# Patient Record
Sex: Male | Born: 1975 | Race: Black or African American | Hispanic: No | Marital: Single | Smoking: Never smoker
Health system: Southern US, Community
[De-identification: ages and names within clinical notes are randomized; demographics above are authoritative.]

## PROBLEM LIST (undated history)

## (undated) DIAGNOSIS — L309 Dermatitis, unspecified: Secondary | ICD-10-CM

## (undated) DIAGNOSIS — S92902A Unspecified fracture of left foot, initial encounter for closed fracture: Secondary | ICD-10-CM

## (undated) DIAGNOSIS — G43909 Migraine, unspecified, not intractable, without status migrainosus: Secondary | ICD-10-CM

## (undated) DIAGNOSIS — Z91018 Allergy to other foods: Secondary | ICD-10-CM

## (undated) DIAGNOSIS — Z9109 Other allergy status, other than to drugs and biological substances: Secondary | ICD-10-CM

## (undated) DIAGNOSIS — J45909 Unspecified asthma, uncomplicated: Secondary | ICD-10-CM

## (undated) DIAGNOSIS — R011 Cardiac murmur, unspecified: Secondary | ICD-10-CM

## (undated) HISTORY — DX: Migraine, unspecified, not intractable, without status migrainosus: G43.909

## (undated) HISTORY — DX: Dermatitis, unspecified: L30.9

## (undated) HISTORY — DX: Unspecified fracture of left foot, initial encounter for closed fracture: S92.902A

---
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
## 1999-10-31 ENCOUNTER — Emergency Department (HOSPITAL_COMMUNITY): Admission: EM | Admit: 1999-10-31 | Discharge: 1999-10-31 | Payer: Self-pay | Admitting: Emergency Medicine

## 1999-11-01 ENCOUNTER — Encounter: Payer: Self-pay | Admitting: Emergency Medicine

## 1999-11-07 ENCOUNTER — Encounter: Payer: Self-pay | Admitting: Emergency Medicine

## 1999-11-07 ENCOUNTER — Emergency Department (HOSPITAL_COMMUNITY): Admission: EM | Admit: 1999-11-07 | Discharge: 1999-11-07 | Payer: Self-pay | Admitting: Emergency Medicine

## 2000-09-26 ENCOUNTER — Inpatient Hospital Stay (HOSPITAL_COMMUNITY): Admission: EM | Admit: 2000-09-26 | Discharge: 2000-09-28 | Payer: Self-pay

## 2000-10-07 ENCOUNTER — Encounter: Admission: RE | Admit: 2000-10-07 | Discharge: 2000-10-07 | Payer: Self-pay

## 2001-05-28 ENCOUNTER — Emergency Department (HOSPITAL_COMMUNITY): Admission: EM | Admit: 2001-05-28 | Discharge: 2001-05-28 | Payer: Self-pay | Admitting: Emergency Medicine

## 2001-05-28 ENCOUNTER — Encounter: Payer: Self-pay | Admitting: Emergency Medicine

## 2003-02-19 ENCOUNTER — Emergency Department (HOSPITAL_COMMUNITY): Admission: EM | Admit: 2003-02-19 | Discharge: 2003-02-20 | Payer: Self-pay | Admitting: Emergency Medicine

## 2003-02-20 ENCOUNTER — Encounter: Payer: Self-pay | Admitting: Emergency Medicine

## 2004-04-10 ENCOUNTER — Emergency Department (HOSPITAL_COMMUNITY): Admission: EM | Admit: 2004-04-10 | Discharge: 2004-04-10 | Payer: Self-pay | Admitting: Emergency Medicine

## 2004-08-09 ENCOUNTER — Emergency Department (HOSPITAL_COMMUNITY): Admission: EM | Admit: 2004-08-09 | Discharge: 2004-08-09 | Payer: Self-pay | Admitting: *Deleted

## 2007-03-31 ENCOUNTER — Emergency Department (HOSPITAL_COMMUNITY): Admission: EM | Admit: 2007-03-31 | Discharge: 2007-03-31 | Payer: Self-pay | Admitting: Family Medicine

## 2007-11-05 ENCOUNTER — Emergency Department (HOSPITAL_COMMUNITY): Admission: EM | Admit: 2007-11-05 | Discharge: 2007-11-06 | Payer: Self-pay | Admitting: Orthopedic Surgery

## 2007-12-20 ENCOUNTER — Emergency Department (HOSPITAL_COMMUNITY): Admission: EM | Admit: 2007-12-20 | Discharge: 2007-12-20 | Payer: Self-pay | Admitting: Emergency Medicine

## 2010-11-12 ENCOUNTER — Inpatient Hospital Stay (INDEPENDENT_AMBULATORY_CARE_PROVIDER_SITE_OTHER)
Admission: RE | Admit: 2010-11-12 | Discharge: 2010-11-12 | Disposition: A | Discharge status: Home / Self Care | Payer: Self-pay | Source: Ambulatory Visit | Attending: Family Medicine | Admitting: Family Medicine

## 2010-11-12 ENCOUNTER — Ambulatory Visit (INDEPENDENT_AMBULATORY_CARE_PROVIDER_SITE_OTHER): Payer: Self-pay

## 2010-11-12 DIAGNOSIS — S92309A Fracture of unspecified metatarsal bone(s), unspecified foot, initial encounter for closed fracture: Secondary | ICD-10-CM

## 2010-12-29 ENCOUNTER — Inpatient Hospital Stay (INDEPENDENT_AMBULATORY_CARE_PROVIDER_SITE_OTHER)
Admission: RE | Admit: 2010-12-29 | Discharge: 2010-12-29 | Disposition: A | Discharge status: Home / Self Care | Payer: Self-pay | Source: Ambulatory Visit | Attending: Emergency Medicine | Admitting: Emergency Medicine

## 2010-12-29 DIAGNOSIS — L259 Unspecified contact dermatitis, unspecified cause: Secondary | ICD-10-CM

## 2010-12-29 DIAGNOSIS — J45909 Unspecified asthma, uncomplicated: Secondary | ICD-10-CM

## 2011-03-26 LAB — POCT I-STAT, CHEM 8
HCT: 45
Hemoglobin: 15.3
Potassium: 3.3 — ABNORMAL LOW
Sodium: 142
TCO2: 27

## 2011-03-26 LAB — CBC
Hemoglobin: 13.6
RBC: 5.22
WBC: 12.4 — ABNORMAL HIGH

## 2011-03-26 LAB — DIFFERENTIAL
Basophils Relative: 1
Lymphocytes Relative: 43
Monocytes Absolute: 1
Monocytes Relative: 8
Neutro Abs: 5.5
Neutrophils Relative %: 44

## 2011-03-26 LAB — PROTIME-INR: INR: 0.9

## 2011-03-26 LAB — SAMPLE TO BLOOD BANK

## 2011-03-26 LAB — APTT: aPTT: 23 — ABNORMAL LOW

## 2011-12-05 ENCOUNTER — Encounter (HOSPITAL_COMMUNITY): Payer: Self-pay | Admitting: *Deleted

## 2011-12-05 ENCOUNTER — Emergency Department (INDEPENDENT_AMBULATORY_CARE_PROVIDER_SITE_OTHER)
Admission: EM | Admit: 2011-12-05 | Discharge: 2011-12-05 | Disposition: A | Discharge status: Home / Self Care | Payer: Self-pay | Source: Home / Self Care | Attending: Emergency Medicine | Admitting: Emergency Medicine

## 2011-12-05 DIAGNOSIS — Z760 Encounter for issue of repeat prescription: Secondary | ICD-10-CM

## 2011-12-05 DIAGNOSIS — L301 Dyshidrosis [pompholyx]: Secondary | ICD-10-CM

## 2011-12-05 HISTORY — DX: Unspecified asthma, uncomplicated: J45.909

## 2011-12-05 MED ORDER — TRIAMCINOLONE ACETONIDE 0.5 % EX OINT: TOPICAL_OINTMENT | Freq: Two times a day (BID) | CUTANEOUS | Status: DC

## 2011-12-05 MED ORDER — ALBUTEROL SULFATE HFA 108 (90 BASE) MCG/ACT IN AERS: 2.0000 | INHALATION_SPRAY | Freq: Four times a day (QID) | RESPIRATORY_TRACT | Status: DC | PRN

## 2011-12-05 MED ORDER — CEPHALEXIN 500 MG PO CAPS: 500.0000 mg | ORAL_CAPSULE | Freq: Four times a day (QID) | ORAL | Status: AC

## 2011-12-05 MED ORDER — METHYLPREDNISOLONE 4 MG PO KIT: PACK | ORAL | Status: AC

## 2011-12-05 MED ORDER — HYDROXYZINE HCL 50 MG PO TABS: 50.0000 mg | ORAL_TABLET | Freq: Three times a day (TID) | ORAL | Status: AC | PRN

## 2011-12-05 NOTE — ED Provider Notes (Signed)
History     CSN: [id number]  Arrival date & time 12/05/11  1154   First MD Initiated Contact with Patient 12/05/11 1241      Chief Complaint  Patient presents with  . Rash    (Consider location/radiation/quality/duration/timing/severity/associated sxs/prior treatment) HPI Comments: Patient history of eczema presents with worsening of his chronic, itchy hand rash. States that initially started on his right hand, but has now spread to his left. 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 He washes dishes for living, but dates that this does not make it better or worse. States that the itching feels better when soaking his hands in very hot water. He is not tried anything else for his symptoms. No nausea, vomiting, redness, paresthesias, limitation of motion of the fingers of his hand. He has had similar, silvery plaques on his elbows since childhood. Does have a history of eczema and asthma. He's also requesting a refill of albuterol inhaler.   ROS as noted in HPI. All other ROS negative.   Patient is a 36 y.o. male presenting with rash. The history is provided by the patient. No language interpreter was used.  Rash  This is a chronic problem. The current episode started more than 1 week ago. The problem has been gradually worsening. The problem is associated with an unknown factor. There has been no fever. The rash is present on the left hand and right hand. Associated symptoms include itching. Pertinent negatives include no blisters, no pain and no weeping. He has tried steriods for the symptoms. The treatment provided moderate relief.    Past Medical History  Diagnosis Date  . Asthma     History reviewed. No pertinent past surgical history.  History reviewed. No pertinent family history.  History  Substance Use Topics  .  Smoking status: Current Some Day Smoker  . Smokeless tobacco: Not on file  . Alcohol Use: No      Review of Systems  Skin: Positive for itching and rash.    Allergies  Review of patient's allergies indicates no known allergies.  Home Medications   Current Outpatient Rx  Name Route Sig Dispense Refill  . ALBUTEROL SULFATE HFA 108 (90 BASE) MCG/ACT IN AERS Inhalation Inhale 2 puffs into the lungs every 6 (six) hours as needed. 1 Inhaler 0  . CEPHALEXIN 500 MG PO CAPS Oral Take 1 capsule (500 mg total) by mouth 4 (four) times daily. X 10 days 40 capsule 0  . HYDROXYZINE HCL 50 MG PO TABS Oral Take 1 tablet (50 mg total) by mouth 3 (three) times daily as needed for itching. 30 tablet 0  . METHYLPREDNISOLONE 4 MG PO KIT  follow package directions 21 tablet 0  . TRIAMCINOLONE ACETONIDE 0.5 % EX OINT Topical Apply topically 2 (two) times daily. X 2 weeks 30 g 0    BP 124/70  Pulse 72  Temp(Src) 98.6 F (37 C) (Oral)  Resp 16  SpO2 100%  Physical Exam  Nursing note and vitals reviewed. Constitutional: He is oriented to person, place, and time. He appears well-developed and well-nourished.  HENT:  Head: Normocephalic and atraumatic.  Eyes: Conjunctivae and EOM are normal.  Neck: Normal range of motion.  Cardiovascular: Normal rate.   Pulmonary/Chest: Effort normal. No respiratory distress.  Abdominal: He exhibits no distension.  Musculoskeletal: Normal range of motion.  Neurological:  He is alert and oriented to person, place, and time.  Skin: Skin is warm and dry. Rash noted.       Cracked, silvery, scaly skin on the ring and little finger, similar patches on left hand.No signs of infection on the hands. No redness, purulent drainage. No limitation of motion. Sensation grossly intact. Refill less than 2 seconds. Silvery patches c/w ezcema on the forearms, neck. No excoriations.   Psychiatric: He has a normal mood and affect. His behavior is normal.    ED Course  Procedures  (including critical care time)  Labs Reviewed - No data to display No results found.   1. Dyshidrotic eczema   2. Medication refill       MDM  Previous records are being. As noted in history of present illness.  Pt to use thin cotton gloves when wearing gloves. He is to stop soaking hands in hot water. He home with triamcinolone, oral steroids and antihistamines for the itching, Keflex for secondary infection. Also refilling albuterol.  Luiz Blare, MD 12/05/11 2040

## 2011-12-05 NOTE — Discharge Instructions (Signed)
We're thin cotton gloves when you're wearing rubber gloves. You must keep your hands as dry as you can stop soaking your hands in hot water as discussed. Take the medication as written. You will need to see a dermatologist for ongoing management. Return if you have a fever above 100.4, fevers, or other concerns.

## 2011-12-05 NOTE — ED Notes (Signed)
Pt  Has  Dry    Cracked  Skin     Both  Hands    r  Worse  Than  The  Left  -  Pt  Has   ecxyma     In past  Seen   Last  Month  For  Same  Given  Cream  Not  Helping  Pt  Reports  That  He  Also  Has  Asthma   And  Needs a  rx  For  Albuterol       -  He  Is  Sitting  Upright on the  Exam table      In no  Severe  Distress

## 2012-01-13 ENCOUNTER — Encounter: Payer: Self-pay | Admitting: Sports Medicine

## 2012-01-13 ENCOUNTER — Ambulatory Visit (INDEPENDENT_AMBULATORY_CARE_PROVIDER_SITE_OTHER): Payer: Self-pay | Admitting: Sports Medicine

## 2012-01-13 VITALS — BP 124/75 | HR 83 | Temp 97.8°F | Ht 71.0 in | Wt 250.1 lb

## 2012-01-13 DIAGNOSIS — J45909 Unspecified asthma, uncomplicated: Secondary | ICD-10-CM | POA: Insufficient documentation

## 2012-01-13 DIAGNOSIS — L301 Dyshidrosis [pompholyx]: Secondary | ICD-10-CM

## 2012-01-13 DIAGNOSIS — Z0000 Encounter for general adult medical examination without abnormal findings: Secondary | ICD-10-CM

## 2012-01-13 DIAGNOSIS — D179 Benign lipomatous neoplasm, unspecified: Secondary | ICD-10-CM

## 2012-01-13 MED ORDER — TRIAMCINOLONE ACETONIDE 0.5 % EX OINT: TOPICAL_OINTMENT | Freq: Two times a day (BID) | CUTANEOUS | Status: DC

## 2012-01-13 MED ORDER — FLUOCINOLONE ACETONIDE 0.01 % EX OIL: TOPICAL_OIL | Freq: Two times a day (BID) | CUTANEOUS | Status: DC

## 2012-01-13 MED ORDER — ALBUTEROL SULFATE HFA 108 (90 BASE) MCG/ACT IN AERS: 2.0000 | INHALATION_SPRAY | Freq: Four times a day (QID) | RESPIRATORY_TRACT | Status: DC | PRN

## 2012-01-13 MED ORDER — AEROCHAMBER MAX W/MASK LARGE MISC: 1.0000 | Freq: Once | Status: DC

## 2012-01-13 MED ORDER — BECLOMETHASONE DIPROPIONATE 80 MCG/ACT IN AERS: 1.0000 | INHALATION_SPRAY | RESPIRATORY_TRACT | Status: DC | PRN

## 2012-01-13 MED ORDER — FLUOCINOLONE ACETONIDE 0.01 % EX OIL: TOPICAL_OIL | Freq: Two times a day (BID) | CUTANEOUS | Status: AC

## 2012-01-13 NOTE — Patient Instructions (Addendum)
It was nice to meet you today.  The growths are called lipomas.  They are a overgrowth of fat cells that are benign (non-cancerous).  I have sent in 2 steroids for your hands.  Only use one of these but you may try both to see which you prefer.  For your asthma I have sent in a new prescription for a controller medicine as well as a refill for your albuterol.  Please use a spacer to take these medications each time you use them.  Please return in 1-2 weeks to make a lab appointment.  Please call the day before you would like to come in to schedule a lab draw.  Do not eat anything after midnight the night before; you may drink only water prior to the blood draw.  Please schedule a follow up appointment in 1-2 months to check on how your asthma is doing and to discuss the results of your lab work.

## 2012-01-22 ENCOUNTER — Telehealth: Payer: Self-pay | Admitting: Sports Medicine

## 2012-01-22 DIAGNOSIS — L301 Dyshidrosis [pompholyx]: Secondary | ICD-10-CM

## 2012-01-22 DIAGNOSIS — J45909 Unspecified asthma, uncomplicated: Secondary | ICD-10-CM

## 2012-01-22 NOTE — Telephone Encounter (Signed)
Called pt to find out which Rx's are to be sent and he is requesting that all of them are sent to [facility]. These will have to be faxed or called in. Forwarded to pcp.Loralee Pacas [place]

## 2012-01-22 NOTE — Telephone Encounter (Signed)
Pt recently got [employer], now wants all his prescriptions sent to [facility].

## 2012-01-25 ENCOUNTER — Encounter: Payer: Self-pay | Admitting: Sports Medicine

## 2012-01-25 DIAGNOSIS — D179 Benign lipomatous neoplasm, unspecified: Secondary | ICD-10-CM | POA: Insufficient documentation

## 2012-01-25 NOTE — Assessment & Plan Note (Signed)
Will represcribed steroids for hand and arm eczema.  Encourage patient to have a good moisturizing routine

## 2012-01-25 NOTE — Assessment & Plan Note (Addendum)
Moderate persistent asthma and adult.  Will prescribe controller medicine with a spacer, red flax reviewed

## 2012-01-25 NOTE — Assessment & Plan Note (Signed)
Discussed red flax.  Lesions or not changing per patient report.  Nonpainful.

## 2012-01-25 NOTE — Progress Notes (Signed)
  [facility]  Patient name: Adrian [name] MRN [id number]  Date of birth: [birth year]  CC & HPI  Adrian [name] is a 36 y.o. male presenting today to establish care as an outpatient.  History to establish with the orange card and was referred by Rudell Cobb.   He was referred for severe headaches, eczema and asthma.  Yes reports that he is then more anxious in his mood has been depressed.  Denies any homicidal or suicidal ideations and wishes to defer medications at this time.  Reports severe itching of his hands that required evaluation the urgent care.  Reports a history of asthma and has wheezing frequently, coughing frequently, tightness approximately every day.  He denies is waking her up from sleep.  Uses an albuterol inhaler daily when he has one but is currently out.  History has small bumps underneath the skin that he would like to be evaluated and reports occasional palpitations that he has been struggling with for prolonged time.  He reports these are worse with his anxiety.   ROS  Per history of present illness  Pertinent History Reviewed  Medical & Surgical Hx:  Reviewed: Significant for asthma, migraines, palpitations Medications: Reviewed & Updated - see associated section Social History: Reviewed and updated - see section  Objective Findings  Vitals:  Filed Vitals:   01/13/12 1347  BP: 124/75  Pulse: 83  Temp: 97.8 F (36.6 C)    PE: GENERAL:  Adult  male.  Examined in [facility].  In no discomfort; norespiratory distress.   PSYCH: Alert and appropriately interactive; Insight:Good   GAD 7 - Difficulty: Very difficult 1 2 3 4 5 6 7  Total:  2 3 3 3 2 3 3 19   PHQ-9 - Difficulty: Very Difficult 1 2 3 4 5 6 7 8 9  Total:  2 3 3 3 1 2 1 1 2 18       H&N: AT/[place], MMM, no scleral icterus, EOMi THORAX: HEART: RRR, S1/S2 heard, no murmur LUNGS: CTA B, no wheezes, no crackles EXTREMITIES: Moves all 4 extremities spontaneously, warm well perfused, no  edema, bilateral DP and PT pulses 2/4.   Skin: Scattered subcutaneous nodules approximately half a centimeter to 1 cm of of his forearm and upper back.  Consistent with lipomas    Assessment & Plan

## 2012-01-25 NOTE — Assessment & Plan Note (Signed)
Maintenance labs drawn

## 2012-01-26 ENCOUNTER — Telehealth: Payer: Self-pay | Admitting: *Deleted

## 2012-01-26 MED ORDER — BECLOMETHASONE DIPROPIONATE 80 MCG/ACT IN AERS: 1.0000 | INHALATION_SPRAY | RESPIRATORY_TRACT | Status: AC | PRN

## 2012-01-26 MED ORDER — ALBUTEROL SULFATE HFA 108 (90 BASE) MCG/ACT IN AERS: 2.0000 | INHALATION_SPRAY | Freq: Four times a day (QID) | RESPIRATORY_TRACT | Status: DC | PRN

## 2012-01-26 MED ORDER — AEROCHAMBER MAX W/MASK LARGE MISC: 1.0000 | Freq: Once | Status: DC

## 2012-01-26 MED ORDER — TRIAMCINOLONE ACETONIDE 0.5 % EX OINT: TOPICAL_OINTMENT | Freq: Two times a day (BID) | CUTANEOUS | Status: DC

## 2012-01-26 MED ORDER — TRIAMCINOLONE 0.1 % CREAM:EUCERIN CREAM 1:1: 1.0000 "application " | TOPICAL_CREAM | Freq: Two times a day (BID) | CUTANEOUS | Status: DC

## 2012-01-26 NOTE — Telephone Encounter (Signed)
[facility] pharmacy calling about patient's triamcinolone ointment Rx.  Patient has Rx at [facility] and is requesting to pick med up from [facility] pharmacy, but they only have 0.1% cream.  If ok to change, patient will need new Rx called or faxed to their pharmacy.  Will route note to Dr. Berline Chough and call back.  Gaylene Brooks, RN

## 2012-01-26 NOTE — Telephone Encounter (Signed)
Reprinted and sent with other rx

## 2012-01-26 NOTE — Telephone Encounter (Signed)
Reprinted.  Will have faxed

## 2012-05-19 ENCOUNTER — Ambulatory Visit (INDEPENDENT_AMBULATORY_CARE_PROVIDER_SITE_OTHER): Payer: Self-pay | Admitting: Family Medicine

## 2012-05-19 ENCOUNTER — Encounter: Payer: Self-pay | Admitting: Family Medicine

## 2012-05-19 ENCOUNTER — Ambulatory Visit: Payer: Self-pay | Admitting: Family Medicine

## 2012-05-19 VITALS — BP 127/77 | HR 56 | Temp 99.5°F | Ht 71.0 in | Wt 252.0 lb

## 2012-05-19 DIAGNOSIS — K089 Disorder of teeth and supporting structures, unspecified: Secondary | ICD-10-CM

## 2012-05-19 DIAGNOSIS — L301 Dyshidrosis [pompholyx]: Secondary | ICD-10-CM

## 2012-05-19 DIAGNOSIS — J45909 Unspecified asthma, uncomplicated: Secondary | ICD-10-CM

## 2012-05-19 DIAGNOSIS — K0889 Other specified disorders of teeth and supporting structures: Secondary | ICD-10-CM | POA: Insufficient documentation

## 2012-05-19 MED ORDER — TRIAMCINOLONE ACETONIDE 0.1 % EX OINT: TOPICAL_OINTMENT | Freq: Two times a day (BID) | CUTANEOUS | Status: DC

## 2012-05-19 MED ORDER — ALBUTEROL SULFATE HFA 108 (90 BASE) MCG/ACT IN AERS: 2.0000 | INHALATION_SPRAY | Freq: Four times a day (QID) | RESPIRATORY_TRACT | Status: DC | PRN

## 2012-05-19 NOTE — Assessment & Plan Note (Signed)
No evidence of infection.  Will place referral for chipped tooth.

## 2012-05-19 NOTE — Patient Instructions (Addendum)
Will put in a dental referral.  May take a while  Brush teeth 2-3 times daily and floss every day

## 2012-05-19 NOTE — Assessment & Plan Note (Signed)
Will refill triamcinolone 

## 2012-05-19 NOTE — Assessment & Plan Note (Signed)
Well controlled.  Will refill albuterol.

## 2012-05-19 NOTE — Progress Notes (Signed)
  Subjective:    Patient ID: Adrian [name], male    DOB: Nov 13, 1975, 36 y.o.   MRN: [id number]  HPI  Work in appt for dental pain  Dental pain:  Several days ago chipped front bottom tooth on the inside.  Has been sore and notes his tongue gets caught on sharp edge.  No fever, chills, drainage, swelling.  Eczema: needs refill of steroid cream.  Had been doing well before he ran out  Asthma:  Requests refill of albuterols.  Reports being compliant qiht qvar, needing albuterol only in limited use.  No tobacco use.  occ mj use.  I have reviewed patient's  PMH, FH, and Social history and Medications as related to this visit. Denies smoking Review of Systems See hpi    Objective:   Physical Exam GEN: Alert & Oriented, No acute distress CV:  Regular Rate & Rhythm, no murmur Respiratory:  Normal work of breathing, CTAB Abd:  + BS, soft, no tenderness to palpation Ext: no pre-tibial edema Tooth;  Small crack medial portion of lower front tooth on the back.  No gum swelling.  No evidence of pulpitis.       Assessment & Plan:

## 2013-02-15 ENCOUNTER — Emergency Department (INDEPENDENT_AMBULATORY_CARE_PROVIDER_SITE_OTHER)
Admission: EM | Admit: 2013-02-15 | Discharge: 2013-02-15 | Disposition: A | Discharge status: Home / Self Care | Payer: Self-pay | Source: Home / Self Care | Attending: Emergency Medicine | Admitting: Emergency Medicine

## 2013-02-15 ENCOUNTER — Encounter (HOSPITAL_COMMUNITY): Payer: Self-pay | Admitting: *Deleted

## 2013-02-15 DIAGNOSIS — S0550XA Penetrating wound with foreign body of unspecified eyeball, initial encounter: Secondary | ICD-10-CM

## 2013-02-15 DIAGNOSIS — T1511XA Foreign body in conjunctival sac, right eye, initial encounter: Secondary | ICD-10-CM

## 2013-02-15 MED ORDER — POLYMYXIN B-TRIMETHOPRIM 10000-0.1 UNIT/ML-% OP SOLN: 1.0000 [drp] | OPHTHALMIC | Status: DC

## 2013-02-15 NOTE — ED Notes (Signed)
Pt  Reports  He  Was   Working  With  Fiberglass    sev  Days  Ago  And  May  Have  Gotten  Something in r  Eye  -  It is  Red  painfull  And irritated  He  Reports  He  Attempted  To  Irrigate the  Eye  With  Visine  But it  Hurt too  Much

## 2013-02-15 NOTE — ED Provider Notes (Signed)
Chief Complaint:   Chief Complaint  Patient presents with  . Eye Problem    History of Present Illness:   Adrian [name] is a 37 year old male who was working with some fiberglass several days ago when he thinks he got some fiberglass in his eye. This was his right eye. He has a foreign body sensation in that eye. His vision is somewhat blurry. The eye has been red and watering it feels irritated. He flushed his eye out copiously with lukewarm water and tried some Visine drops, but neither these did much good.  Review of Systems:  Other than noted above, the patient denies any of the following symptoms: Systemic:  No fever, chills, sweats, fatigue, or weight loss. Eye:  No redness, eye pain, photophobia, discharge, blurred vision, or diplopia. ENT:  No nasal congestion, rhinorrhea, or sore throat. Lymphatic:  No adenopathy. Skin:  No rash or pruritis.  PMFSH:  Past medical history, family history, social history, meds, and allergies were reviewed.  He has asthma and eczema and uses albuterol and triamcinolone.  Physical Exam:   Vital signs:  BP 125/84  Pulse 45  Temp(Src) 97.2 F (36.2 C) (Oral)  Resp 16  SpO2 98% General:  Alert and in no distress. Eye:  Lids appear normal. Conjunctiva was not injected. Conjunctival sac was searched extensively for foreign body and none was found. The upper lid was everted and appears normal. Cornea was intact to inspection and to fluorescein staining. Anterior chamber was normal. Lobe appeared normal. PERRLA, full EOMs, fundi benign. ENT:  TMs and canals clear.  Nasal mucosa normal.  No intra-oral lesions, mucous membranes moist, pharynx clear. Neck:  No adenopathy tenderness or mass. Skin:  Clear, warm and dry.  Assessment:  The encounter diagnosis was Foreign body in conjunctival sac, right, initial encounter.  No evidence of foreign body. I think he may have gotten some fiberglass in his eye but his tears washed it out. I suggest he followup with  ophthalmology if symptoms persist.  Plan:   1.  The following meds were prescribed:   Discharge Medication List as of 02/15/2013  7:27 PM    START taking these medications   Details  trimethoprim-polymyxin b (POLYTRIM) ophthalmic solution Place 1 drop into the right eye every 4 (four) hours., Starting 02/15/2013, Until Discontinued, Normal       2.  The patient was instructed in symptomatic care and handouts were given. 3.  The patient was told to return if becoming worse in any way, if no better in 2 or 3 days, and given some red flag symptoms such as ongoing eye discomfort or changes in his vision that would indicate earlier return. 4.  Follow up with Dr. Wayna Chalet if no better in 2 or 3 days.     Reuben Likes, MD 02/15/13 [id number]

## 2013-02-15 NOTE — Discharge Instructions (Signed)
Eye Foreign Body It is common during physical activity for a foreign body to enter and stay in the eye. This may result in scratching (abrasion) of the eye, vision loss, eye pain, or the sensation of an object in the eye, causing teardrops to develop. Wearing eye protection significantly decreases the chance of a foreign body entering the eye. However, athletes often neglect to wear eye protection. SYMPTOMS   Irritation when blinking.  Decreased vision.  Eye pain.  Swelling of eye lids. CAUSES   Contact between finger and the eye.  Contact between athletic equipment and the eye.  Dirt and grass in the eye.  Windblown debris in the eye.  Insect in the eye. RISK INCREASES WITH:  Elvin So and dusty conditions.  Lack of protective eye gear.  Lack of protective headgear, including face masks. PREVENTION   Avoid use of guns or toys that release projectiles or air.  Wear protective eyewear, such as polycarbonate lenses.  Wear protective headgear. TREATMENT  Before seeking medical attention, you may flush the eyes out with lukewarm water. This may flush the foreign body out of the eye. If flushing does not work, you need to see a caregiver for a thorough eye exam. The caregiver will examine your eye for a foreign body, and for the possibility of a penetrating body within the globe of the eye. If your caregiver is not able to remove the foreign body in his or her office, surgery may be necessary. Finally, antibiotics and anti-inflammatory drops may be prescribed. Depending on the injury, a rigid shield or eye patch may be applied.  Document Released: 06/16/2005 Document Revised: 09/08/2011 Document Reviewed: 09/28/2008 [facility] Patient Information 2014 [place], [place].

## 2013-04-20 ENCOUNTER — Encounter (HOSPITAL_COMMUNITY): Payer: Self-pay | Admitting: Emergency Medicine

## 2013-04-20 ENCOUNTER — Emergency Department (INDEPENDENT_AMBULATORY_CARE_PROVIDER_SITE_OTHER)
Admission: EM | Admit: 2013-04-20 | Discharge: 2013-04-20 | Disposition: A | Discharge status: Home / Self Care | Payer: Self-pay | Source: Home / Self Care

## 2013-04-20 DIAGNOSIS — J029 Acute pharyngitis, unspecified: Secondary | ICD-10-CM

## 2013-04-20 DIAGNOSIS — J45909 Unspecified asthma, uncomplicated: Secondary | ICD-10-CM

## 2013-04-20 HISTORY — DX: Allergy to other foods: Z91.018

## 2013-04-20 HISTORY — DX: Other allergy status, other than to drugs and biological substances: Z91.09

## 2013-04-20 MED ORDER — AZITHROMYCIN 250 MG PO TABS: ORAL_TABLET | ORAL | Status: DC

## 2013-04-20 MED ORDER — POCKET SPACER DEVI: 1.0000 | Freq: Once | Status: DC

## 2013-04-20 MED ORDER — ALBUTEROL SULFATE (5 MG/ML) 0.5% IN NEBU
2.5000 mg | INHALATION_SOLUTION | Freq: Once | RESPIRATORY_TRACT | Status: AC
  Administered 2013-04-20: 2.5 mg via RESPIRATORY_TRACT

## 2013-04-20 MED ORDER — ALBUTEROL SULFATE HFA 108 (90 BASE) MCG/ACT IN AERS: 2.0000 | INHALATION_SPRAY | Freq: Four times a day (QID) | RESPIRATORY_TRACT | Status: DC | PRN

## 2013-04-20 MED ORDER — IPRATROPIUM BROMIDE 0.02 % IN SOLN
0.5000 mg | RESPIRATORY_TRACT | Status: DC
  Administered 2013-04-20: 0.5 mg via RESPIRATORY_TRACT

## 2013-04-20 MED ORDER — ALBUTEROL SULFATE (5 MG/ML) 0.5% IN NEBU
INHALATION_SOLUTION | RESPIRATORY_TRACT | Status: AC
  Filled 2013-04-20: qty 1

## 2013-04-20 MED ORDER — SODIUM CHLORIDE 0.9 % IJ SOLN
INTRAMUSCULAR | Status: AC
  Filled 2013-04-20: qty 3

## 2013-04-20 MED ORDER — IPRATROPIUM BROMIDE 0.02 % IN SOLN
RESPIRATORY_TRACT | Status: AC
  Filled 2013-04-20: qty 2.5

## 2013-04-20 NOTE — ED Notes (Signed)
Post peak flow-560,490,500- 560 best of 3.

## 2013-04-20 NOTE — ED Notes (Addendum)
C/o sore throat onset Fri., then he started coughing, runny nose, fatigue-like he's going to pass out, chills, fever, chest tight. States he has asthma and feels like an asthma attack is coming on. Has used his Albuteral inhaler.  Cough is productive of yellowish brown sputum.  Pt. audibly wheezing on expiration.

## 2013-04-20 NOTE — ED Notes (Signed)
Pre tx. 360, 345, 320- 360 best of 3.

## 2013-04-20 NOTE — ED Provider Notes (Signed)
CSN: [id number]     Arrival date & time 04/20/13  1823 History   None    Chief Complaint  Patient presents with  . Influenza   HPI 37 year male with known history of moderate asthma, ran out of inhalers, presents with five-day history since 04/16/13 of significant cough, coryza, myalgias, mild sputum. He states that he has small children at home but none of them are sick-his girlfriend coughed his face and he thinks that he may contracted this from her. She has been sick for 2 weeks and is going [facility] the [facility] to [facility] to get evaluated. He does not have your chart anymore as is unemployed and hence came here for medical care. He states that he has had no ear pain either nor any headache or chest pain. He does feel short of breath and feels occasionally like his has a because he cannot catch his breath.   Past Medical History  Diagnosis Date  . Asthma   . Eczema   . Migraine   . Foot fracture, left   . Multiple food allergies     fish, tomatoes, peanuts, eggs, chicken and others  . Environmental allergies     grass   Past Surgical History  Procedure Laterality Date  . Inguinal hernia repair  1994   Family History  Problem Relation Age of Onset  . Asthma Father   . Clotting disorder      Aunt  . Breast cancer      Grandmother  . Prostate cancer      uncle  . Diabetes Mother   . Hypertension Mother   . Hyperlipidemia      grandparent  . Hypertension      grandparent/grandparent  . Kidney disease      aunt/uncle/grandparent  . Stroke      grandparent   History  Substance Use Topics  . Smoking status: Current Some Day Smoker    Types: Cigars  . Smokeless tobacco: Not on file  . Alcohol Use: No    Review of Systems See above in addition no chest pain no nausea no vomiting Positive diarrhea couple of episodes couple of days ago.   Allergies  Review of patient's allergies indicates no known allergies.  Home Medications   Current Outpatient  Rx  Name  Route  Sig  Dispense  Refill  . albuterol (PROVENTIL HFA;VENTOLIN HFA) 108 (90 BASE) MCG/ACT inhaler   Inhalation   Inhale 2 puffs into the lungs every 6 (six) hours as needed.   1 Inhaler   2   . triamcinolone ointment (KENALOG) 0.1 %   Topical   Apply topically 2 (two) times daily. As needed   454 g   0   . albuterol (PROVENTIL HFA;VENTOLIN HFA) 108 (90 BASE) MCG/ACT inhaler   Inhalation   Inhale 2 puffs into the lungs every 6 (six) hours as needed.   1 Inhaler   1   . Spacer/Aero-Holding Chambers (AEROCHAMBER MAX [facility]) MISC   Other   1 each by Other route once.   1 each   3   . trimethoprim-polymyxin b (POLYTRIM) ophthalmic solution   Right Eye   Place 1 drop into the right eye every 4 (four) hours.   10 mL   0    There were no vitals taken for this visit. Physical Exam Alert obese pleasant African American male in mild respiratory distress. No accessory muscle use EOMI, funduscopy deferred Throat seems slightly injected Ears  are clear with TMs having no air-fluid levels No submandibular lymphadenopathy Chest has wheezes bilaterally No tactile vocal resonance/fremitus Abdomen soft nontender  ED Course  Procedures (including critical care time) Labs Review Labs Reviewed - No data to display Imaging Review No results found.  EKG Interpretation     Ventricular Rate:    PR Interval:    QRS Duration:   QT Interval:    QTC Calculation:   R Axis:     Text Interpretation:              MDM  No diagnosis found. Long detailed explanation explained to the patient that this is probably viral pharyngitis, not the flu and that he would probably not benefit from Tamiflu nor antibiotics-patient insists that he wishes antibiotics. I am happy to prescribe him a Z-Pak if he so desires and we will also refill his albuterol as he has run out. I mentioned to him that he will need to have a spacer for use of the albuterol, He also may benefit  from a peak flow meter at home which are prescribed He will benefit from [facility] followup  Rhetta Mura, MD 04/20/13 1947

## 2013-08-03 ENCOUNTER — Emergency Department (INDEPENDENT_AMBULATORY_CARE_PROVIDER_SITE_OTHER)
Admission: EM | Admit: 2013-08-03 | Discharge: 2013-08-03 | Disposition: A | Discharge status: Home / Self Care | Payer: Self-pay | Source: Home / Self Care | Attending: Emergency Medicine | Admitting: Emergency Medicine

## 2013-08-03 ENCOUNTER — Encounter (HOSPITAL_COMMUNITY): Payer: Self-pay | Admitting: Emergency Medicine

## 2013-08-03 DIAGNOSIS — J069 Acute upper respiratory infection, unspecified: Secondary | ICD-10-CM

## 2013-08-03 LAB — POCT RAPID STREP A: Streptococcus, Group A Screen (Direct): NEGATIVE

## 2013-08-03 MED ORDER — GUAIFENESIN 100 MG/5ML PO SYRP: 100.0000 mg | ORAL_SOLUTION | ORAL | Status: DC | PRN

## 2013-08-03 MED ORDER — PSEUDOEPHEDRINE HCL 60 MG PO TABS
60.0000 mg | ORAL_TABLET | Freq: Three times a day (TID) | ORAL | Status: DC | PRN
Start: 2013-08-03 — End: 2013-08-27

## 2013-08-03 NOTE — ED Notes (Signed)
Pt c/o cold sxs onset Sunday Sxs include: runny nose, ST, chills, BA, diarrhea Hx of asthma... Has been using albuterol Denies f/v/n, SOB, wheezing Alert w/no signs of acute distress.

## 2013-08-03 NOTE — ED Provider Notes (Signed)
CSN: [id number]     Arrival date & time 08/03/13  4967 History   First MD Initiated Contact with Patient 08/03/13 0932     Chief Complaint  Patient presents with  . URI   (Consider location/radiation/quality/duration/timing/severity/associated sxs/prior Treatment) Patient is a 38 y.o. male presenting with URI. The history is provided by the patient.  URI Presenting symptoms: congestion, cough, fever, rhinorrhea and sore throat   Presenting symptoms: no ear pain   Severity:  Moderate Onset quality:  Gradual Duration:  4 days Timing:  Constant Progression:  Worsening Chronicity:  New Relieved by:  None tried Worsened by:  Nothing tried Ineffective treatments:  None tried Associated symptoms: myalgias   Associated symptoms: no headaches   Risk factors comment:  Asthma  Adrian [name] is a 38 y.o. male who presents to the UC with flu like symptoms. He has not taken his temperature but felt really hot and then started sweating and having chills. He used his albuterol inhaler a couple days ago but none since then.   Past Medical History  Diagnosis Date  . Asthma   . Eczema   . Migraine   . Foot fracture, left   . Multiple food allergies     fish, tomatoes, peanuts, eggs, chicken and others  . Environmental allergies     grass   Past Surgical History  Procedure Laterality Date  . Inguinal hernia repair  1994   Family History  Problem Relation Age of Onset  . Asthma Father   . Clotting disorder      Aunt  . Breast cancer      Grandmother  . Prostate cancer      uncle  . Diabetes Mother   . Hypertension Mother   . Hyperlipidemia      grandparent  . Hypertension      grandparent/grandparent  . Kidney disease      aunt/uncle/grandparent  . Stroke      grandparent   History  Substance Use Topics  . Smoking status: Current Some Day Smoker    Types: Cigars  . Smokeless tobacco: Not on file  . Alcohol Use: No    Review of Systems  Constitutional: Positive for  fever and chills.  HENT: Positive for congestion, rhinorrhea and sore throat. Negative for ear pain.   Respiratory: Positive for cough.   Gastrointestinal: Negative for nausea, vomiting and abdominal pain.  Genitourinary: Negative for dysuria and frequency.  Musculoskeletal: Positive for myalgias.  Skin: Negative for rash.  Neurological: Negative for dizziness, syncope and headaches.  Psychiatric/Behavioral: Negative for confusion. The patient is not nervous/anxious.     Allergies  Review of patient's allergies indicates no known allergies.  Home Medications   Current Outpatient Rx  Name  Route  Sig  Dispense  Refill  . albuterol (PROVENTIL HFA;VENTOLIN HFA) 108 (90 BASE) MCG/ACT inhaler   Inhalation   Inhale 2 puffs into the lungs every 6 (six) hours as needed.   1 Inhaler   2   . albuterol (PROVENTIL HFA;VENTOLIN HFA) 108 (90 BASE) MCG/ACT inhaler   Inhalation   Inhale 2 puffs into the lungs every 6 (six) hours as needed.   1 Inhaler   1   . azithromycin (ZITHROMAX Z-PAK) 250 MG tablet      Take 2 tablets on the first day and then one tablet daily until  finished   6 each   0   . Spacer/Aero-Holding Chambers (AEROCHAMBER MAX [facility]) MISC   Other   1  each by Other route once.   1 each   3   . Spacer/Aero-Holding Chambers (POCKET SPACER) DEVI   Does not apply   1 each by Does not apply route once.   1 each   0   . triamcinolone ointment (KENALOG) 0.1 %   Topical   Apply topically 2 (two) times daily. As needed   454 g   0   . trimethoprim-polymyxin b (POLYTRIM) ophthalmic solution   Right Eye   Place 1 drop into the right eye every 4 (four) hours.   10 mL   0    BP 119/74  Pulse 61  Temp(Src) 97.8 F (36.6 C) (Oral)  Resp 18  SpO2 98% Physical Exam  Nursing note and vitals reviewed. Constitutional: He is oriented to person, place, and time. He appears well-developed and well-nourished. No distress.  HENT:  Head: Normocephalic.  Right Ear:  Tympanic membrane normal.  Left Ear: Tympanic membrane normal.  Nose: Mucosal edema and rhinorrhea present.  Mouth/Throat: Uvula is midline, oropharynx is clear and moist and mucous membranes are normal.  Eyes: EOM are normal.  Neck: Neck supple.  Cardiovascular: Normal rate.   Pulmonary/Chest: Effort normal. He has no wheezes. He has no rales.  Musculoskeletal: Normal range of motion.  Lymphadenopathy:    He has no cervical adenopathy.  Neurological: He is alert and oriented to person, place, and time. No cranial nerve deficit.  Skin: Skin is warm and dry.  Psychiatric: He has a normal mood and affect. His behavior is normal.    ED Course  Procedures Results for orders placed during the hospital encounter of 08/03/13 (from the past 24 hour(s))  POCT RAPID STREP A ([facility] ONLY)     Status: None   Collection Time    08/03/13  9:40 AM      Result Value Range   Streptococcus, Group A Screen (Direct) NEGATIVE  NEGATIVE    MDM  38 y.o. male with cough and congestion. Will treat for URI. Stable for discharge without any immediate complications. Vital signs normal and 02 SAT 98% on R/A. Discussed with patient clinical and lab findings and plan of care. Patient voices understanding. He will return as needed for worsening symptoms.     Medication List    TAKE these medications       guaifenesin 100 MG/5ML syrup  Commonly known as:  ROBITUSSIN  Take 5-10 mLs (100-200 mg total) by mouth every 4 (four) hours as needed for cough.     pseudoephedrine 60 MG tablet  Commonly known as:  SUDAFED  Take 1 tablet (60 mg total) by mouth every 8 (eight) hours as needed for congestion.      ASK your doctor about these medications       aerochamber max w/mask-large Misc  1 each by Other route once.     POCKET SPACER Devi  1 each by Does not apply route once.     albuterol 108 (90 BASE) MCG/ACT inhaler  Commonly known as:  PROVENTIL HFA;VENTOLIN HFA  Inhale 2 puffs into the lungs every  6 (six) hours as needed.     albuterol 108 (90 BASE) MCG/ACT inhaler  Commonly known as:  PROVENTIL HFA;VENTOLIN HFA  Inhale 2 puffs into the lungs every 6 (six) hours as needed.     azithromycin 250 MG tablet  Commonly known as:  ZITHROMAX Z-PAK  Take 2 tablets on the first day and then one tablet daily until  finished  triamcinolone ointment 0.1 %  Commonly known as:  KENALOG  Apply topically 2 (two) times daily. As needed     trimethoprim-polymyxin b ophthalmic solution  Commonly known as:  POLYTRIM  Place 1 drop into the right eye every 4 (four) hours.           [place], [place] 08/03/13 [phone]

## 2013-08-03 NOTE — Discharge Instructions (Signed)
Cough, Adult  A cough is a reflex. It helps you clear your throat and airways. A cough can help heal your body. A cough can last 2 or 3 weeks (acute) or may last more than 8 weeks (chronic). Some common causes of a cough can include an infection, allergy, or a cold. HOME CARE  Only take medicine as told by your doctor.  If given, take your medicines (antibiotics) as told. Finish them even if you start to feel better.  Use a cold steam vaporizer or humidier in your home. This can help loosen thick spit (secretions).  Sleep so you are almost sitting up (semi-upright). Use pillows to do this. This helps reduce coughing.  Rest as needed.  Stop smoking if you smoke. GET HELP RIGHT AWAY IF:  You have yellowish-white fluid (pus) in your thick spit.  Your cough gets worse.  Your medicine does not reduce coughing, and you are losing sleep.  You cough up blood.  You have trouble breathing.  Your pain gets worse and medicine does not help.  You have a fever. MAKE SURE YOU:   Understand these instructions.  Will watch your condition.  Will get help right away if you are not doing well or get worse. Document Released: 02/27/2011 Document Revised: 09/08/2011 Document Reviewed: 02/27/2011 [facility] Patient Information 2014 [place].  Cool Mist Vaporizers Vaporizers may help relieve the symptoms of a cough and cold. They add moisture to the air, which helps mucus to become thinner and less sticky. This makes it easier to breathe and cough up secretions. Cool mist vaporizers do not cause serious burns like hot mist vaporizers ("steamers, humidifiers"). Vaporizers have not been proved to show they help with colds. You should not use a vaporizer if you are allergic to mold.  HOME CARE INSTRUCTIONS  Follow the package instructions for the vaporizer.  Do not use anything other than distilled water in the vaporizer.  Do not run the vaporizer all of the time. This can cause mold or  bacteria to grow in the vaporizer.  Clean the vaporizer after each time it is used.  Clean and dry the vaporizer well before storing it.  Stop using the vaporizer if worsening respiratory symptoms develop. Document Released: 03/13/2004 Document Revised: 02/16/2013 Document Reviewed: 11/03/2012 [facility] [place] Patient Information 2014 [place], [place].

## 2013-08-03 NOTE — ED Provider Notes (Signed)
Medical screening examination/treatment/procedure(s) were performed by non-physician practitioner and as supervising physician I was immediately available for consultation/collaboration.  Philipp Deputy, M.D.   Harden Mo, MD 08/03/13 [phone]

## 2013-08-05 LAB — CULTURE, GROUP A STREP

## 2013-08-27 ENCOUNTER — Emergency Department (HOSPITAL_COMMUNITY)
Admission: EM | Admit: 2013-08-27 | Discharge: 2013-08-27 | Disposition: A | Discharge status: Home / Self Care | Payer: Self-pay | Attending: Emergency Medicine | Admitting: Emergency Medicine

## 2013-08-27 ENCOUNTER — Encounter (HOSPITAL_COMMUNITY): Payer: Self-pay | Admitting: Emergency Medicine

## 2013-08-27 DIAGNOSIS — J45909 Unspecified asthma, uncomplicated: Secondary | ICD-10-CM | POA: Insufficient documentation

## 2013-08-27 DIAGNOSIS — Z8679 Personal history of other diseases of the circulatory system: Secondary | ICD-10-CM | POA: Insufficient documentation

## 2013-08-27 DIAGNOSIS — R21 Rash and other nonspecific skin eruption: Secondary | ICD-10-CM

## 2013-08-27 DIAGNOSIS — Z8781 Personal history of (healed) traumatic fracture: Secondary | ICD-10-CM | POA: Insufficient documentation

## 2013-08-27 DIAGNOSIS — L2089 Other atopic dermatitis: Secondary | ICD-10-CM | POA: Insufficient documentation

## 2013-08-27 DIAGNOSIS — L209 Atopic dermatitis, unspecified: Secondary | ICD-10-CM

## 2013-08-27 DIAGNOSIS — Z79899 Other long term (current) drug therapy: Secondary | ICD-10-CM | POA: Insufficient documentation

## 2013-08-27 DIAGNOSIS — F17200 Nicotine dependence, unspecified, uncomplicated: Secondary | ICD-10-CM | POA: Insufficient documentation

## 2013-08-27 MED ORDER — FLUTICASONE PROPIONATE 0.05 % EX CREA: TOPICAL_CREAM | Freq: Two times a day (BID) | CUTANEOUS | Status: DC

## 2013-08-27 MED ORDER — CETAPHIL MOISTURIZING EX CREA: TOPICAL_CREAM | CUTANEOUS | Status: DC | PRN

## 2013-08-27 MED ORDER — ALBUTEROL SULFATE HFA 108 (90 BASE) MCG/ACT IN AERS
2.0000 | INHALATION_SPRAY | Freq: Once | RESPIRATORY_TRACT | Status: AC
  Administered 2013-08-27: 2 via RESPIRATORY_TRACT
  Filled 2013-08-27: qty 6.7

## 2013-08-27 NOTE — ED Provider Notes (Signed)
CSN: [id number]     Arrival date & time 08/27/13  0726 History  This chart was scribed for non-physician practitioner, Clayton Bibles, PA-C working with Hoy Morn, MD by Frederich Balding, ED scribe. This patient was seen in room TR11C/TR11C and the patient's care was started at 9:14 AM.   Chief Complaint  Patient presents with  . Rash   The history is provided by the patient. No language interpreter was used.   HPI Comments: Adrian [name] is a 38 y.o. male who presents to the Emergency Department complaining of an itchy rash to his bilateral hands that started several years ago. Pt states he was in prison in 2010 when the rash started and states there was a staph infection going around the prison. Pt is unsure if that is what the rash is. He was previously evaluated for it at Urgent Care, told it was eczema and given triamcinolone cream with some relief. Pt has put Vaseline on the rash with little relief. He states he is also having some joint pain in his hands from the rash. Denies fever, visual disturbance, eye pain, cough, SOB, chest pain. Pt ran out of his inhaler 2 weeks ago and is requesting a refill. He states he uses it about 2 times per week. Denies any current asthma symptoms.   Past Medical History  Diagnosis Date  . Asthma   . Eczema   . Migraine   . Foot fracture, left   . Multiple food allergies     fish, tomatoes, peanuts, eggs, chicken and others  . Environmental allergies     grass   Past Surgical History  Procedure Laterality Date  . Inguinal hernia repair  1994   Family History  Problem Relation Age of Onset  . Asthma Father   . Clotting disorder      Aunt  . Breast cancer      Grandmother  . Prostate cancer      uncle  . Diabetes Mother   . Hypertension Mother   . Hyperlipidemia      grandparent  . Hypertension      grandparent/grandparent  . Kidney disease      aunt/uncle/grandparent  . Stroke      grandparent   History  Substance Use Topics  .  Smoking status: Current Some Day Smoker    Types: Cigars  . Smokeless tobacco: Not on file  . Alcohol Use: No    Review of Systems  Constitutional: Negative for fever.  Eyes: Negative for pain and visual disturbance.  Respiratory: Negative for cough and shortness of breath.   Cardiovascular: Negative for chest pain.  Musculoskeletal: Positive for arthralgias.  Skin: Positive for rash.  All other systems reviewed and are negative.   Allergies  Peanut-containing drug products; Shellfish allergy; and Tomato  Home Medications   Current Outpatient Rx  Name  Route  Sig  Dispense  Refill  . albuterol (PROVENTIL HFA;VENTOLIN HFA) 108 (90 BASE) MCG/ACT inhaler   Inhalation   Inhale 2 puffs into the lungs every 6 (six) hours as needed for wheezing or shortness of breath.         Marland Kitchen ibuprofen (ADVIL,MOTRIN) 200 MG tablet   Oral   Take 400 mg by mouth every 6 (six) hours as needed (pain).         . Spacer/Aero-Holding Chambers (AEROCHAMBER MAX [facility]) MISC   Other   1 each by Other route once.   1 each   3   .  triamcinolone ointment (KENALOG) 0.1 %   Topical   Apply 1 application topically daily as needed (eczema).           BP 152/85  Pulse 60  Temp(Src) 97.4 F (36.3 C) (Oral)  Resp 18  SpO2 97%  Physical Exam  Nursing note and vitals reviewed. Constitutional: He appears well-developed and well-nourished. No distress.  HENT:  Head: Normocephalic and atraumatic.  Neck: Neck supple.  Pulmonary/Chest: Effort normal and breath sounds normal. No respiratory distress. He has no wheezes. He has no rhonchi. He has no rales.  Neurological: He is alert.  Skin: He is not diaphoretic.  Chronically thickened skin over the dorsal aspect of all fingers of bilateral hands with lichenification. Small splitting of the skin over the fifth PIP without erythema, edema, warmth or discharge. No tenderness. Small areas of scaling over the right palm.     ED Course  Procedures  (including critical care time)  DIAGNOSTIC STUDIES: Oxygen Saturation is 97% on RA, normal by my interpretation.    COORDINATION OF CARE: 9:18 AM-Discussed treatment plan which includes dermatology follow up with pt at bedside and pt agreed to plan.   Labs Review Labs Reviewed - No data to display Imaging Review No results found.   EKG Interpretation None      MDM   Final diagnoses:  Rash of hands  Asthma  Atopic dermatitis    Pt with chronic rash on bilateral dorsal hands that is unchanged x years.  Needs PCP,  Possibly dermatology follow up.  Pt has many environmental allergies and eczema.  This rash is only on his hands, possible chronic exposure to allergen.  He was concerned about MRSA - there is no evidence of infection.  D/C home with heavy moisturizing cream and steroid cream. Discussed  findings, treatment, and follow up  with patient.  Pt given return precautions.  Pt verbalizes understanding and agrees with plan.      I personally performed the services described in this documentation, which was scribed in my presence. The recorded information has been reviewed and is accurate.   [place], PA-C 08/27/13 [phone]

## 2013-08-27 NOTE — ED Notes (Addendum)
Reports having dry skin, cracking, itching and pain to palms of hands. Has been told in past that its exzema. Pt recently in jail and thinks its possible staph infection. No distress noted at triage.

## 2013-08-27 NOTE — Discharge Instructions (Signed)
Read the information below.  Use the prescribed medication as directed.  Please discuss all new medications with your pharmacist.  You may return to the Emergency Department at any time for worsening condition or any new symptoms that concern you.  If you develop redness, swelling, pus draining from the wound, or fevers greater than 100.4, return to the ER immediately for a recheck.     Contact Dermatitis Contact dermatitis is a rash that happens when something touches the skin. You touched something that irritates your skin, or you have allergies to something you touched. HOME CARE   Avoid the thing that caused your rash.  Keep your rash away from hot water, soap, sunlight, chemicals, and other things that might bother it.  Do not scratch your rash.  You can take cool baths to help stop itching.  Only take medicine as told by your doctor.  Keep all doctor visits as told. GET HELP RIGHT AWAY IF:   Your rash is not better after 3 days.  Your rash gets worse.  Your rash is puffy (swollen), tender, red, sore, or warm.  You have problems with your medicine. MAKE SURE YOU:   Understand these instructions.  Will watch your condition.  Will get help right away if you are not doing well or get worse. Document Released: 04/13/2009 Document Revised: 09/08/2011 Document Reviewed: 11/19/2010 [facility] Patient Information 2014 [place], [place].  Rash A rash is a change in the color or feel of your skin. There are many different types of rashes. You may have other problems along with your rash. HOME CARE  Avoid the thing that caused your rash.  Do not scratch your rash.  You may take cools baths to help stop itching.  Only take medicines as told by your doctor.  Keep all doctor visits as told. GET HELP RIGHT AWAY IF:   Your pain, puffiness (swelling), or redness gets worse.  You have a fever.  You have new or severe problems.  You have body aches, watery poop (diarrhea), or  you throw up (vomit).  Your rash is not better after 3 days. MAKE SURE YOU:   Understand these instructions.  Will watch your condition.  Will get help right away if you are not doing well or get worse. Document Released: 12/03/2007 Document Revised: 09/08/2011 Document Reviewed: 03/31/2011 [facility] Patient Information 2014 [place], [place].   Emergency Department Resource Guide 1) Find a Doctor and Pay Out of Pocket Although you won't have to find out who is covered by your insurance plan, it is a good idea to ask around and get recommendations. You will then need to call the office and see if the doctor you have chosen will accept you as a new patient and what types of options they offer for patients who are self-pay. Some doctors offer discounts or will set up payment plans for their patients who do not have insurance, but you will need to ask so you aren't surprised when you get to your appointment.  2) Contact Your Local Health Department Not all health departments have doctors that can see patients for sick visits, but many do, so it is worth a call to see if yours does. If you don't know where your local health department is, you can check in your phone book. The CDC also has a tool to help you locate your state's health department, and many state websites also have listings of all of their local health departments.  3) Find a [employer]  If your illness is not likely to be very severe or complicated, you may want to try a walk in clinic. These are popping up all over the country in pharmacies, drugstores, and shopping centers. They're usually staffed by nurse practitioners or physician assistants that have been trained to treat common illnesses and complaints. They're usually fairly quick and inexpensive. However, if you have serious medical issues or chronic medical problems, these are probably not your best option.  No Primary Care Doctor: - Call Health Connect at  [phone] -  they can help you locate a primary care doctor that  accepts your insurance, provides certain services, etc. - Physician Referral Service- [phone]  Chronic Pain Problems: Organization         Address  Phone   Notes  [facility]  [phone] Patients need to be referred by their primary care doctor.   Medication Assistance: Organization         Address  Phone   Notes  [facility] Angellee [facility] [address] [place] [place], [place] [address] [phone] --Must be a resident of [facility] -- Must have NO insurance coverage whatsoever (no Medicaid/ Medicare, etc.) -- The pt. MUST have a primary care doctor that directs their care regularly and follows them in the community   MedAssist  [phone]   [employer]  [phone]    Agencies that provide inexpensive medical care: Organization         Address  Phone   Notes  [facility]  [phone]   [facility]    [phone]   [facility] [address] [place], [place] [address] [phone]   [facility] [address] [place] [place] [phone]   Planned Parenthood    [phone]   [facility]    [phone]   Community Health and [facility]  [address], [place] Phone:  [phone], Fax:  [phone] Hours of Operation:  9 am - 6 pm, M-F.  Also accepts Medicaid/Medicare and self-pay.  [facility]  [address], [place] Phone: [phone], Fax: [phone]. Hours of Operation:  8:30 am - 5:30 pm, M-F.  Also accepts Medicaid and self-pay.  [facility] [place] [address], [place] Phone: [phone]   [facility] [address] Natasha Bence [place], [place] [phone], Ext. [phone] Mondays & Thursdays: 7-9 AM.  First 15 patients are seen on a first come, first serve basis.    Medicaid-accepting  [facility] Providers:  Organization         Address  Phone   Notes  [facility] [address], [place] [phone] Also accepts self-pay patients.  [facility] [address] Laurell Josephs [place], [place]  [phone]   [facility] [address], [place] [phone]   [facility] [address] [place] [phone]   Renaye Rakers [address], [place]   [phone] Only accepts [place] Access [place] patients after they have their name applied to their card.   Self-Pay (no insurance) in [facility]:  Organization         Address  Phone   Notes  Sickle Cell Patients, [facility] [address] [place], [place] [phone])  [facility] [place] [phone]   [facility] [place], [place] [phone]   [facility]/Dr. Osei-Bonsu  [address] [place] or [address], [place] [phone] Phone number for both Nyasiah Moffet [place] and [place] locations is the same.  Urgent Medical and [facility] [address] [place] [phone]   [facility] [address], [place] or [address] [phone]   [facility] [address] [place] [phone], phone; [phone], fax Sees patients 1st and 3rd Saturday of every month.  Must not qualify for public or private insurance (i.e. Medicaid, Medicare, [place] Health Choice, Veterans' Benefits)  Household income should be no more than 200% of the poverty level The clinic cannot treat you if you are pregnant or think you are pregnant  Sexually transmitted diseases are not treated at the clinic.    Dental Care: Organization         Address  Phone  Notes  [facility] Department of [facility] [place] [phone] Accepts children up to age 9 who are enrolled in [place] or [place]; pregnant women with a Medicaid card; and children who have applied for Medicaid or [place] [facility] [place] Health Choice, but were declined, whose parents can pay a reduced fee at time of service.  [facility] Department of [facility]  [address], [place] [phone] Accepts children up to age 65 who are enrolled in [place] or [place]; pregnant women with a Medicaid card; and children who have applied for Medicaid or [place] Health Choice, but were declined, whose parents can pay a reduced fee at time of service.  [facility] Access PRO[facility]  [place] [phone] Patients are seen by appointment only. Walk-ins are not accepted. [place] will see patients 24 years of age and older. Monday - Tuesday (8am-5pm) Most Wednesdays (8:30-5pm) $30 per visit, cash only  [facility] Adult Dental Access PROGRAM  [address], [facility] [phone] Patients are seen by appointment only. Walk-ins are not accepted. [place] will see patients 71 years of age and older. One Wednesday Evening (Monthly: Volunteer Based).  $30 per visit, cash only  [place]  [phone] for adults; Children under age 12, call Graduate Pediatric Dentistry at [phone]. Children aged 51-14, please call [phone] to request a pediatric application.  Dental services are provided in all areas of dental care including fillings, crowns and bridges, complete and partial dentures, implants, gum treatment, root canals, and extractions. Preventive care is also provided. Treatment is provided to both adults and children. Patients are selected via a lottery and there is often a waiting list.   [facility] [address], [place]  [phone] [email or web address]   [facility] [address] [place], [place] [phone], Ext. [phone] Second and Fourth Thursday of each month, opens at 6:30 AM; Clinic ends at 9 AM.  Patients are seen on a first-come first-served basis, and a limited number are seen during each clinic.   [facility]  [address], [place] [phone]   Eligibility Requirements You must have lived in [place], [place], or [place] counties for  at least the last three months.   You cannot be eligible for state or federal sponsored [employer], including [employer], [place], or [employer].   You generally cannot be eligible for healthcare insurance through your employer.    How to apply: Eligibility screenings are held every Tuesday and Wednesday afternoon from 1:00 pm until 4:00 pm. You do not need an appointment for the interview!  [facility] [address], [place], [place]   [place]  [place] Department  [place]  [phone]    Behavioral Health Resources in the Community: Intensive Outpatient Programs Organization         Address  Phone  Notes  [place]. [address] [place], [place] [phone]   [facility] Outpatient [address], [place], [place]   ADS: Alcohol & Drug [facility] [address], [place], [place]   [place] [address]  [place], [place] or [phone]   Substance Abuse Resources Organization         Address  Phone  Notes  Alcohol and Drug Services  [phone]   [place]  [phone]   [facility] [place]   Chinita Pester  [phone]   Residential & Outpatient Substance Abuse Program  [phone]   Psychological Services Organization         Address  Phone  Notes  [facility] [place]   [place]  [phone]   [place] [address], [place] or [phone]    Mobile Crisis Teams Organization         Address  Phone  Notes  [facility], Mobile Crisis Care Unit  [phone]   [facility]otherapeutic [facility]  [address] [place], [place]   [place] Levels [address], [place] [phone]    Self-Help/Support Groups Organization         Address  Phone             Notes  [place]. of [place] - variety of support groups  Magnet Call for more information  Narcotics Anonymous (NA), [facility] [address], [employer] [place]  2 meetings at this location   Special educational needs teacher         Address  Phone  Notes  [facility]idential [facility] [place],    [place]  [phone]   [facility]  [address], [place] [phone], [place], [place]   [place], [place] [phone] Admissions: 8am-3pm M-F  Incentives Substance [place] [address],    [place], [place] [phone]   [facility] [address] [place], [place], [place]   [facility] [address],  [place], [place]   [facility] Programs - Intensive Outpatient [address], [place], [place]   [facility] ([place]  [place], [place] [phone] or [phone]   [facility] (RTS) [address], [place], [place] Accepts Medicaid  [facility] [place] [address]  [place] [phone] Substance Abuse/Addiction Treatment   [facility] Organization         Address  Phone  Notes  [facility]  [phone]  Domenic Schwab, PhD [address] Arlis Porta [place], [place]   [phone] or  [phone]   [place], [place] [phone]   [address] [place] [address], [place], [place] [phone] Insurance/Medicaid/sponsorship through [facility] and Families [address]                                    [place], [place] [phone] [place] [address], [place] [phone]    Dr. Adele Schilder  [phone]   [facility] Dept. 1) [address] [place] 2) [place] 3)  [facility] [address], [place] [phone]  [phone]   [place] [phone] or [phone] (After Hours)

## 2013-08-28 NOTE — ED Provider Notes (Signed)
Medical screening examination/treatment/procedure(s) were performed by non-physician practitioner and as supervising physician I was immediately available for consultation/collaboration.   EKG Interpretation None        Hoy Morn, MD 08/28/13 [phone]

## 2013-09-13 ENCOUNTER — Emergency Department (HOSPITAL_COMMUNITY): Payer: Self-pay

## 2013-09-13 ENCOUNTER — Emergency Department (HOSPITAL_COMMUNITY)
Admission: EM | Admit: 2013-09-13 | Discharge: 2013-09-14 | Disposition: A | Discharge status: Home / Self Care | Payer: Self-pay | Attending: Emergency Medicine | Admitting: Emergency Medicine

## 2013-09-13 ENCOUNTER — Encounter (HOSPITAL_COMMUNITY): Payer: Self-pay | Admitting: Emergency Medicine

## 2013-09-13 DIAGNOSIS — J029 Acute pharyngitis, unspecified: Secondary | ICD-10-CM | POA: Insufficient documentation

## 2013-09-13 DIAGNOSIS — J111 Influenza due to unidentified influenza virus with other respiratory manifestations: Secondary | ICD-10-CM | POA: Insufficient documentation

## 2013-09-13 DIAGNOSIS — IMO0002 Reserved for concepts with insufficient information to code with codable children: Secondary | ICD-10-CM | POA: Insufficient documentation

## 2013-09-13 DIAGNOSIS — R6889 Other general symptoms and signs: Secondary | ICD-10-CM

## 2013-09-13 DIAGNOSIS — R197 Diarrhea, unspecified: Secondary | ICD-10-CM | POA: Insufficient documentation

## 2013-09-13 DIAGNOSIS — J45909 Unspecified asthma, uncomplicated: Secondary | ICD-10-CM | POA: Insufficient documentation

## 2013-09-13 DIAGNOSIS — Z8679 Personal history of other diseases of the circulatory system: Secondary | ICD-10-CM | POA: Insufficient documentation

## 2013-09-13 DIAGNOSIS — Z8781 Personal history of (healed) traumatic fracture: Secondary | ICD-10-CM | POA: Insufficient documentation

## 2013-09-13 DIAGNOSIS — Z79899 Other long term (current) drug therapy: Secondary | ICD-10-CM | POA: Insufficient documentation

## 2013-09-13 DIAGNOSIS — Z872 Personal history of diseases of the skin and subcutaneous tissue: Secondary | ICD-10-CM | POA: Insufficient documentation

## 2013-09-13 DIAGNOSIS — Z87891 Personal history of nicotine dependence: Secondary | ICD-10-CM | POA: Insufficient documentation

## 2013-09-13 DIAGNOSIS — R112 Nausea with vomiting, unspecified: Secondary | ICD-10-CM | POA: Insufficient documentation

## 2013-09-13 MED ORDER — PREDNISONE 20 MG PO TABS
60.0000 mg | ORAL_TABLET | Freq: Once | ORAL | Status: AC
  Administered 2013-09-13: 60 mg via ORAL
  Filled 2013-09-13: qty 3

## 2013-09-13 NOTE — ED Provider Notes (Signed)
CSN: [id number]     Arrival date & time 09/13/13  2236 History  This chart was scribed for non-physician practitioner, [name], PA-C working with [name], MD by Frederich Balding, ED scribe. This patient was seen in room TR09C/TR09C and the patient's care was started at 11:33 PM.   Chief Complaint  Patient presents with  . Influenza   The history is provided by the patient. No language interpreter was used.   HPI Comments: Adrian [name] is a 38 y.o. male with history of asthma who presents to the Emergency Department complaining of generalized body aches, chills, congestion, cough, nausea, emesis, diarrhea, sore throat and subjective fever that started 1-2 days ago. Pt has taken NyQuil with no relief. He has used his inhaler once with little relief. Denies chest pain, abdominal pain.   Past Medical History  Diagnosis Date  . Asthma   . Eczema   . Migraine   . Foot fracture, left   . Multiple food allergies     fish, tomatoes, peanuts, eggs, chicken and others  . Environmental allergies     grass   Past Surgical History  Procedure Laterality Date  . Inguinal hernia repair  1994   Family History  Problem Relation Age of Onset  . Asthma Father   . Clotting disorder      Aunt  . Breast cancer      Grandmother  . Prostate cancer      uncle  . Diabetes Mother   . Hypertension Mother   . Hyperlipidemia      grandparent  . Hypertension      grandparent/grandparent  . Kidney disease      aunt/uncle/grandparent  . Stroke      grandparent   History  Substance Use Topics  . Smoking status: Former Smoker    Types: Cigars    Quit date: 09/13/2012  . Smokeless tobacco: Not on file  . Alcohol Use: No    Review of Systems  Constitutional: Positive for fever and chills.  HENT: Positive for congestion and sore throat.   Respiratory: Positive for cough.   Cardiovascular: Negative for chest pain.  Gastrointestinal: Positive for nausea, vomiting and diarrhea.  Negative for abdominal pain.  Musculoskeletal: Positive for myalgias.  All other systems reviewed and are negative.   Allergies  Peanut-containing drug products; Shellfish allergy; and Tomato  Home Medications   Current Outpatient Rx  Name  Route  Sig  Dispense  Refill  . albuterol (PROVENTIL HFA;VENTOLIN HFA) 108 (90 BASE) MCG/ACT inhaler   Inhalation   Inhale 2 puffs into the lungs every 6 (six) hours as needed for wheezing or shortness of breath.         . Emollient (CETAPHIL) cream   Topical   Apply topically as needed.   90 g   0   . fluticasone (CUTIVATE) 0.05 % cream   Topical   Apply topically 2 (two) times daily.   30 g   0   . ibuprofen (ADVIL,MOTRIN) 200 MG tablet   Oral   Take 400 mg by mouth every 6 (six) hours as needed (pain).         . Spacer/Aero-Holding Chambers (AEROCHAMBER MAX [employer]) MISC   Other   1 each by Other route once.   1 each   3   . triamcinolone ointment (KENALOG) 0.1 %   Topical   Apply 1 application topically daily as needed (eczema).  BP 128/73  Pulse 64  Temp(Src) 98.2 F (36.8 C) (Oral)  Resp 20  Wt 226 lb 3 oz (102.598 kg)  SpO2 98%  Physical Exam  Nursing note and vitals reviewed. Constitutional: He is oriented to person, place, and time. He appears well-developed and well-nourished. No distress.  HENT:  Head: Normocephalic and atraumatic.  Right Ear: Tympanic membrane and ear canal normal.  Left Ear: Tympanic membrane and ear canal normal.  Mouth/Throat: Uvula is midline. Posterior oropharyngeal erythema present. No oropharyngeal exudate.  Nasal congestion present.   Eyes: EOM are normal.  Neck: Neck supple. No tracheal deviation present.  Cardiovascular: Normal rate.   Pulmonary/Chest: Effort normal. No respiratory distress. He has no wheezes. He has no rhonchi. He has no rales.  Decreased air movement bilaterally.  Abdominal: Soft. There is no tenderness.  Musculoskeletal: Normal range of  motion.  Neurological: He is alert and oriented to person, place, and time.  Skin: Skin is warm and dry.  Psychiatric: He has a normal mood and affect. His behavior is normal.    ED Course  Procedures (including critical care time)  DIAGNOSTIC STUDIES: Oxygen Saturation is 98% on RA, normal by my interpretation.    COORDINATION OF CARE: 11:36 PM-Discussed treatment plan which includes chest xray with pt at bedside and pt agreed to plan.   Labs Review Labs Reviewed - No data to display Imaging Review Dg Chest 2 View  09/14/2013   CLINICAL DATA:  Chest pain  EXAM: CHEST  2 VIEW  COMPARISON:  None available  FINDINGS: The cardiac and mediastinal silhouettes are within normal limits.  The lungs are normally inflated. No airspace consolidation, pleural effusion, or pulmonary edema is identified. There is no pneumothorax.  No acute osseous abnormality identified.  IMPRESSION: No active cardiopulmonary disease.   Electronically Signed   By: Jeannine Boga M.D.   On: 09/14/2013 00:34     EKG Interpretation None      MDM   Final diagnoses:  Flu-like symptoms    And flulike symptoms for 2 days. He reports intermittent nausea and vomiting and diarrhea. He denies any other symptoms here. He states he is able to keep fluids and solids down. He does report nasal congestion, sore throat, cough. His vital signs here are normal, his heart rate 64, temperature 98.2. Blood pressure and oxygen saturation within normal. There is no evidence of any meningismus or possibility of peritonsillar or retropharyngeal abscesses based on exam. Chest x-ray obtained to rule out pneumonia and is negative. Patient does have history of asthma, will start him on prednisone taper, inhaler. Sudafed for congestion. We'll provide with Hycodan for cough and pain. He is out of the window for Tamiflu. At this time stable for discharge home and symptomatic treatment at home. I have instructed him to return if his symptoms  are worsening.  Filed Vitals:   09/13/13 2238 09/14/13 0121  BP: 128/73 127/77  Pulse: 64 62  Temp: 98.2 F (36.8 C) 97.7 F (36.5 C)  TempSrc: Oral Oral  Resp: 20 21  Weight: 226 lb 3 oz (102.598 kg)   SpO2: 98% 100%     I personally performed the services described in this documentation, which was scribed in my presence. The recorded information has been reviewed and is accurate.   Renold Genta, PA-C 09/14/13 [phone]

## 2013-09-13 NOTE — ED Notes (Addendum)
Pt alert, NAD, calm, interactive, steady gait, wearing mask, questioning why he needs to put a gown on. VS reviewed.

## 2013-09-13 NOTE — ED Notes (Signed)
Patient arrives from home with complaint of Generalized body aches, Nausea, Vomiting, Diarrhea, Sore Throat, Subjective Fevers, and feeling fatigued.

## 2013-09-14 MED ORDER — PREDNISONE 10 MG PO TABS: ORAL_TABLET | ORAL | Status: DC

## 2013-09-14 MED ORDER — PSEUDOEPHEDRINE HCL 30 MG PO TABS: 30.0000 mg | ORAL_TABLET | ORAL | Status: DC | PRN

## 2013-09-14 MED ORDER — HYDROCODONE-HOMATROPINE 5-1.5 MG/5ML PO SYRP: 5.0000 mL | ORAL_SOLUTION | Freq: Four times a day (QID) | ORAL | Status: DC | PRN

## 2013-09-14 NOTE — Discharge Instructions (Signed)
Your chest x-ray is normal. Take your inhaler, 2 puffs every 4 hours. Prednisone as prescribed until all gone. Take Sudafed as prescribed for congestion. Take Hycodan cough syrup for cough. Make sure to rest, drink plenty of fluids, followup with primary care doctor.   Upper Respiratory Infection, Adult An upper respiratory infection (URI) is also sometimes known as the common cold. The upper respiratory tract includes the nose, sinuses, throat, trachea, and bronchi. Bronchi are the airways leading to the lungs. Most people improve within 1 week, but symptoms can last up to 2 weeks. A residual cough may last even longer.  CAUSES Many different viruses can infect the tissues lining the upper respiratory tract. The tissues become irritated and inflamed and often become very moist. Mucus production is also common. A cold is contagious. You can easily spread the virus to others by oral contact. This includes kissing, sharing a glass, coughing, or sneezing. Touching your mouth or nose and then touching a surface, which is then touched by another person, can also spread the virus. SYMPTOMS  Symptoms typically develop 1 to 3 days after you come in contact with a cold virus. Symptoms vary from person to person. They may include:  Runny nose.  Sneezing.  Nasal congestion.  Sinus irritation.  Sore throat.  Loss of voice (laryngitis).  Cough.  Fatigue.  Muscle aches.  Loss of appetite.  Headache.  Low-grade fever. DIAGNOSIS  You might diagnose your own cold based on familiar symptoms, since most people get a cold 2 to 3 times a year. Your caregiver can confirm this based on your exam. Most importantly, your caregiver can check that your symptoms are not due to another disease such as strep throat, sinusitis, pneumonia, asthma, or epiglottitis. Blood tests, throat tests, and X-rays are not necessary to diagnose a common cold, but they may sometimes be helpful in excluding other more serious  diseases. Your caregiver will decide if any further tests are required. RISKS AND COMPLICATIONS  You may be at risk for a more severe case of the common cold if you smoke cigarettes, have chronic heart disease (such as heart failure) or lung disease (such as asthma), or if you have a weakened immune system. The very young and very old are also at risk for more serious infections. Bacterial sinusitis, middle ear infections, and bacterial pneumonia can complicate the common cold. The common cold can worsen asthma and chronic obstructive pulmonary disease (COPD). Sometimes, these complications can require emergency medical care and may be life-threatening. PREVENTION  The best way to protect against getting a cold is to practice good hygiene. Avoid oral or hand contact with people with cold symptoms. Wash your hands often if contact occurs. There is no clear evidence that vitamin C, vitamin E, echinacea, or exercise reduces the chance of developing a cold. However, it is always recommended to get plenty of rest and practice good nutrition. TREATMENT  Treatment is directed at relieving symptoms. There is no cure. Antibiotics are not effective, because the infection is caused by a virus, not by bacteria. Treatment may include:  Increased fluid intake. Sports drinks offer valuable electrolytes, sugars, and fluids.  Breathing heated mist or steam (vaporizer or shower).  Eating chicken soup or other clear broths, and maintaining good nutrition.  Getting plenty of rest.  Using gargles or lozenges for comfort.  Controlling fevers with ibuprofen or acetaminophen as directed by your caregiver.  Increasing usage of your inhaler if you have asthma. Zinc gel and zinc lozenges,  taken in the first 24 hours of the common cold, can shorten the duration and lessen the severity of symptoms. Pain medicines may help with fever, muscle aches, and throat pain. A variety of non-prescription medicines are available to  treat congestion and runny nose. Your caregiver can make recommendations and may suggest nasal or lung inhalers for other symptoms.  HOME CARE INSTRUCTIONS   Only take over-the-counter or prescription medicines for pain, discomfort, or fever as directed by your caregiver.  Use a warm mist humidifier or inhale steam from a shower to increase air moisture. This may keep secretions moist and make it easier to breathe.  Drink enough water and fluids to keep your urine clear or pale yellow.  Rest as needed.  Return to work when your temperature has returned to normal or as your caregiver advises. You may need to stay home longer to avoid infecting others. You can also use a face mask and careful hand washing to prevent spread of the virus. SEEK MEDICAL CARE IF:   After the first few days, you feel you are getting worse rather than better.  You need your caregiver's advice about medicines to control symptoms.  You develop chills, worsening shortness of breath, or brown or red sputum. These may be signs of pneumonia.  You develop yellow or brown nasal discharge or pain in the face, especially when you bend forward. These may be signs of sinusitis.  You develop a fever, swollen neck glands, pain with swallowing, or white areas in the back of your throat. These may be signs of strep throat. SEEK IMMEDIATE MEDICAL CARE IF:   You have a fever.  You develop severe or persistent headache, ear pain, sinus pain, or chest pain.  You develop wheezing, a prolonged cough, cough up blood, or have a change in your usual mucus (if you have chronic lung disease).  You develop sore muscles or a stiff neck. Document Released: 12/10/2000 Document Revised: 09/08/2011 Document Reviewed: 10/18/2010 [facility] Patient Information 2014 [place], [place].

## 2013-09-15 ENCOUNTER — Encounter (HOSPITAL_COMMUNITY): Payer: Self-pay | Admitting: Emergency Medicine

## 2013-09-15 ENCOUNTER — Emergency Department (HOSPITAL_COMMUNITY)
Admission: EM | Admit: 2013-09-15 | Discharge: 2013-09-15 | Disposition: A | Discharge status: Home / Self Care | Payer: Self-pay | Attending: Emergency Medicine | Admitting: Emergency Medicine

## 2013-09-15 DIAGNOSIS — J45909 Unspecified asthma, uncomplicated: Secondary | ICD-10-CM | POA: Insufficient documentation

## 2013-09-15 DIAGNOSIS — R6889 Other general symptoms and signs: Secondary | ICD-10-CM | POA: Insufficient documentation

## 2013-09-15 DIAGNOSIS — Z9109 Other allergy status, other than to drugs and biological substances: Secondary | ICD-10-CM | POA: Insufficient documentation

## 2013-09-15 DIAGNOSIS — Z91010 Allergy to peanuts: Secondary | ICD-10-CM | POA: Insufficient documentation

## 2013-09-15 DIAGNOSIS — J069 Acute upper respiratory infection, unspecified: Secondary | ICD-10-CM

## 2013-09-15 DIAGNOSIS — Z8669 Personal history of other diseases of the nervous system and sense organs: Secondary | ICD-10-CM | POA: Insufficient documentation

## 2013-09-15 DIAGNOSIS — Z91013 Allergy to seafood: Secondary | ICD-10-CM | POA: Insufficient documentation

## 2013-09-15 DIAGNOSIS — Z87891 Personal history of nicotine dependence: Secondary | ICD-10-CM | POA: Insufficient documentation

## 2013-09-15 DIAGNOSIS — L259 Unspecified contact dermatitis, unspecified cause: Secondary | ICD-10-CM | POA: Insufficient documentation

## 2013-09-15 DIAGNOSIS — Z87311 Personal history of (healed) other pathological fracture: Secondary | ICD-10-CM | POA: Insufficient documentation

## 2013-09-15 DIAGNOSIS — Z79899 Other long term (current) drug therapy: Secondary | ICD-10-CM | POA: Insufficient documentation

## 2013-09-15 DIAGNOSIS — B9789 Other viral agents as the cause of diseases classified elsewhere: Secondary | ICD-10-CM

## 2013-09-15 DIAGNOSIS — Z91018 Allergy to other foods: Secondary | ICD-10-CM | POA: Insufficient documentation

## 2013-09-15 NOTE — Discharge Instructions (Signed)
1. Medications: usual home medications plus the medications prescribed at your last visit 2. Treatment: rest, drink plenty of fluids,  3. Follow Up: Please followup with your primary doctor for discussion of your diagnoses and further evaluation after today's visit; if you do not have a primary care doctor use the resource guide provided to find one;     Upper Respiratory Infection, Adult An upper respiratory infection (URI) is also sometimes known as the common cold. The upper respiratory tract includes the nose, sinuses, throat, trachea, and bronchi. Bronchi are the airways leading to the lungs. Most people improve within 1 week, but symptoms can last up to 2 weeks. A residual cough may last even longer.  CAUSES Many different viruses can infect the tissues lining the upper respiratory tract. The tissues become irritated and inflamed and often become very moist. Mucus production is also common. A cold is contagious. You can easily spread the virus to others by oral contact. This includes kissing, sharing a glass, coughing, or sneezing. Touching your mouth or nose and then touching a surface, which is then touched by another person, can also spread the virus. SYMPTOMS  Symptoms typically develop 1 to 3 days after you come in contact with a cold virus. Symptoms vary from person to person. They may include:  Runny nose.  Sneezing.  Nasal congestion.  Sinus irritation.  Sore throat.  Loss of voice (laryngitis).  Cough.  Fatigue.  Muscle aches.  Loss of appetite.  Headache.  Low-grade fever. DIAGNOSIS  You might diagnose your own cold based on familiar symptoms, since most people get a cold 2 to 3 times a year. Your caregiver can confirm this based on your exam. Most importantly, your caregiver can check that your symptoms are not due to another disease such as strep throat, sinusitis, pneumonia, asthma, or epiglottitis. Blood tests, throat tests, and X-rays are not necessary to  diagnose a common cold, but they may sometimes be helpful in excluding other more serious diseases. Your caregiver will decide if any further tests are required. RISKS AND COMPLICATIONS  You may be at risk for a more severe case of the common cold if you smoke cigarettes, have chronic heart disease (such as heart failure) or lung disease (such as asthma), or if you have a weakened immune system. The very young and very old are also at risk for more serious infections. Bacterial sinusitis, middle ear infections, and bacterial pneumonia can complicate the common cold. The common cold can worsen asthma and chronic obstructive pulmonary disease (COPD). Sometimes, these complications can require emergency medical care and may be life-threatening. PREVENTION  The best way to protect against getting a cold is to practice good hygiene. Avoid oral or hand contact with people with cold symptoms. Wash your hands often if contact occurs. There is no clear evidence that vitamin C, vitamin E, echinacea, or exercise reduces the chance of developing a cold. However, it is always recommended to get plenty of rest and practice good nutrition. TREATMENT  Treatment is directed at relieving symptoms. There is no cure. Antibiotics are not effective, because the infection is caused by a virus, not by bacteria. Treatment may include:  Increased fluid intake. Sports drinks offer valuable electrolytes, sugars, and fluids.  Breathing heated mist or steam (vaporizer or shower).  Eating chicken soup or other clear broths, and maintaining good nutrition.  Getting plenty of rest.  Using gargles or lozenges for comfort.  Controlling fevers with ibuprofen or acetaminophen as directed by your  caregiver.  Increasing usage of your inhaler if you have asthma. Zinc gel and zinc lozenges, taken in the first 24 hours of the common cold, can shorten the duration and lessen the severity of symptoms. Pain medicines may help with fever,  muscle aches, and throat pain. A variety of non-prescription medicines are available to treat congestion and runny nose. Your caregiver can make recommendations and may suggest nasal or lung inhalers for other symptoms.  HOME CARE INSTRUCTIONS   Only take over-the-counter or prescription medicines for pain, discomfort, or fever as directed by your caregiver.  Use a warm mist humidifier or inhale steam from a shower to increase air moisture. This may keep secretions moist and make it easier to breathe.  Drink enough water and fluids to keep your urine clear or pale yellow.  Rest as needed.  Return to work when your temperature has returned to normal or as your caregiver advises. You may need to stay home longer to avoid infecting others. You can also use a face mask and careful hand washing to prevent spread of the virus. SEEK MEDICAL CARE IF:   After the first few days, you feel you are getting worse rather than better.  You need your caregiver's advice about medicines to control symptoms.  You develop chills, worsening shortness of breath, or brown or red sputum. These may be signs of pneumonia.  You develop yellow or brown nasal discharge or pain in the face, especially when you bend forward. These may be signs of sinusitis.  You develop a fever, swollen neck glands, pain with swallowing, or white areas in the back of your throat. These may be signs of strep throat. SEEK IMMEDIATE MEDICAL CARE IF:   You have a fever.  You develop severe or persistent headache, ear pain, sinus pain, or chest pain.  You develop wheezing, a prolonged cough, cough up blood, or have a change in your usual mucus (if you have chronic lung disease).  You develop sore muscles or a stiff neck. Document Released: 12/10/2000 Document Revised: 09/08/2011 Document Reviewed: 10/18/2010 [facility] Patient Information 2014 [name], [place].

## 2013-09-15 NOTE — ED Notes (Signed)
Pt st's he was seen here on 3/17 for flu like symptoms.  St's was given Rx's but did not get them filled til today.  Pt st's not any better.  Continues to have body aches, sneezing and cough. Denies nausea vomiting and diarrhea.

## 2013-09-15 NOTE — ED Provider Notes (Signed)
CSN: [id number]     Arrival date & time 09/15/13  1802 History  This chart was scribed for non-physician practitioner Abigail Butts, PA-C working with Saddie Benders. Dorna Mai, MD by Adriana Reams, ED Scribe. This patient was seen in room TR07C/TR07C and the patient's care was started at 2200.   First MD Initiated Contact with Patient 09/15/13 2200     Chief Complaint  Patient presents with  . Generalized Body Aches    The history is provided by the patient and medical records. No language interpreter was used.   HPI Comments: Adrian [name] is a 38 y.o. male who presents to the Emergency Department complaining of a few days of gradual onset, gradually worsening generalized body aches. He was seen here on 09/13/13 for flu-like symptoms including HA, congestion, chills, rhinorrhea, and fever. He did not get his prescriptions filled until today. He is requesting an extension of his work note until Monday. He denies experiencing nausea or vomiting today.  He reports nothing makes symptoms better or worse.  He denies fever, chills.     Past Medical History  Diagnosis Date  . Asthma   . Eczema   . Migraine   . Foot fracture, left   . Multiple food allergies     fish, tomatoes, peanuts, eggs, chicken and others  . Environmental allergies     grass   Past Surgical History  Procedure Laterality Date  . Inguinal hernia repair  1994   Family History  Problem Relation Age of Onset  . Asthma Father   . Clotting disorder      Aunt  . Breast cancer      Grandmother  . Prostate cancer      uncle  . Diabetes Mother   . Hypertension Mother   . Hyperlipidemia      grandparent  . Hypertension      grandparent/grandparent  . Kidney disease      aunt/uncle/grandparent  . Stroke      grandparent   History  Substance Use Topics  . Smoking status: Former Smoker    Types: Cigars    Quit date: 09/13/2012  . Smokeless tobacco: Not on file  . Alcohol Use: No    Review of Systems   Constitutional: Positive for fatigue. Negative for fever, chills and appetite change.  HENT: Positive for congestion, postnasal drip, rhinorrhea, sinus pressure and sore throat. Negative for ear discharge, ear pain and mouth sores.   Eyes: Negative for visual disturbance.  Respiratory: Positive for cough. Negative for chest tightness, shortness of breath, wheezing and stridor.   Cardiovascular: Negative for chest pain, palpitations and leg swelling.  Gastrointestinal: Negative for nausea, vomiting, abdominal pain and diarrhea.  Genitourinary: Negative for dysuria, urgency, frequency and hematuria.  Musculoskeletal: Negative for arthralgias, back pain, myalgias and neck stiffness.  Skin: Negative for rash.  Neurological: Negative for syncope, light-headedness, numbness and headaches.  Hematological: Negative for adenopathy.  Psychiatric/Behavioral: The patient is not nervous/anxious.   All other systems reviewed and are negative.    Allergies  Peanut-containing drug products; Shellfish allergy; and Tomato  Home Medications   Current Outpatient Rx  Name  Route  Sig  Dispense  Refill  . albuterol (PROVENTIL HFA;VENTOLIN HFA) 108 (90 BASE) MCG/ACT inhaler   Inhalation   Inhale 2 puffs into the lungs every 6 (six) hours as needed for wheezing or shortness of breath.         . Emollient (CETAPHIL) cream   Topical   Apply topically  as needed.   90 g   0   . fluticasone (CUTIVATE) 0.05 % cream   Topical   Apply topically 2 (two) times daily.   30 g   0   . HYDROcodone-homatropine (HYCODAN) 5-1.5 MG/5ML syrup   Oral   Take 5 mLs by mouth every 6 (six) hours as needed for cough.   120 mL   0   . ibuprofen (ADVIL,MOTRIN) 200 MG tablet   Oral   Take 400 mg by mouth every 6 (six) hours as needed (pain).         . predniSONE (DELTASONE) 10 MG tablet      Take 5 tab day 1, take 4 tab day 2, take 3 tab day 3, take 2 tab day 4, and take 1 tab day 5   15 tablet   0   .  pseudoephedrine (SUDAFED) 30 MG tablet   Oral   Take 1 tablet (30 mg total) by mouth every 4 (four) hours as needed for congestion.   30 tablet   0   . triamcinolone ointment (KENALOG) 0.1 %   Topical   Apply 1 application topically daily as needed (eczema).          Marland Kitchen Spacer/Aero-Holding Chambers (AEROCHAMBER MAX [facility]) MISC   Other   1 each by Other route once.   1 each   3    BP 146/89  Pulse 59  Temp(Src) 98.4 F (36.9 C) (Oral)  Resp 17  Ht 5\' 11"  (1.803 m)  Wt 224 lb (101.606 kg)  BMI 31.26 kg/m2  SpO2 99%  Physical Exam  Nursing note and vitals reviewed. Constitutional: He is oriented to person, place, and time. He appears well-developed and well-nourished. No distress.  HENT:  Head: Normocephalic and atraumatic.  Right Ear: Tympanic membrane, external ear and ear canal normal.  Left Ear: Tympanic membrane, external ear and ear canal normal.  Nose: Mucosal edema and rhinorrhea present. No epistaxis. Right sinus exhibits no maxillary sinus tenderness and no frontal sinus tenderness. Left sinus exhibits no maxillary sinus tenderness and no frontal sinus tenderness.  Mouth/Throat: Uvula is midline, oropharynx is clear and moist and mucous membranes are normal. Mucous membranes are not pale and not cyanotic. No oropharyngeal exudate, posterior oropharyngeal edema, posterior oropharyngeal erythema or tonsillar abscesses.  Eyes: Conjunctivae are normal. Pupils are equal, round, and reactive to light.  Neck: Normal range of motion and full passive range of motion without pain.  Cardiovascular: Normal rate, regular rhythm, normal heart sounds and intact distal pulses.   RRR  Pulmonary/Chest: Effort normal and breath sounds normal. No accessory muscle usage or stridor. Not tachypneic. No respiratory distress. He has no decreased breath sounds. He has no wheezes. He has no rhonchi. He has no rales.  Clear and equal breath sounds  Abdominal: Soft. Bowel sounds are  normal. There is no tenderness.  Abdomen soft and nontender  Musculoskeletal: Normal range of motion.  Lymphadenopathy:    He has no cervical adenopathy.  Neurological: He is alert and oriented to person, place, and time.  Skin: Skin is warm and dry. No rash noted. He is not diaphoretic.  Psychiatric: He has a normal mood and affect.    ED Course  Procedures (including critical care time) DIAGNOSTIC STUDIES: Oxygen Saturation is 99% on RA, normal by my interpretation.    COORDINATION OF CARE: 10:13 PM Discussed treatment plan with pt at bedside and pt agreed to plan. Advised pt to take his prescriptions as  directed. Will give a note for work.    Labs Review Labs Reviewed - No data to display Imaging Review Dg Chest 2 View  09/14/2013   CLINICAL DATA:  Chest pain  EXAM: CHEST  2 VIEW  COMPARISON:  None available  FINDINGS: The cardiac and mediastinal silhouettes are within normal limits.  The lungs are normally inflated. No airspace consolidation, pleural effusion, or pulmonary edema is identified. There is no pneumothorax.  No acute osseous abnormality identified.  IMPRESSION: No active cardiopulmonary disease.   Electronically Signed   By: Jeannine Boga M.D.   On: 09/14/2013 00:34     EKG Interpretation None      MDM   Final diagnoses:  Viral URI with cough   [name] presents with history and physical consistent with viral syndrome and likely viral URI.  Patient reports persistence of symptoms but he did not fill his prescriptions until today.  Review shows the patient had a chest x-ray on 09/14/2013 which was negative for pneumonia, pneumothorax or pulmonary edema.  I personally reviewed the imaging tests through PACS system  I reviewed available ER/hospitalization records through the EMR  Patients symptoms are consistent with URI, likely viral etiology. Patient currently has symptomatic treatment for his symptoms. Will extend his work note for 2 days.   Verbalizes understanding and is agreeable with plan. Pt is hemodynamically stable & in NAD prior to dc.  It has been determined that no acute conditions requiring further emergency intervention are present at this time. The patient/guardian have been advised of the diagnosis and plan. We have discussed signs and symptoms that warrant return to the ED, such as changes or worsening in symptoms.   Vital signs are stable at discharge.   BP 146/89  Pulse 59  Temp(Src) 98.4 F (36.9 C) (Oral)  Resp 17  Ht 5\' 11"  (1.803 m)  Wt 224 lb (101.606 kg)  BMI 31.26 kg/m2  SpO2 99%  Patient/guardian has voiced understanding and agreed to follow-up with the PCP or specialist.    I personally performed the services described in this documentation, which was scribed in my presence. The recorded information has been reviewed and is accurate.      Jarrett Soho [place] Hudman, PA-C 09/15/13 2225

## 2013-09-15 NOTE — ED Notes (Signed)
Pt reports he plans to take medications that were prescribed to him on 3/17- states he is here for an extended work note due to prolonged symptoms.

## 2013-09-16 NOTE — ED Provider Notes (Signed)
Medical screening examination/treatment/procedure(s) were performed by non-physician practitioner and as supervising physician I was immediately available for consultation/collaboration.  [name], MD 09/16/13 [phone]

## 2013-09-24 NOTE — ED Provider Notes (Signed)
Medical screening examination/treatment/procedure(s) were performed by non-physician practitioner and as supervising physician I was immediately available for consultation/collaboration.   EKG Interpretation None        Saddie Benders. Mickey Hebel, MD 09/24/13 0157

## 2013-09-29 ENCOUNTER — Other Ambulatory Visit: Payer: Self-pay | Admitting: Family Medicine

## 2013-09-29 ENCOUNTER — Emergency Department (HOSPITAL_COMMUNITY): Payer: Self-pay

## 2013-09-29 ENCOUNTER — Emergency Department (HOSPITAL_COMMUNITY)
Admission: EM | Admit: 2013-09-29 | Discharge: 2013-09-29 | Disposition: A | Discharge status: Home / Self Care | Payer: Self-pay | Attending: Emergency Medicine | Admitting: Emergency Medicine

## 2013-09-29 ENCOUNTER — Encounter (HOSPITAL_COMMUNITY): Payer: Self-pay | Admitting: Emergency Medicine

## 2013-09-29 DIAGNOSIS — Z79899 Other long term (current) drug therapy: Secondary | ICD-10-CM | POA: Insufficient documentation

## 2013-09-29 DIAGNOSIS — Z872 Personal history of diseases of the skin and subcutaneous tissue: Secondary | ICD-10-CM | POA: Insufficient documentation

## 2013-09-29 DIAGNOSIS — Z8679 Personal history of other diseases of the circulatory system: Secondary | ICD-10-CM | POA: Insufficient documentation

## 2013-09-29 DIAGNOSIS — Z87828 Personal history of other (healed) physical injury and trauma: Secondary | ICD-10-CM | POA: Insufficient documentation

## 2013-09-29 DIAGNOSIS — G8929 Other chronic pain: Secondary | ICD-10-CM | POA: Insufficient documentation

## 2013-09-29 DIAGNOSIS — J45909 Unspecified asthma, uncomplicated: Secondary | ICD-10-CM | POA: Insufficient documentation

## 2013-09-29 DIAGNOSIS — Z8781 Personal history of (healed) traumatic fracture: Secondary | ICD-10-CM | POA: Insufficient documentation

## 2013-09-29 DIAGNOSIS — IMO0002 Reserved for concepts with insufficient information to code with codable children: Secondary | ICD-10-CM | POA: Insufficient documentation

## 2013-09-29 DIAGNOSIS — Z87891 Personal history of nicotine dependence: Secondary | ICD-10-CM | POA: Insufficient documentation

## 2013-09-29 DIAGNOSIS — M25551 Pain in right hip: Secondary | ICD-10-CM

## 2013-09-29 DIAGNOSIS — M25559 Pain in unspecified hip: Secondary | ICD-10-CM | POA: Insufficient documentation

## 2013-09-29 MED ORDER — MELOXICAM 7.5 MG PO TABS: 7.5000 mg | ORAL_TABLET | Freq: Every day | ORAL | Status: DC

## 2013-09-29 NOTE — Discharge Instructions (Signed)
Read the information below.  Use the prescribed medication as directed.  Please discuss all new medications with your pharmacist.  You may return to the Emergency Department at any time for worsening condition or any new symptoms that concern you.  If you develop uncontrolled pain, weakness or numbness of the extremity, severe discoloration of the skin, or you are unable to walk, return to the ER for a recheck.      Hip Pain The hips join the upper legs to the lower pelvis. The bones, cartilage, tendons, and muscles of the hip joint perform a lot of work each day holding your body weight and allowing you to move around. Hip pain is a common symptom. It can range from a minor ache to severe pain on 1 or both hips. Pain may be felt on the inside of the hip joint near the groin, or the outside near the buttocks and upper thigh. There may be swelling or stiffness as well. It occurs more often when a person walks or performs activity. There are many reasons hip pain can develop. CAUSES  It is important to work with your caregiver to identify the cause since many conditions can impact the bones, cartilage, muscles, and tendons of the hips. Causes for hip pain include:  Broken (fractured) bones.  Separation of the thighbone from the hip socket (dislocation).  Torn cartilage of the hip joint.  Swelling (inflammation) of a tendon (tendonitis), the sac within the hip joint (bursitis), or a joint.  A weakening in the abdominal wall (hernia), affecting the nerves to the hip.  Arthritis in the hip joint or lining of the hip joint.  Pinched nerves in the back, hip, or upper thigh.  A bulging disc in the spine (herniated disc).  Rarely, bone infection or cancer. DIAGNOSIS  The location of your hip pain will help your caregiver understand what may be causing the pain. A diagnosis is based on your medical history, your symptoms, results from your physical exam, and results from diagnostic tests. Diagnostic  tests may include X-ray exams, a computerized magnetic scan (magnetic resonance imaging, MRI), or bone scan. TREATMENT  Treatment will depend on the cause of your hip pain. Treatment may include:  Limiting activities and resting until symptoms improve.  Crutches or other walking supports (a cane or brace).  Ice, elevation, and compression.  Physical therapy or home exercises.  Shoe inserts or special shoes.  Losing weight.  Medications to reduce pain.  Undergoing surgery. HOME CARE INSTRUCTIONS   Only take over-the-counter or prescription medicines for pain, discomfort, or fever as directed by your caregiver.  Put ice on the injured area:  Put ice in a plastic bag.  Place a towel between your skin and the bag.  Leave the ice on for 15-20 minutes at a time, 03-04 times a day.  Keep your leg raised (elevated) when possible to lessen swelling.  Avoid activities that cause pain.  Follow specific exercises as directed by your caregiver.  Sleep with a pillow between your legs on your most comfortable side.  Record how often you have hip pain, the location of the pain, and what it feels like. This information may be helpful to you and your caregiver.  Ask your caregiver about returning to work or sports and whether you should drive.  Follow up with your caregiver for further exams, therapy, or testing as directed. SEEK MEDICAL CARE IF:   Your pain or swelling continues or worsens after 1 week.  You  are feeling unwell or have chills.  You have increasing difficulty with walking.  You have a loss of sensation or other new symptoms.  You have questions or concerns. SEEK IMMEDIATE MEDICAL CARE IF:   You cannot put weight on the affected hip.  You have fallen.  You have a sudden increase in pain and swelling in your hip.  You have a fever. MAKE SURE YOU:   Understand these instructions.  Will watch your condition.  Will get help right away if you are not  doing well or get worse. Document Released: 12/04/2009 Document Revised: 09/08/2011 Document Reviewed: 12/04/2009 [facility] Patient Information 2014 [place].    Emergency Department Resource Guide 1) Find a Doctor and Pay Out of Pocket Although you won't have to find out who is covered by your insurance plan, it is a good idea to ask around and get recommendations. You will then need to call the office and see if the doctor you have chosen will accept you as a new patient and what types of options they offer for patients who are self-pay. Some doctors offer discounts or will set up payment plans for their patients who do not have insurance, but you will need to ask so you aren't surprised when you get to your appointment.  2) Contact Your Local Health Department Not all health departments have doctors that can see patients for sick visits, but many do, so it is worth a call to see if yours does. If you don't know where your local health department is, you can check in your phone book. The CDC also has a tool to help you locate your state's health department, and many state websites also have listings of all of their local health departments.  3) Find a [facility] If your illness is not likely to be very severe or complicated, you may want to try a walk in clinic. These are popping up all over the country in pharmacies, drugstores, and shopping centers. They're usually staffed by nurse practitioners or physician assistants that have been trained to treat common illnesses and complaints. They're usually fairly quick and inexpensive. However, if you have serious medical issues or chronic medical problems, these are probably not your best option.  No Primary Care Doctor: - Call Health Connect at  [phone] - they can help you locate a primary care doctor that  accepts your insurance, provides certain services, etc. - Physician Referral Service- [phone]  Chronic Pain  Problems: Organization         Address  Phone   Notes  [facility]  [phone] Patients need to be referred by their primary care doctor.   Medication Assistance: Organization         Address  Phone   Notes  [facility] Medication [facility] [address] [place], [place] [address] [phone] --Must be a resident of [facility] -- Must have NO insurance coverage whatsoever (no Medicaid/ Medicare, etc.) -- The pt. MUST have a primary care doctor that directs their care regularly and follows them in the community   MedAssist  [phone]   [employer]  [phone]    Agencies that provide inexpensive medical care: Organization         Address  Phone   Notes  [place]  [phone]   [facility]    [phone]   [facility] [place],  [place] [address] [phone]   [place] [address], [place] [phone]   Planned Parenthood    [phone]   [facility]    [phone]   [place] and [place] [address], [place] Phone:  [phone], Fax:  [phone] Hours of Operation:  9 am - 6 pm, M-F.  Also accepts Medicaid/Medicare and self-pay.  [facility] for [place] [address], [address], [place] Phone: [phone], Fax: [phone]. Hours of Operation:  8:30 am - 5:30 pm, M-F.  Also accepts Medicaid and self-pay.  [facility] [place] [address] [place] Phone: [phone]   [place], [place], [place] [phone], Ext. [phone] Mondays & Thursdays: 7-9 AM.  First 15 patients are seen on a first come, first serve basis.    [place] Providers:  Organization         Address  Phone   Notes  [facility] [address], [place] [phone] Also  accepts self-pay patients.  [facility] [id number] [place], [place]  [phone]   [place], [address], [place] [phone]   [facility] [address], [place] [phone]   Lucianne Lei [address], [place]   [phone] Only accepts [place] Access [place] patients after they have their name applied to their card.   Self-Pay (no insurance) in [facility]:  Organization         Address  Phone   Notes  Sickle Cell Patients, [facility] [place] [phone]   [facility] [place] [phone]   [facility] [place]  [place], [place], [place] [phone]   [facility]/Dr. Osei-Bonsu  [address], [place] or [address], [place] [phone] Phone number for both [place] and [place] locations is the same.  Urgent Medical and [facility] [address] [place] [phone]   [facility] [address] [place] or [address] [phone]   [facility] [address] [place] [phone], phone; [phone], fax Sees patients 1st and 3rd Saturday of every month.  Must not qualify for public or private insurance (i.e. Medicaid, Medicare, [place] Health Choice, Veterans' Benefits)  Household income should be no more than 200% of the poverty level The clinic cannot treat you if you are pregnant or think you are pregnant  Sexually transmitted diseases are not treated at the clinic.    Dental Care: Organization         Address  Phone  Notes  [employer] Department of [facility] [place] [phone] Accepts children up to age 57 who are enrolled in [place] or [place]; pregnant  women with a Medicaid card; and children who have applied for Medicaid or [place] Health Choice, but were declined, whose parents can pay a reduced fee at time of service.  [facility] Department of [facility]  [address], [place] [phone] Accepts children up to age 29 who are enrolled in [place] or [place]; pregnant  women with a Medicaid card; and children who have applied for Medicaid or [place] Health Choice, but were declined, whose parents can pay a reduced fee at time of service.  [place] Adult Dental Access PROGRAM  [place] [phone] Patients are seen by appointment only. Walk-ins are not accepted. [place] will see patients 45 years of age and older. Monday - Tuesday (8am-5pm) Most Wednesdays (8:30-5pm) $30 per visit, cash only  [facility] Adult Dental Access PROGRAM  [address], [facility] [phone] Patients are seen by appointment only. Walk-ins are not accepted. [place] will see patients 39 years of age and older. One Wednesday Evening (Monthly: Volunteer Based).  $30 per visit, cash only  [place]  [phone] for adults; Children under age 65, call Graduate Pediatric Dentistry at [phone]. Children aged 74-14, please call [phone] to request a pediatric application.  Dental services are provided in all areas of dental care including fillings, crowns and bridges, complete and partial dentures, implants, gum treatment, root canals, and extractions. Preventive care is also provided. Treatment is provided to both adults and children. Patients are selected via a lottery and there is often a waiting list.   [facility] [address] [place]  [phone] [email or web address]   [facility] [address] [place], [place] [phone], Ext. [phone] Second and Fourth Thursday of each month, opens at 6:30 AM; Clinic ends at 9 AM.  Patients are  seen on a first-come first-served basis, and a limited number are seen during each clinic.   [facility]  [address] Hillard Danker [place], [place] [phone]   Eligibility Requirements You must have lived in [place], [place], or [place] counties for at least the last three months.   You cannot be eligible for state or federal sponsored [employer], including [employer], [place], or [employer].   You generally cannot be eligible for healthcare insurance through your employer.    How to apply: Eligibility screenings are held every Tuesday and Wednesday afternoon from 1:00 pm until 4:00 pm. You do not need an appointment for the interview!  [facility] [address] [place], [place]   [place]  [place] Department  [place]  [phone]    Behavioral Health Resources in the Community: Intensive Outpatient Programs Organization         Address  Phone  Notes  [facility] [address] [place], [place] [phone]   [facility]patient [address], [place], [place]   ADS: Alcohol & Drug S[facility]cs [address], [place], [place]   [place] [address]  [place], [place] or [phone]   Substance Abuse Resources Organization         Address  Phone  Notes  Alcohol and Drug Services  [phone]   [place]  [phone]   [facility] [place]   Chinita Pester  [phone]   Residential & Outpatient Substance Abuse Program  [phone]   Psychological Services Organization         Address  Phone  Notes  [facility] [place]  [place]  [phone]   [place] [address] [place] or [phone]    Mobile Crisis  Teams Organization         Address  Phone  Notes  [facility], Mobile Crisis Care Unit  [phone]   [facility]  [address] [place], [place]   [facility] [address] [place] [phone]    Self-Help/Support Groups Organization         Address  Phone             Notes  [place]. of [place] - variety of support groups  [name] Call for more information  Narcotics Anonymous (NA), [facility]aring [facility] [address], [employer] [place]  2 meetings at this location   Special educational needs teacher         Address  Phone  Notes  [facility]idential [facility] [place],    [place]  [phone]   [facility]  [address], [place] [address], [place], [place]   [place], [place] [phone] Admissions: 8am-3pm M-F  Incentives Substance [place] [address]    [place], [place] [phone]   [facility] [address] [place], [place], [place]   [facility] [address]  [place], [place]   [facility] - Intensive Outpatient [address], [place], [place]   [facility] ([place] [address]  [place], [place] [phone] or [phone]   [facility] Services (RTS) [address] [place], [place] Accepts Medicaid  [facility] [address],  [place] [phone] Substance Abuse/Addiction Treatment   [facility] Organization         Address  Phone  Notes  [facility]  [phone]   Domenic Schwab, PhD [address] Arlis Porta [place], [place]   [phone] or [phone]   [place], [place] [phone]   [facility] [address] [place], [place] [phone]  Insurance/Medicaid/sponsorship through [facility] and Families [address] [place]                                    [place], [place] [phone] [place] [address], [place] [phone]    Dr. Adele Schilder  [phone]   [facility] [place] Dept. 1) [address], [place] 2) [place] 3)  [address], [place] [phone]  [phone]   [place] [phone] or [phone] (After Hours)
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
## 2013-09-30 NOTE — ED Provider Notes (Signed)
Medical screening examination/treatment/procedure(s) were performed by non-physician practitioner and as supervising physician I was immediately available for consultation/collaboration.   EKG Interpretation None       Redford Behrle, MD 09/30/13 0931 

## 2013-11-10 ENCOUNTER — Emergency Department (HOSPITAL_COMMUNITY)
Admission: EM | Admit: 2013-11-10 | Discharge: 2013-11-10 | Disposition: A | Discharge status: Home / Self Care | Payer: Self-pay | Source: Home / Self Care | Attending: Family Medicine | Admitting: Family Medicine

## 2013-11-10 ENCOUNTER — Encounter (HOSPITAL_COMMUNITY): Payer: Self-pay | Admitting: Emergency Medicine

## 2013-11-10 DIAGNOSIS — J45909 Unspecified asthma, uncomplicated: Secondary | ICD-10-CM

## 2013-11-10 DIAGNOSIS — L259 Unspecified contact dermatitis, unspecified cause: Secondary | ICD-10-CM

## 2013-11-10 DIAGNOSIS — Z760 Encounter for issue of repeat prescription: Secondary | ICD-10-CM

## 2013-11-10 MED ORDER — TRIAMCINOLONE ACETONIDE 0.1 % EX CREA: 1.0000 "application " | TOPICAL_CREAM | Freq: Two times a day (BID) | CUTANEOUS | Status: DC

## 2013-11-10 MED ORDER — TRIAMCINOLONE ACETONIDE 0.1 % EX OINT: 1.0000 "application " | TOPICAL_OINTMENT | Freq: Two times a day (BID) | CUTANEOUS | Status: DC

## 2013-11-10 MED ORDER — ALBUTEROL SULFATE HFA 108 (90 BASE) MCG/ACT IN AERS: 2.0000 | INHALATION_SPRAY | Freq: Four times a day (QID) | RESPIRATORY_TRACT | Status: DC | PRN

## 2013-11-10 MED ORDER — ALBUTEROL SULFATE HFA 108 (90 BASE) MCG/ACT IN AERS
2.0000 | INHALATION_SPRAY | RESPIRATORY_TRACT | Status: DC | PRN
Start: 2013-11-10 — End: 2014-04-26

## 2013-11-10 NOTE — Discharge Instructions (Signed)
Please call the [facility] for a follow up appointment.  Medication Refill, Emergency Department We have refilled your medication today as a courtesy to you. It is best for your medical care, however, to take care of getting refills done through your primary caregiver's office. They have your records and can do a better job of follow-up than we can in the emergency department. On maintenance medications, we often only prescribe enough medications to get you by until you are able to see your regular caregiver. This is a more expensive way to refill medications. In the future, please plan for refills so that you will not have to use the emergency department for this. Thank you for your help. Your help allows [place] to better take care of the daily emergencies that enter our department. Document Released: 10/03/2003 Document Revised: 09/08/2011 Document Reviewed: 06/16/2005 [facility] Patient Information 2014 [place], [place].

## 2013-11-10 NOTE — ED Provider Notes (Signed)
CSN: [id number]     Arrival date & time 11/10/13  1100 History   None    Chief Complaint  Patient presents with  . Medication Refill   (Consider location/radiation/quality/duration/timing/severity/associated sxs/prior Treatment) The history is provided by the patient.  Adrian [name] is a 38 y.o. male who presents to the ED for medication refill. He states that he was seeing a doctor and had an orange card but his card ran out so he has not been back. He is out of his albuterol and has been using his son's when he has wheezing. He also has eczema and is out of his cream. He denies any problems at this time except the rash on his hands and wrist that is starting back since he is out of his cream.   Past Medical History  Diagnosis Date  . Asthma   . Eczema   . Migraine   . Foot fracture, left   . Multiple food allergies     fish, tomatoes, peanuts, eggs, chicken and others  . Environmental allergies     grass   Past Surgical History  Procedure Laterality Date  . Inguinal hernia repair  1994   Family History  Problem Relation Age of Onset  . Asthma Father   . Clotting disorder      Aunt  . Breast cancer      Grandmother  . Prostate cancer      uncle  . Diabetes Mother   . Hypertension Mother   . Hyperlipidemia      grandparent  . Hypertension      grandparent/grandparent  . Kidney disease      aunt/uncle/grandparent  . Stroke      grandparent   History  Substance Use Topics  . Smoking status: Former Smoker    Types: Cigars    Quit date: 09/13/2012  . Smokeless tobacco: Not on file  . Alcohol Use: No    Review of Systems Negative except as stated in HPI Allergies  Peanut-containing drug products; Shellfish allergy; and Tomato  Home Medications   Prior to Admission medications   Medication Sig Start Date End Date Taking? Authorizing Provider  albuterol (PROVENTIL HFA;VENTOLIN HFA) 108 (90 BASE) MCG/ACT inhaler Inhale 2 puffs into the lungs every 6 (six)  hours as needed for wheezing or shortness of breath.    Historical Provider, MD  Emollient (CETAPHIL) cream Apply 1 application topically as needed. 08/27/13   Clayton Bibles, PA-C  fluticasone (CUTIVATE) 0.05 % cream Apply topically 2 (two) times daily. 08/27/13   Clayton Bibles, PA-C  ibuprofen (ADVIL,MOTRIN) 200 MG tablet Take 400 mg by mouth every 6 (six) hours as needed (pain).    Historical Provider, MD  meloxicam (MOBIC) 7.5 MG tablet Take 1 tablet (7.5 mg total) by mouth daily. 09/29/13   Clayton Bibles, PA-C  Spacer/Aero-Holding Chambers (AEROCHAMBER MAX [facility]) MISC 1 each by Other route once. 01/26/12   Gerda Diss, DO  triamcinolone ointment (KENALOG) 0.1 % Apply 1 application topically daily as needed (eczema).     Historical Provider, MD   BP 148/82  Pulse 50  Temp(Src) 98.4 F (36.9 C) (Oral)  Resp 12  SpO2 99% Physical Exam  Nursing note and vitals reviewed. Constitutional: He is oriented to person, place, and time. He appears well-developed and well-nourished. No distress.  HENT:  Head: Normocephalic.  Eyes: Conjunctivae and EOM are normal.  Neck: Neck supple.  Cardiovascular: Regular rhythm.  Bradycardia present.   Pulmonary/Chest: Effort normal. He has  no wheezes. He has no rales.  Musculoskeletal: Normal range of motion.  Neurological: He is alert and oriented to person, place, and time. No cranial nerve deficit.  Skin: Skin is warm and dry.  Dry patchy areas hands and wrist  Psychiatric: He has a normal mood and affect. His behavior is normal.    ED Course  Procedures   MDM  38 y.o. male here for medication refill. Will refill today and refer to [facility]. Discussed need for PCP to refill meds and do routine care. Discussed with the patient and all questioned fully answered. He voices understanding.    Medication List    ASK your doctor about these medications       aerochamber max w/mask-large Misc  1 each by Other route once.     albuterol 108  (90 BASE) MCG/ACT inhaler  Commonly known as:  PROVENTIL HFA;VENTOLIN HFA  Inhale 2 puffs into the lungs every 4 (four) hours as needed for wheezing or shortness of breath.  Ask about: Which instructions should I use?     albuterol 108 (90 BASE) MCG/ACT inhaler  Commonly known as:  PROVENTIL HFA;VENTOLIN HFA  Inhale 2 puffs into the lungs every 6 (six) hours as needed for wheezing or shortness of breath.  Ask about: Which instructions should I use?     cetaphil cream  Apply 1 application topically as needed.     fluticasone 0.05 % cream  Commonly known as:  CUTIVATE  Apply topically 2 (two) times daily.     ibuprofen 200 MG tablet  Commonly known as:  ADVIL,MOTRIN  Take 400 mg by mouth every 6 (six) hours as needed (pain).     meloxicam 7.5 MG tablet  Commonly known as:  MOBIC  Take 1 tablet (7.5 mg total) by mouth daily.     triamcinolone cream 0.1 %  Commonly known as:  KENALOG  Apply 1 application topically 2 (two) times daily.  Ask about: Which instructions should I use?     triamcinolone ointment 0.1 %  Commonly known as:  KENALOG  Apply 1 application topically daily as needed (eczema).  Ask about: Which instructions should I use?         Ashley Murrain, [place] 11/10/13 [phone]

## 2013-11-10 NOTE — ED Provider Notes (Signed)
Medical screening examination/treatment/procedure(s) were performed by a resident physician or non-physician practitioner and as the supervising physician I was immediately available for consultation/collaboration.  Evan Corey, MD    Evan S Corey, MD 11/10/13 1309 

## 2013-11-10 NOTE — ED Notes (Signed)
Patient out of his Rx for skin problem and for asthma. Denies particular problems at present

## 2014-04-18 ENCOUNTER — Emergency Department (INDEPENDENT_AMBULATORY_CARE_PROVIDER_SITE_OTHER)
Admission: EM | Admit: 2014-04-18 | Discharge: 2014-04-18 | Disposition: A | Discharge status: Home / Self Care | Payer: Self-pay | Source: Home / Self Care | Attending: Family Medicine | Admitting: Family Medicine

## 2014-04-18 ENCOUNTER — Encounter (HOSPITAL_COMMUNITY): Payer: Self-pay | Admitting: Emergency Medicine

## 2014-04-18 DIAGNOSIS — L239 Allergic contact dermatitis, unspecified cause: Secondary | ICD-10-CM

## 2014-04-18 DIAGNOSIS — L200 Besnier's prurigo: Secondary | ICD-10-CM

## 2014-04-18 DIAGNOSIS — J4520 Mild intermittent asthma, uncomplicated: Secondary | ICD-10-CM

## 2014-04-18 MED ORDER — ALBUTEROL SULFATE HFA 108 (90 BASE) MCG/ACT IN AERS: 1.0000 | INHALATION_SPRAY | Freq: Four times a day (QID) | RESPIRATORY_TRACT | Status: DC | PRN

## 2014-04-18 MED ORDER — TRIAMCINOLONE ACETONIDE 0.025 % EX OINT: 1.0000 "application " | TOPICAL_OINTMENT | Freq: Two times a day (BID) | CUTANEOUS | Status: DC

## 2014-04-18 MED ORDER — TRIAMCINOLONE ACETONIDE 40 MG/ML IJ SUSP
INTRAMUSCULAR | Status: AC
  Filled 2014-04-18: qty 1

## 2014-04-18 MED ORDER — TRIAMCINOLONE ACETONIDE 40 MG/ML IJ SUSP
40.0000 mg | Freq: Once | INTRAMUSCULAR | Status: AC
  Administered 2014-04-18: 40 mg via INTRAMUSCULAR

## 2014-04-18 MED ORDER — METHYLPREDNISOLONE ACETATE 40 MG/ML IJ SUSP
80.0000 mg | Freq: Once | INTRAMUSCULAR | Status: AC
  Administered 2014-04-18: 80 mg via INTRAMUSCULAR

## 2014-04-18 MED ORDER — METHYLPREDNISOLONE ACETATE 80 MG/ML IJ SUSP
INTRAMUSCULAR | Status: AC
  Filled 2014-04-18: qty 1

## 2014-04-18 NOTE — ED Notes (Signed)
Pt  Has  Symptoms  Of  Rash       On  Hands   And  Arms       -  Pt  Has     exceyma       And  Asthma    -      Pt   Also reports  Diarrhea   For  sev  Days  And     Is  Requesting  A  Refill of   His  Albuterol       And  He  Is  In  No  Acute  Distress

## 2014-04-18 NOTE — ED Provider Notes (Signed)
CSN: [id number]     Arrival date & time 04/18/14  8413 History   First MD Initiated Contact with Patient 04/18/14 [phone]     Chief Complaint  Patient presents with  . Rash   (Consider location/radiation/quality/duration/timing/severity/associated sxs/prior Treatment) Patient is a 38 y.o. male presenting with rash. The history is provided by the patient.  Rash Location:  Hand and shoulder/arm Shoulder/arm rash location:  L forearm, L hand, R hand and R forearm Hand rash location:  R palm and L palm Quality: dryness, itchiness and scaling   Severity:  Moderate Chronicity:  Chronic Relieved by:  Topical steroids Associated symptoms: shortness of breath and wheezing     Past Medical History  Diagnosis Date  . Asthma   . Eczema   . Migraine   . Foot fracture, left   . Multiple food allergies     fish, tomatoes, peanuts, eggs, chicken and others  . Environmental allergies     grass   Past Surgical History  Procedure Laterality Date  . Inguinal hernia repair  1994   Family History  Problem Relation Age of Onset  . Asthma Father   . Clotting disorder      Aunt  . Breast cancer      Grandmother  . Prostate cancer      uncle  . Diabetes Mother   . Hypertension Mother   . Hyperlipidemia      grandparent  . Hypertension      grandparent/grandparent  . Kidney disease      aunt/uncle/grandparent  . Stroke      grandparent   History  Substance Use Topics  . Smoking status: Former Smoker    Types: Cigars    Quit date: 09/13/2012  . Smokeless tobacco: Not on file  . Alcohol Use: No    Review of Systems  Constitutional: Negative.   Respiratory: Positive for shortness of breath and wheezing.   Skin: Positive for rash.    Allergies  Peanut-containing drug products; Shellfish allergy; and Tomato  Home Medications   Prior to Admission medications   Medication Sig Start Date End Date Taking? Authorizing Provider  albuterol (PROVENTIL HFA;VENTOLIN HFA) 108 (90  BASE) MCG/ACT inhaler Inhale 2 puffs into the lungs every 4 (four) hours as needed for wheezing or shortness of breath. 11/10/13   Hope Bunnie Pion, NP  albuterol (PROVENTIL HFA;VENTOLIN HFA) 108 (90 BASE) MCG/ACT inhaler Inhale 2 puffs into the lungs every 6 (six) hours as needed for wheezing or shortness of breath. 11/10/13   Hope Bunnie Pion, NP  albuterol (PROVENTIL HFA;VENTOLIN HFA) 108 (90 BASE) MCG/ACT inhaler Inhale 1-2 puffs into the lungs every 6 (six) hours as needed for wheezing or shortness of breath. 04/18/14   Billy Fischer, MD  Emollient (CETAPHIL) cream Apply 1 application topically as needed. 08/27/13   Clayton Bibles, PA-C  fluticasone (CUTIVATE) 0.05 % cream Apply topically 2 (two) times daily. 08/27/13   Clayton Bibles, PA-C  ibuprofen (ADVIL,MOTRIN) 200 MG tablet Take 400 mg by mouth every 6 (six) hours as needed (pain).    Historical Provider, MD  meloxicam (MOBIC) 7.5 MG tablet Take 1 tablet (7.5 mg total) by mouth daily. 09/29/13   Clayton Bibles, PA-C  Spacer/Aero-Holding Chambers (AEROCHAMBER MAX [facility]) MISC 1 each by Other route once. 01/26/12   Gerda Diss, DO  triamcinolone (KENALOG) 0.025 % ointment Apply 1 application topically 2 (two) times daily. 04/18/14   Billy Fischer, MD  triamcinolone cream (KENALOG) 0.1 % Apply  1 application topically 2 (two) times daily. 11/10/13   Hope Bunnie Pion, NP  triamcinolone ointment (KENALOG) 0.1 % Apply 1 application topically 2 (two) times daily. 11/10/13   Hope Bunnie Pion, NP   BP 131/87  Pulse 46  Temp(Src) 98.4 F (36.9 C) (Oral)  Resp 16  SpO2 100% Physical Exam  Nursing note and vitals reviewed. Constitutional: He is oriented to person, place, and time. He appears well-developed and well-nourished.  Neck: Normal range of motion. Neck supple.  Cardiovascular: Normal heart sounds.   Pulmonary/Chest: Effort normal and breath sounds normal. He has no wheezes.  Neurological: He is alert and oriented to person, place, and time.  Skin: Skin is warm  and dry. Rash noted.    ED Course  Procedures (including critical care time) Labs Review Labs Reviewed - No data to display  Imaging Review No results found.   MDM   1. Eczema, allergic   2. Asthma in adult, mild intermittent, uncomplicated        Billy Fischer, MD 04/18/14 [phone]

## 2014-04-26 ENCOUNTER — Encounter (HOSPITAL_COMMUNITY): Payer: Self-pay | Admitting: Emergency Medicine

## 2014-04-26 ENCOUNTER — Emergency Department (HOSPITAL_COMMUNITY)
Admission: EM | Admit: 2014-04-26 | Discharge: 2014-04-26 | Disposition: A | Discharge status: Home / Self Care | Payer: Self-pay | Attending: Emergency Medicine | Admitting: Emergency Medicine

## 2014-04-26 DIAGNOSIS — Z7952 Long term (current) use of systemic steroids: Secondary | ICD-10-CM | POA: Insufficient documentation

## 2014-04-26 DIAGNOSIS — J45909 Unspecified asthma, uncomplicated: Secondary | ICD-10-CM | POA: Insufficient documentation

## 2014-04-26 DIAGNOSIS — G8921 Chronic pain due to trauma: Secondary | ICD-10-CM | POA: Insufficient documentation

## 2014-04-26 DIAGNOSIS — M79604 Pain in right leg: Secondary | ICD-10-CM | POA: Insufficient documentation

## 2014-04-26 DIAGNOSIS — Z87828 Personal history of other (healed) physical injury and trauma: Secondary | ICD-10-CM | POA: Insufficient documentation

## 2014-04-26 DIAGNOSIS — Z8679 Personal history of other diseases of the circulatory system: Secondary | ICD-10-CM | POA: Insufficient documentation

## 2014-04-26 DIAGNOSIS — Z791 Long term (current) use of non-steroidal anti-inflammatories (NSAID): Secondary | ICD-10-CM | POA: Insufficient documentation

## 2014-04-26 DIAGNOSIS — G8929 Other chronic pain: Secondary | ICD-10-CM

## 2014-04-26 DIAGNOSIS — M79601 Pain in right arm: Secondary | ICD-10-CM

## 2014-04-26 DIAGNOSIS — Z79899 Other long term (current) drug therapy: Secondary | ICD-10-CM | POA: Insufficient documentation

## 2014-04-26 DIAGNOSIS — Z8781 Personal history of (healed) traumatic fracture: Secondary | ICD-10-CM | POA: Insufficient documentation

## 2014-04-26 DIAGNOSIS — Z87891 Personal history of nicotine dependence: Secondary | ICD-10-CM | POA: Insufficient documentation

## 2014-04-26 DIAGNOSIS — Z872 Personal history of diseases of the skin and subcutaneous tissue: Secondary | ICD-10-CM | POA: Insufficient documentation

## 2014-04-26 MED ORDER — TRIAMCINOLONE ACETONIDE 0.5 % EX OINT: 1.0000 "application " | TOPICAL_OINTMENT | Freq: Two times a day (BID) | CUTANEOUS | Status: DC

## 2014-04-26 MED ORDER — TRAMADOL HCL 50 MG PO TABS: 50.0000 mg | ORAL_TABLET | Freq: Four times a day (QID) | ORAL | Status: DC | PRN
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
## 2014-04-28 NOTE — ED Provider Notes (Signed)
Medical screening examination/treatment/procedure(s) were performed by non-physician practitioner and as supervising physician I was immediately available for consultation/collaboration.   EKG Interpretation None       Orlie Dakin, MD 04/28/14 1545

## 2014-05-19 ENCOUNTER — Emergency Department (HOSPITAL_COMMUNITY)
Admission: EM | Admit: 2014-05-19 | Discharge: 2014-05-19 | Disposition: A | Discharge status: Home / Self Care | Payer: Self-pay | Attending: Emergency Medicine | Admitting: Emergency Medicine

## 2014-05-19 ENCOUNTER — Encounter (HOSPITAL_COMMUNITY): Payer: Self-pay | Admitting: Emergency Medicine

## 2014-05-19 DIAGNOSIS — Z87891 Personal history of nicotine dependence: Secondary | ICD-10-CM | POA: Insufficient documentation

## 2014-05-19 DIAGNOSIS — J111 Influenza due to unidentified influenza virus with other respiratory manifestations: Secondary | ICD-10-CM | POA: Insufficient documentation

## 2014-05-19 DIAGNOSIS — Z91040 Latex allergy status: Secondary | ICD-10-CM | POA: Insufficient documentation

## 2014-05-19 DIAGNOSIS — Z79899 Other long term (current) drug therapy: Secondary | ICD-10-CM | POA: Insufficient documentation

## 2014-05-19 DIAGNOSIS — R69 Illness, unspecified: Secondary | ICD-10-CM

## 2014-05-19 DIAGNOSIS — J45909 Unspecified asthma, uncomplicated: Secondary | ICD-10-CM | POA: Insufficient documentation

## 2014-05-19 DIAGNOSIS — Z8679 Personal history of other diseases of the circulatory system: Secondary | ICD-10-CM | POA: Insufficient documentation

## 2014-05-19 DIAGNOSIS — R001 Bradycardia, unspecified: Secondary | ICD-10-CM | POA: Insufficient documentation

## 2014-05-19 DIAGNOSIS — Z872 Personal history of diseases of the skin and subcutaneous tissue: Secondary | ICD-10-CM | POA: Insufficient documentation

## 2014-05-19 NOTE — ED Notes (Signed)
Pt. reports sore throat with , body aches , productive cough and chills onset yesterday .

## 2014-05-19 NOTE — ED Notes (Signed)
Pt A&OX4, AMBULATORY AT D/C WITH STEADY GAIT, NAD
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
## 2014-05-19 NOTE — Discharge Instructions (Signed)
Acute Bronchitis Take Tylenol as needed for aches. Use your albuterol inhaler 2 puffs every 4 hours as needed for cough or shortness of breath. See an urgent care center if not better in a week or call any of the numbers on the resource guide to get a primary care physician. Ask your new primary care physician to help you to stop smoking Bronchitis is when the airways that extend from the windpipe into the lungs get red, puffy, and painful (inflamed). Bronchitis often causes thick spit (mucus) to develop. This leads to a cough. A cough is the most common symptom of bronchitis. In acute bronchitis, the condition usually begins suddenly and goes away over time (usually in 2 weeks). Smoking, allergies, and asthma can make bronchitis worse. Repeated episodes of bronchitis may cause more lung problems. HOME CARE  Rest.  Drink enough fluids to keep your pee (urine) clear or pale yellow (unless you need to limit fluids as told by your doctor).  Only take over-the-counter or prescription medicines as told by your doctor.  Avoid smoking and secondhand smoke. These can make bronchitis worse. If you are a smoker, think about using nicotine gum or skin patches. Quitting smoking will help your lungs heal faster.  Reduce the chance of getting bronchitis again by:  Washing your hands often.  Avoiding people with cold symptoms.  Trying not to touch your hands to your mouth, nose, or eyes.  Follow up with your doctor as told. GET HELP IF: Your symptoms do not improve after 1 week of treatment. Symptoms include:  Cough.  Fever.  Coughing up thick spit.  Body aches.  Chest congestion.  Chills.  Shortness of breath.  Sore throat. GET HELP RIGHT AWAY IF:   You have an increased fever.  You have chills.  You have severe shortness of breath.  You have bloody thick spit (sputum).  You throw up (vomit) often.  You lose too much body fluid (dehydration).  You have a severe  headache.  You faint. MAKE SURE YOU:   Understand these instructions.  Will watch your condition.  Will get help right away if you are not doing well or get worse. Document Released: 12/03/2007 Document Revised: 02/16/2013 Document Reviewed: 12/07/2012 [facility] Patient Information 2015 [place], [place]. This information is not intended to replace advice given to you by your health care provider. Make sure you discuss any questions you have with your health care provider.  Acute Bronchitis Bronchitis is when the airways that extend from the windpipe into the lungs get red, puffy, and painful (inflamed). Bronchitis often causes thick spit (mucus) to develop. This leads to a cough. A cough is the most common symptom of bronchitis. In acute bronchitis, the condition usually begins suddenly and goes away over time (usually in 2 weeks). Smoking, allergies, and asthma can make bronchitis worse. Repeated episodes of bronchitis may cause more lung problems. HOME CARE  Rest.  Drink enough fluids to keep your pee (urine) clear or pale yellow (unless you need to limit fluids as told by your doctor).  Only take over-the-counter or prescription medicines as told by your doctor.  Avoid smoking and secondhand smoke. These can make bronchitis worse. If you are a smoker, think about using nicotine gum or skin patches. Quitting smoking will help your lungs heal faster.  Reduce the chance of getting bronchitis again by:  Washing your hands often.  Avoiding people with cold symptoms.  Trying not to touch your hands to your mouth, nose, or eyes.  Follow up with your doctor as told. GET HELP IF: Your symptoms do not improve after 1 week of treatment. Symptoms include:  Cough.  Fever.  Coughing up thick spit.  Body aches.  Chest congestion.  Chills.  Shortness of breath.  Sore throat. GET HELP RIGHT AWAY IF:   You have an increased fever.  You have chills.  You have severe shortness  of breath.  You have bloody thick spit (sputum).  You throw up (vomit) often.  You lose too much body fluid (dehydration).  You have a severe headache.  You faint. MAKE SURE YOU:   Understand these instructions.  Will watch your condition.  Will get help right away if you are not doing well or get worse. Document Released: 12/03/2007 Document Revised: 02/16/2013 Document Reviewed: 12/07/2012 [facility] Patient Information 2015 [place], [place]. This information is not intended to replace advice given to you by your health care provider. Make sure you discuss any questions you have with your health care provider.  Emergency Department Resource Guide 1) Find a Doctor and Pay Out of Pocket Although you won't have to find out who is covered by your insurance plan, it is a good idea to ask around and get recommendations. You will then need to call the office and see if the doctor you have chosen will accept you as a new patient and what types of options they offer for patients who are self-pay. Some doctors offer discounts or will set up payment plans for their patients who do not have insurance, but you will need to ask so you aren't surprised when you get to your appointment.  2) Contact Your Local Health Department Not all health departments have doctors that can see patients for sick visits, but many do, so it is worth a call to see if yours does. If you don't know where your local health department is, you can check in your phone book. The CDC also has a tool to help you locate your state's health department, and many state websites also have listings of all of their local health departments.  3) Find a [facility] If your illness is not likely to be very severe or complicated, you may want to try a walk in clinic. These are popping up all over the country in pharmacies, drugstores, and shopping centers. They're usually staffed by nurse practitioners or physician assistants that have  been trained to treat common illnesses and complaints. They're usually fairly quick and inexpensive. However, if you have serious medical issues or chronic medical problems, these are probably not your best option.  No Primary Care Doctor: - Call Health Connect at  [phone] - they can help you locate a primary care doctor that  accepts your insurance, provides certain services, etc. - Physician Referral Service- [phone]  Chronic Pain Problems: Organization         Address  Phone   Notes  [facility]  [phone] Patients need to be referred by their primary care doctor.   Medication Assistance: Organization         Address  Phone   Notes  [facility] Medication [facility] [address] [place], [place] [address] [phone] --Must be a resident of [facility] -- Must have NO insurance coverage whatsoever (no Medicaid/ Medicare, etc.) -- The pt. MUST have a primary care doctor that directs their care regularly and follows them in the community   MedAssist  [phone]  [employer]  [phone]    Agencies that provide inexpensive medical care: Organization         Address  Phone   Notes  [place]  [phone]   [facility]    [phone]   [facility] [place], [place] [address] [phone]   [place] [address], [place] [phone]   Planned Parenthood    [phone]   [facility]    [phone]   [place] and [place] [address], [place] Phone:  [phone], Fax:  [phone] Hours of Operation:  9 am - 6 pm, M-F.  Also accepts Medicaid/Medicare and self-pay.  [facility], [address], [place] Phone: [phone], Fax: [phone]. Hours of Operation:  8:30 am - 5:30 pm, M-F.  Also accepts Medicaid and  self-pay.  [facility] [place] [address], [place] Phone: [phone]   [place], [place], [place] [phone], Ext. [phone] Mondays & Thursdays: 7-9 AM.  First 15 patients are seen on a first come, first serve basis.    [place] Providers:  Organization         Address  Phone   Notes  [facility] [address], [place] [phone] Also accepts self-pay patients.  [facility] [address], [place]  [phone]   [place], [address], [place] [phone]   [facility] [address] [place] [phone]   Lucianne Lei [address], [place]   [phone] Only accepts [place] Access [place] patients after they have their name applied to their card.   Self-Pay (no insurance) in [facility]:  Organization         Address  Phone   Notes  Sickle Cell Patients, [facility] [place] [phone]   [facility] [place] [phone]   [facility] [place]  [place], [place], [place] [phone]   [facility]/Dr. Osei-Bonsu  [address] [place] or [address], [place] [phone] Phone number for both [place] and [place] locations is the same.  Urgent Medical and [facility] [address], [place] [phone]   [facility] [address] [place] or [address] [phone]   [facility] [address] [place] [phone], phone; [phone], fax Sees patients 1st and 3rd Saturday of every month.  Must not qualify for public or private insurance (i.e. Medicaid, Medicare, [place] Health Choice, Veterans' Benefits)  Household income  should be no more than 200% of the poverty level The clinic cannot treat you if you are pregnant or think you are pregnant  Sexually transmitted diseases are not treated at the clinic.    Dental Care: Organization         Address  Phone  Notes  [facility] Department of [facility] [place] [phone] Accepts children up to age 35 who are enrolled in [place] or [place]; pregnant women with a Medicaid card;  and children who have applied for Medicaid or [place] Health Choice, but were declined, whose parents can pay a reduced fee at time of service.  [facility] Department of [facility]  [address], [place] [phone] Accepts children up to age 86 who are enrolled in [place] or [place]; pregnant women with a Medicaid card; and children who have applied for Medicaid or [place] Health Choice, but were declined, whose parents can pay a reduced fee at time of service.  [place] Adult [facility] Access PROGRAM  [place] [phone] Patients are seen by appointment only. Walk-ins are not accepted. [place] will see patients 54 years of age and older. Monday - Tuesday (8am-5pm) Most Wednesdays (8:30-5pm) $30 per visit, cash only  [place] Adult Dental Access PROGRAM  [address], [facility] [phone] Patients are seen by appointment only. Walk-ins are not accepted. [place] will see patients 34 years of age and older. One Wednesday Evening (Monthly: Volunteer Based).  $30 per visit, cash only  [place]  [phone] for adults; Children under age 32, call Graduate Pediatric Dentistry at [phone]. Children aged 75-14, please call [phone] to request a pediatric application.  Dental services are provided in all areas of dental care including fillings, crowns and bridges, complete and partial dentures, implants, gum treatment,  root canals, and extractions. Preventive care is also provided. Treatment is provided to both adults and children. Patients are selected via a lottery and there is often a waiting list.   [facility] [address], [place]  [phone] [email or web address]   [facility] [address] [place], [place] [phone], Ext. [phone] Second and Fourth Thursday of each month, opens at 6:30 AM; Clinic ends at 9 AM.  Patients are seen on a first-come first-served basis, and a limited number are seen during each clinic.   [facility]  [address] Hillard Danker [place], [place] [phone]   Eligibility Requirements You must have lived in [place], [place], or [place] counties for at least the last three months.   You cannot be eligible for state or federal sponsored [employer], including [employer], [place], or [employer].   You generally cannot be eligible for healthcare insurance through your employer.    How to apply: Eligibility screenings are held every Tuesday and Wednesday afternoon from 1:00 pm until 4:00 pm. You do not need an appointment for the interview!  [facility] [address] [place], [place]   [place]  [place] Department  [place]  [phone]    Behavioral Health Resources in the Community: Intensive Outpatient Programs Organization         Address  Phone  Notes  [place] [facility] [place]. [address] [place], [place] [phone]   [facility] Outpatient [address], [place], [place]   ADS: Alcohol & Drug Svcs [address] [place], [place]   [place] [address]  [place], [place] or [phone]   Substance Abuse Resources Organization         Address  Phone  Notes  Alcohol and Drug Services   [place]  [phone]   The [place]   [facility]  [phone]  Residential & Outpatient Substance Abuse Program  [phone]   Psychological Services Organization         Address  Phone  Notes  [facility] [place]  [place]  [phone]   [place] [phone] N[address] [place] or [phone]    Mobile Crisis Teams Organization         Address  Phone  Notes  [facility], Mobile Crisis Care Unit  [phone]   [facility]  [address] [place], [place]   [place] Levels [address] [place] [phone]    Self-Help/Support Groups Organization         Address  Phone             Notes  [place]. of [place] - variety of support groups  [place] Call for more information  Narcotics Anonymous (NA), [facility]aring [facility] [address], [employer] [place]  2 meetings at this location   Special educational needs teacher         Address  Phone  Notes  ASAP Residential Treatment [place],    [place]  [phone]   [facility]  [address], [place] [id number], [place], [place]   [place], [place] [phone] Admissions: 8am-3pm M-F  Incentives Substance [place] [address],    [place], [place] [phone]   [facility] [address] [place], [place], [place]   [facility] [address]  [place], [place]   Insight Programs - Intensive Outpatient [address], [place], [place]   [facility] ([place].,  [place], [place] [phone] or [phone]   [facility]idential Treatment Services (RTS) [address], [place], [place] Accepts Medicaid  [facility] [place] [address],  [place] [phone] Substance Abuse/Addiction Treatment   [facility] Organization         Address  Phone  Notes  [facility]  [phone]   Domenic Schwab, PhD [address] [place], [place]   [phone] or [phone]   [place], [place] [phone]   [facility] [address] [place], [place] [phone] Insurance/Medicaid/sponsorship through [facility] and Families [address]                                    [place], [place] [phone] [place] [address], [place] [phone]    Dr. Adele Schilder  [phone]   [facility] Dept. 1) [address] [place] 2) [place] 3)  [place] [address], [place] [phone]  [phone]   [place] [phone] or [phone] (After Hours)

## 2014-05-19 NOTE — ED Notes (Signed)
MD at bedside. 

## 2014-05-23 ENCOUNTER — Encounter (HOSPITAL_COMMUNITY): Payer: Self-pay | Admitting: Emergency Medicine

## 2014-05-23 ENCOUNTER — Emergency Department (HOSPITAL_COMMUNITY)
Admission: EM | Admit: 2014-05-23 | Discharge: 2014-05-23 | Disposition: A | Discharge status: Home / Self Care | Payer: Self-pay | Attending: Emergency Medicine | Admitting: Emergency Medicine

## 2014-05-23 DIAGNOSIS — Z91040 Latex allergy status: Secondary | ICD-10-CM | POA: Insufficient documentation

## 2014-05-23 DIAGNOSIS — B9789 Other viral agents as the cause of diseases classified elsewhere: Secondary | ICD-10-CM

## 2014-05-23 DIAGNOSIS — Z87891 Personal history of nicotine dependence: Secondary | ICD-10-CM | POA: Insufficient documentation

## 2014-05-23 DIAGNOSIS — J029 Acute pharyngitis, unspecified: Secondary | ICD-10-CM | POA: Insufficient documentation

## 2014-05-23 DIAGNOSIS — J069 Acute upper respiratory infection, unspecified: Secondary | ICD-10-CM | POA: Insufficient documentation

## 2014-05-23 DIAGNOSIS — Z79899 Other long term (current) drug therapy: Secondary | ICD-10-CM | POA: Insufficient documentation

## 2014-05-23 DIAGNOSIS — Z8781 Personal history of (healed) traumatic fracture: Secondary | ICD-10-CM | POA: Insufficient documentation

## 2014-05-23 DIAGNOSIS — J45901 Unspecified asthma with (acute) exacerbation: Secondary | ICD-10-CM | POA: Insufficient documentation

## 2014-05-23 DIAGNOSIS — Z8669 Personal history of other diseases of the nervous system and sense organs: Secondary | ICD-10-CM | POA: Insufficient documentation

## 2014-05-23 DIAGNOSIS — L309 Dermatitis, unspecified: Secondary | ICD-10-CM | POA: Insufficient documentation

## 2014-05-23 MED ORDER — PREDNISONE 20 MG PO TABS
40.0000 mg | ORAL_TABLET | Freq: Every day | ORAL | Status: DC
Start: 2014-05-23 — End: 2014-06-23

## 2014-05-23 MED ORDER — BENZONATATE 100 MG PO CAPS: 200.0000 mg | ORAL_CAPSULE | Freq: Two times a day (BID) | ORAL | Status: DC | PRN

## 2014-05-23 NOTE — ED Provider Notes (Signed)
CSN: [id number]     Arrival date & time 05/23/14  1702 History  This chart was scribed for non-physician practitioner, Abigail Butts, PA-C, working with Evelina Bucy, MD, by Delphia Grates, ED Scribe. This patient was seen in room TR05C/TR05C and the patient's care was started at 7:02 PM.    Chief Complaint  Patient presents with  . Cough  . Sore Throat    The history is provided by the patient and medical records. No language interpreter was used.     HPI Comments: Adrian [name] is a 38 y.o. male, with history of asthma, who presents to the Emergency Department complaining of persistent cough onset 6 days ago. There is associated sore throat and generalized throbbing HA due to cough. He denies sick contacts. Patient was seen here 5 days ago for SOB and cough, and was given albuterol. Patient reports taking albuterol daily since onset of symptoms with moderate relief. He reports the cough is worse at night. He states his last albuterol treatment was today. He reports he was not given prednisone for his asthma at the last visit.  Patient also reports taking Mucinex, but reports this causes abdominal discomfort. He denies fever or chills. Patient is a Librarian, academic at [employer], and requesting a note for work.    Past Medical History  Diagnosis Date  . Asthma   . Eczema   . Migraine   . Foot fracture, left   . Multiple food allergies     fish, tomatoes, peanuts, eggs, chicken and others  . Environmental allergies     grass   Past Surgical History  Procedure Laterality Date  . Inguinal hernia repair  1994   Family History  Problem Relation Age of Onset  . Asthma Father   . Clotting disorder      Aunt  . Breast cancer      Grandmother  . Prostate cancer      uncle  . Diabetes Mother   . Hypertension Mother   . Hyperlipidemia      grandparent  . Hypertension      grandparent/grandparent  . Kidney disease      aunt/uncle/grandparent  . Stroke      grandparent    History  Substance Use Topics  . Smoking status: Former Smoker    Types: Cigars    Quit date: 09/13/2012  . Smokeless tobacco: Not on file  . Alcohol Use: No    Review of Systems  Constitutional: Positive for fatigue. Negative for fever, chills and appetite change.  HENT: Positive for congestion, postnasal drip, rhinorrhea, sinus pressure and sore throat. Negative for ear discharge, ear pain and mouth sores.   Eyes: Negative for visual disturbance.  Respiratory: Positive for cough and chest tightness. Negative for shortness of breath, wheezing and stridor.   Cardiovascular: Negative for chest pain, palpitations and leg swelling.  Gastrointestinal: Negative for nausea, vomiting, abdominal pain and diarrhea.  Genitourinary: Negative for dysuria, urgency, frequency and hematuria.  Musculoskeletal: Negative for myalgias, back pain, arthralgias and neck stiffness.  Skin: Negative for rash.  Neurological: Positive for headaches. Negative for syncope, light-headedness and numbness.  Hematological: Negative for adenopathy.  Psychiatric/Behavioral: The patient is not nervous/anxious.   All other systems reviewed and are negative.     Allergies  Peanut-containing drug products; Shellfish allergy; Tomato; and Latex  Home Medications   Prior to Admission medications   Medication Sig Start Date End Date Taking? Authorizing Provider  albuterol (PROVENTIL HFA;VENTOLIN HFA) 108 (90 BASE) MCG/ACT  inhaler Inhale 1-2 puffs into the lungs every 6 (six) hours as needed for wheezing or shortness of breath. 04/18/14  Yes Billy Fischer, MD  meloxicam (MOBIC) 7.5 MG tablet Take 7.5 mg by mouth daily as needed for pain.   Yes Historical Provider, MD  traMADol (ULTRAM) 50 MG tablet Take 50 mg by mouth every 6 (six) hours as needed for moderate pain.   Yes Historical Provider, MD  triamcinolone ointment (KENALOG) 0.5 % Apply 1 application topically 2 (two) times daily. 04/26/14  Yes Elwyn Lade,  PA-C  benzonatate (TESSALON) 100 MG capsule Take 2 capsules (200 mg total) by mouth 2 (two) times daily as needed for cough. 05/23/14   Kestrel Mis, PA-C  predniSONE (DELTASONE) 20 MG tablet Take 2 tablets (40 mg total) by mouth daily. 05/23/14   Jarrett Soho Haldon Carley, PA-C  Spacer/Aero-Holding Chambers (AEROCHAMBER MAX [facility]) MISC 1 each by Other route once. 01/26/12   Gerda Diss, DO  traMADol (ULTRAM) 50 MG tablet Take 1 tablet (50 mg total) by mouth every 6 (six) hours as needed. Patient not taking: Reported on 05/23/2014 04/26/14   Elwyn Lade, PA-C   Triage Vitals: BP 119/73 mmHg  Pulse 60  Temp(Src) 98.1 F (36.7 C)  Resp 18  Ht 5\' 11"  (1.803 m)  Wt 215 lb (97.523 kg)  BMI 30.00 kg/m2  SpO2 97%  Physical Exam  Constitutional: He is oriented to person, place, and time. He appears well-developed and well-nourished. No distress.  HENT:  Head: Normocephalic and atraumatic.  Right Ear: Tympanic membrane, external ear and ear canal normal.  Left Ear: Tympanic membrane, external ear and ear canal normal.  Nose: Mucosal edema and rhinorrhea present. No epistaxis. Right sinus exhibits no maxillary sinus tenderness and no frontal sinus tenderness. Left sinus exhibits no maxillary sinus tenderness and no frontal sinus tenderness.  Mouth/Throat: Uvula is midline and mucous membranes are normal. Mucous membranes are not pale and not cyanotic. No oropharyngeal exudate, posterior oropharyngeal edema, posterior oropharyngeal erythema or tonsillar abscesses.  Eyes: Conjunctivae are normal. Pupils are equal, round, and reactive to light.  Neck: Normal range of motion and full passive range of motion without pain.  Cardiovascular: Normal rate and intact distal pulses.   Pulmonary/Chest: Effort normal and breath sounds normal. No stridor.  Clear and equal breath sounds without focal wheezes, rhonchi, rales Dry, nonproductive cough.  Good tidal volume. No diminished breath  sounds.  Abdominal: Soft. Bowel sounds are normal. There is no tenderness.  Musculoskeletal: Normal range of motion.  Lymphadenopathy:    He has no cervical adenopathy.  Neurological: He is alert and oriented to person, place, and time.  Skin: Skin is warm and dry. No rash noted. He is not diaphoretic.  Psychiatric: He has a normal mood and affect.  Nursing note and vitals reviewed.   ED Course  Procedures (including critical care time)  DIAGNOSTIC STUDIES: Oxygen Saturation is 97% on room air, adequate by my interpretation.    COORDINATION OF CARE: At 1906 Discussed treatment plan with patient which includes prednisone and work note. Patient agrees.   Labs Review Labs Reviewed - No data to display  Imaging Review No results found.   EKG Interpretation None      MDM   Final diagnoses:  Viral URI with cough   Adrian [name] presents with persistent cough from viral URI.  She'll history of asthma, no wheezing on exam today. Patient is reported moderate relief from his albuterols however he is not  getting complete relief. Patient was seen here 05/19/2014 and was not discharged home with prednisone. We'll discharge home with Tessalon and prednisone. No acute asthma exacerbation today.  Patient reports his primary concern is that he needs a note for work for this week as he continues to have cough and he works around food.  I have personally reviewed patient's vitals, nursing note and any pertinent labs or imaging.  I performed an focused physical exam; undressed when appropriate .    It has been determined that no acute conditions requiring further emergency intervention are present at this time. The patient/guardian have been advised of the diagnosis and plan. I reviewed any labs and imaging including any potential incidental findings. We have discussed signs and symptoms that warrant return to the ED and they are listed in the discharge instructions.    Vital signs are  stable at discharge.   BP 119/73 mmHg  Pulse 60  Temp(Src) 98.1 F (36.7 C)  Resp 18  Ht 5\' 11"  (1.803 m)  Wt 215 lb (97.523 kg)  BMI 30.00 kg/m2  SpO2 97%  I personally performed the services described in this documentation, which was scribed in my presence. The recorded information has been reviewed and is accurate.   Jarrett Soho Allecia Bells, PA-C 05/23/14 [place], MD 05/24/14 [phone]

## 2014-05-23 NOTE — Discharge Instructions (Signed)
1. Medications: Prednisone, Tessalon, usual home medications 2. Treatment: rest, drink plenty of fluids,  3. Follow Up: Please followup with your primary doctor in 3 days for discussion of your diagnoses and further evaluation after today's visit; if you do not have a primary care doctor use the resource guide provided to find one; Please return to the ER for worsening symptoms, difficulty breathing, high fevers   Cough, Adult  A cough is a reflex that helps clear your throat and airways. It can help heal the body or may be a reaction to an irritated airway. A cough may only last 2 or 3 weeks (acute) or may last more than 8 weeks (chronic).  CAUSES Acute cough:  Viral or bacterial infections. Chronic cough:  Infections.  Allergies.  Asthma.  Post-nasal drip.  Smoking.  Heartburn or acid reflux.  Some medicines.  Chronic lung problems (COPD).  Cancer. SYMPTOMS   Cough.  Fever.  Chest pain.  Increased breathing rate.  High-pitched whistling sound when breathing (wheezing).  Colored mucus that you cough up (sputum). TREATMENT   A bacterial cough may be treated with antibiotic medicine.  A viral cough must run its course and will not respond to antibiotics.  Your caregiver may recommend other treatments if you have a chronic cough. HOME CARE INSTRUCTIONS   Only take over-the-counter or prescription medicines for pain, discomfort, or fever as directed by your caregiver. Use cough suppressants only as directed by your caregiver.  Use a cold steam vaporizer or humidifier in your bedroom or home to help loosen secretions.  Sleep in a semi-upright position if your cough is worse at night.  Rest as needed.  Stop smoking if you smoke. SEEK IMMEDIATE MEDICAL CARE IF:   You have pus in your sputum.  Your cough starts to worsen.  You cannot control your cough with suppressants and are losing sleep.  You begin coughing up blood.  You have difficulty  breathing.  You develop pain which is getting worse or is uncontrolled with medicine.  You have a fever. MAKE SURE YOU:   Understand these instructions.  Will watch your condition.  Will get help right away if you are not doing well or get worse. Document Released: 12/13/2010 Document Revised: 09/08/2011 Document Reviewed: 12/13/2010 [facility] Patient Information 2015 [place], [place]. This information is not intended to replace advice given to you by your health care provider. Make sure you discuss any questions you have with your health care provider.

## 2014-05-23 NOTE — ED Notes (Signed)
Pt. Stated, Adrian [name] had a cough and sore throat for a week half.  They gave me medicine and time out of work but my time is ran out and I need more time out of work.

## 2014-06-23 ENCOUNTER — Encounter (HOSPITAL_COMMUNITY): Payer: Self-pay | Admitting: Emergency Medicine

## 2014-06-23 ENCOUNTER — Emergency Department (HOSPITAL_COMMUNITY): Payer: Self-pay

## 2014-06-23 ENCOUNTER — Emergency Department (HOSPITAL_COMMUNITY)
Admission: EM | Admit: 2014-06-23 | Discharge: 2014-06-23 | Disposition: A | Discharge status: Home / Self Care | Payer: Self-pay | Attending: Emergency Medicine | Admitting: Emergency Medicine

## 2014-06-23 DIAGNOSIS — J45901 Unspecified asthma with (acute) exacerbation: Secondary | ICD-10-CM | POA: Insufficient documentation

## 2014-06-23 DIAGNOSIS — Z79899 Other long term (current) drug therapy: Secondary | ICD-10-CM | POA: Insufficient documentation

## 2014-06-23 DIAGNOSIS — Z8679 Personal history of other diseases of the circulatory system: Secondary | ICD-10-CM | POA: Insufficient documentation

## 2014-06-23 DIAGNOSIS — Z872 Personal history of diseases of the skin and subcutaneous tissue: Secondary | ICD-10-CM | POA: Insufficient documentation

## 2014-06-23 DIAGNOSIS — Z8781 Personal history of (healed) traumatic fracture: Secondary | ICD-10-CM | POA: Insufficient documentation

## 2014-06-23 DIAGNOSIS — Z91040 Latex allergy status: Secondary | ICD-10-CM | POA: Insufficient documentation

## 2014-06-23 DIAGNOSIS — Z87891 Personal history of nicotine dependence: Secondary | ICD-10-CM | POA: Insufficient documentation

## 2014-06-23 DIAGNOSIS — R062 Wheezing: Secondary | ICD-10-CM

## 2014-06-23 DIAGNOSIS — Z791 Long term (current) use of non-steroidal anti-inflammatories (NSAID): Secondary | ICD-10-CM | POA: Insufficient documentation

## 2014-06-23 MED ORDER — PREDNISONE 20 MG PO TABS: ORAL_TABLET | ORAL | Status: DC

## 2014-06-23 MED ORDER — PREDNISONE 20 MG PO TABS
60.0000 mg | ORAL_TABLET | Freq: Once | ORAL | Status: AC
  Administered 2014-06-23: 60 mg via ORAL
  Filled 2014-06-23: qty 3

## 2014-06-23 MED ORDER — ALBUTEROL SULFATE HFA 108 (90 BASE) MCG/ACT IN AERS
2.0000 | INHALATION_SPRAY | Freq: Once | RESPIRATORY_TRACT | Status: AC
  Administered 2014-06-23: 2 via RESPIRATORY_TRACT
  Filled 2014-06-23: qty 6.7

## 2014-06-23 MED ORDER — MELOXICAM 7.5 MG PO TABS
7.5000 mg | ORAL_TABLET | Freq: Every day | ORAL | Status: DC
Start: 2014-06-23 — End: 2014-11-16

## 2014-06-23 MED ORDER — TRAMADOL HCL 50 MG PO TABS: 50.0000 mg | ORAL_TABLET | Freq: Four times a day (QID) | ORAL | Status: DC | PRN

## 2014-06-23 MED ORDER — IPRATROPIUM BROMIDE 0.02 % IN SOLN
0.5000 mg | Freq: Once | RESPIRATORY_TRACT | Status: DC
  Filled 2014-06-23: qty 2.5

## 2014-06-23 MED ORDER — ALBUTEROL SULFATE (2.5 MG/3ML) 0.083% IN NEBU
5.0000 mg | INHALATION_SOLUTION | Freq: Once | RESPIRATORY_TRACT | Status: AC
  Administered 2014-06-23: 5 mg via RESPIRATORY_TRACT
  Filled 2014-06-23: qty 6

## 2014-06-23 MED ORDER — ALBUTEROL SULFATE (2.5 MG/3ML) 0.083% IN NEBU
5.0000 mg | INHALATION_SOLUTION | Freq: Once | RESPIRATORY_TRACT | Status: AC
Start: 2014-06-23 — End: 2014-06-23
  Administered 2014-06-23: 5 mg via RESPIRATORY_TRACT
  Filled 2014-06-23: qty 6

## 2014-06-23 NOTE — Discharge Instructions (Signed)
Take prednisone as prescribed beginning tomorrow as you were given the first dose in the emergency department today. Use albuterol inhaler every 4-6 hours as needed for cough and wheezing.  Asthma, Acute Bronchospasm Acute bronchospasm caused by asthma is also referred to as an asthma attack. Bronchospasm means your air passages become narrowed. The narrowing is caused by inflammation and tightening of the muscles in the air tubes (bronchi) in your lungs. This can make it hard to breathe or cause you to wheeze and cough. CAUSES Possible triggers are:  Animal dander from the skin, hair, or feathers of animals.  Dust mites contained in house dust.  Cockroaches.  Pollen from trees or grass.  Mold.  Cigarette or tobacco smoke.  Air pollutants such as dust, household cleaners, hair sprays, aerosol sprays, paint fumes, strong chemicals, or strong odors.  Cold air or weather changes. Cold air may trigger inflammation. Winds increase molds and pollens in the air.  Strong emotions such as crying or laughing hard.  Stress.  Certain medicines such as aspirin or beta-blockers.  Sulfites in foods and drinks, such as dried fruits and wine.  Infections or inflammatory conditions, such as a flu, cold, or inflammation of the nasal membranes (rhinitis).  Gastroesophageal reflux disease (GERD). GERD is a condition where stomach acid backs up into your esophagus.  Exercise or strenuous activity. SIGNS AND SYMPTOMS   Wheezing.  Excessive coughing, particularly at night.  Chest tightness.  Shortness of breath. DIAGNOSIS  Your health care provider will ask you about your medical history and perform a physical exam. A chest X-ray or blood testing may be performed to look for other causes of your symptoms or other conditions that may have triggered your asthma attack. TREATMENT  Treatment is aimed at reducing inflammation and opening up the airways in your lungs. Most asthma attacks are  treated with inhaled medicines. These include quick relief or rescue medicines (such as bronchodilators) and controller medicines (such as inhaled corticosteroids). These medicines are sometimes given through an inhaler or a nebulizer. Systemic steroid medicine taken by mouth or given through an IV tube also can be used to reduce the inflammation when an attack is moderate or severe. Antibiotic medicines are only used if a bacterial infection is present.  HOME CARE INSTRUCTIONS   Rest.  Drink plenty of liquids. This helps the mucus to remain thin and be easily coughed up. Only use caffeine in moderation and do not use alcohol until you have recovered from your illness.  Do not smoke. Avoid being exposed to secondhand smoke.  You play a critical role in keeping yourself in good health. Avoid exposure to things that cause you to wheeze or to have breathing problems.  Keep your medicines up-to-date and available. Carefully follow your health care provider's treatment plan.  Take your medicine exactly as prescribed.  When pollen or pollution is bad, keep windows closed and use an air conditioner or go to places with air conditioning.  Asthma requires careful medical care. See your health care provider for a follow-up as advised. If you are more than [redacted] weeks pregnant and you were prescribed any new medicines, let your obstetrician know about the visit and how you are doing. Follow up with your health care provider as directed.  After you have recovered from your asthma attack, make an appointment with your outpatient doctor to talk about ways to reduce the likelihood of future attacks. If you do not have a doctor who manages your asthma, make an  appointment with a primary care doctor to discuss your asthma. SEEK IMMEDIATE MEDICAL CARE IF:   You are getting worse.  You have trouble breathing. If severe, call your local emergency services (911 in the [place]  You develop chest pain or  discomfort.  You are vomiting.  You are not able to keep fluids down.  You are coughing up yellow, green, brown, or bloody sputum.  You have a fever and your symptoms suddenly get worse.  You have trouble swallowing. MAKE SURE YOU:   Understand these instructions.  Will watch your condition.  Will get help right away if you are not doing well or get worse. Document Released: 10/01/2006 Document Revised: 06/21/2013 Document Reviewed: 12/22/2012 [facility] Patient Information 2015 [place], [place]. This information is not intended to replace advice given to you by your health care provider. Make sure you discuss any questions you have with your health care provider.

## 2014-06-23 NOTE — ED Provider Notes (Signed)
CSN: [id number]     Arrival date & time 06/23/14  1203 History   First MD Initiated Contact with Patient 06/23/14 1208     Chief Complaint  Patient presents with  . Cough  . Shortness of Breath     (Consider location/radiation/quality/duration/timing/severity/associated sxs/prior Treatment) HPI Comments:  38 year old male with a past medical history of asthma presenting with wheezing, chest tightness, productive cough with green phlegm and shortness of breath 2 days. States this feels similar to his prior asthma exacerbations. He tried using his albuterol inhaler with no relief. He states his lungs feel tight. Denies fever or chills. No nausea or vomiting.  The history is provided by the patient.    Past Medical History  Diagnosis Date  . Asthma   . Eczema   . Migraine   . Foot fracture, left   . Multiple food allergies     fish, tomatoes, peanuts, eggs, chicken and others  . Environmental allergies     grass   Past Surgical History  Procedure Laterality Date  . Inguinal hernia repair  1994   Family History  Problem Relation Age of Onset  . Asthma Father   . Clotting disorder      Aunt  . Breast cancer      Grandmother  . Prostate cancer      uncle  . Diabetes Mother   . Hypertension Mother   . Hyperlipidemia      grandparent  . Hypertension      grandparent/grandparent  . Kidney disease      aunt/uncle/grandparent  . Stroke      grandparent   History  Substance Use Topics  . Smoking status: Former Smoker    Types: Cigars    Quit date: 09/13/2012  . Smokeless tobacco: Not on file  . Alcohol Use: No    Review of Systems  10 Systems reviewed and are negative for acute change except as noted in the HPI.  Allergies  Peanut-containing drug products; Shellfish allergy; Tomato; and Latex  Home Medications   Prior to Admission medications   Medication Sig Start Date End Date Taking? Authorizing Provider  albuterol (PROVENTIL HFA;VENTOLIN HFA) 108 (90  BASE) MCG/ACT inhaler Inhale 1-2 puffs into the lungs every 6 (six) hours as needed for wheezing or shortness of breath. 04/18/14  Yes Billy Fischer, MD  Spacer/Aero-Holding Chambers (AEROCHAMBER MAX [facility]) MISC 1 each by Other route once. 01/26/12  Yes Gerda Diss, DO  triamcinolone ointment (KENALOG) 0.5 % Apply 1 application topically 2 (two) times daily. 04/26/14  Yes Elwyn Lade, PA-C  benzonatate (TESSALON) 100 MG capsule Take 2 capsules (200 mg total) by mouth 2 (two) times daily as needed for cough. Patient not taking: Reported on 06/23/2014 05/23/14   Jarrett Soho Muthersbaugh, PA-C  meloxicam (MOBIC) 7.5 MG tablet Take 1 tablet (7.5 mg total) by mouth daily. 06/23/14   Carman Ching, PA-C  predniSONE (DELTASONE) 20 MG tablet 2 tabs PO x 4 days 06/23/14   Carman Ching, PA-C  traMADol (ULTRAM) 50 MG tablet Take 1 tablet (50 mg total) by mouth every 6 (six) hours as needed. 06/23/14   Holleigh Crihfield M Cabell Lazenby, PA-C   BP 122/65 mmHg  Pulse 65  Temp(Src) 99.6 F (37.6 C) (Oral)  Resp 14  Ht 5\' 11"  (1.803 m)  Wt 205 lb (92.987 kg)  BMI 28.60 kg/m2  SpO2 98% Physical Exam  Constitutional: He is oriented to person, place, and time. He appears well-developed and well-nourished.  No distress.  HENT:  Head: Normocephalic and atraumatic.  Eyes: Conjunctivae and EOM are normal.  Neck: Normal range of motion. Neck supple.  Cardiovascular: Normal rate, regular rhythm and normal heart sounds.   Pulmonary/Chest: Effort normal.  Poor air movement. Diffuse wheezes bilateral.  Musculoskeletal: Normal range of motion. He exhibits no edema.  Neurological: He is alert and oriented to person, place, and time.  Skin: Skin is warm and dry.  Psychiatric: He has a normal mood and affect. His behavior is normal.  Nursing note and vitals reviewed.   ED Course  Procedures (including critical care time) Labs Review Labs Reviewed - No data to display  Imaging Review Dg Chest 2 View  06/23/2014    CLINICAL DATA:  Asthma, difficulty breathing, chest pain and cough beginning yesterday  EXAM: CHEST  2 VIEW  COMPARISON:  09/13/2013  FINDINGS: Normal heart size, mediastinal contours, and pulmonary vascularity.  Lungs clear.  No pleural effusion or pneumothorax.  Minimal hyperinflation.  Osseous structures unremarkable.  IMPRESSION: Minimal hyperinflation consistent with history of asthma.  No acute infiltrate.   Electronically Signed   By: Lavonia Dana M.D.   On: 06/23/2014 13:08     EKG Interpretation None      MDM   Final diagnoses:  Asthma exacerbation   Pt in NAD. VSS. O2 sat between 94 and 98% on RA.  Significant improvement of sreath pounds after receiving 2 albuterol treatments and oral prednisone.  Minimal wheezing on reexamination. Chest x-ray showing minimal hyperinflation consistent with a history of asthma, no acute infiltrates. Will discharge patient home on steroid burst and a refill of his albuterol inhaler. Stable for discharge. Return precautions given. Patient states understanding of treatment care plan and is agreeable.   Carman Ching, PA-C 06/23/14 1419  Pamella Pert, MD 06/24/14 [phone]

## 2014-06-23 NOTE — ED Notes (Signed)
Pt from home for eval of productive cough and sob x2 days, pt states hx of asthma and reports that his inhaler is not helping anymore. Pt denies any smoking, denies n/v/d or fevers at home. Pt able to talk in complete sentences with no distress but reports chest tightness due to his cough. Wheezing noted upon triage. NAD, AXO X4

## 2014-06-23 NOTE — ED Notes (Signed)
Pt placed into gown and on monitor upon arrival to room. Pt monitored by blood pressure, pulse ox, and 5 lead. pts family remains at bedside.

## 2014-06-27 ENCOUNTER — Emergency Department (HOSPITAL_COMMUNITY)
Admission: EM | Admit: 2014-06-27 | Discharge: 2014-06-27 | Disposition: A | Discharge status: Home / Self Care | Payer: Self-pay | Attending: Emergency Medicine | Admitting: Emergency Medicine

## 2014-06-27 DIAGNOSIS — Z87891 Personal history of nicotine dependence: Secondary | ICD-10-CM | POA: Insufficient documentation

## 2014-06-27 DIAGNOSIS — R001 Bradycardia, unspecified: Secondary | ICD-10-CM | POA: Insufficient documentation

## 2014-06-27 DIAGNOSIS — J45901 Unspecified asthma with (acute) exacerbation: Secondary | ICD-10-CM | POA: Insufficient documentation

## 2014-06-27 DIAGNOSIS — Z79899 Other long term (current) drug therapy: Secondary | ICD-10-CM | POA: Insufficient documentation

## 2014-06-27 DIAGNOSIS — Z8679 Personal history of other diseases of the circulatory system: Secondary | ICD-10-CM | POA: Insufficient documentation

## 2014-06-27 DIAGNOSIS — Z872 Personal history of diseases of the skin and subcutaneous tissue: Secondary | ICD-10-CM | POA: Insufficient documentation

## 2014-06-27 DIAGNOSIS — J069 Acute upper respiratory infection, unspecified: Secondary | ICD-10-CM | POA: Insufficient documentation

## 2014-06-27 DIAGNOSIS — Z91040 Latex allergy status: Secondary | ICD-10-CM | POA: Insufficient documentation

## 2014-06-27 MED ORDER — ALBUTEROL SULFATE (2.5 MG/3ML) 0.083% IN NEBU
5.0000 mg | INHALATION_SOLUTION | Freq: Once | RESPIRATORY_TRACT | Status: AC
Start: 2014-06-27 — End: 2014-06-27
  Administered 2014-06-27: 5 mg via RESPIRATORY_TRACT
  Filled 2014-06-27: qty 6

## 2014-06-27 MED ORDER — ALBUTEROL SULFATE (2.5 MG/3ML) 0.083% IN NEBU
5.0000 mg | INHALATION_SOLUTION | Freq: Once | RESPIRATORY_TRACT | Status: AC
  Administered 2014-06-27: 5 mg via RESPIRATORY_TRACT
  Filled 2014-06-27: qty 6

## 2014-06-27 MED ORDER — DEXAMETHASONE SODIUM PHOSPHATE 10 MG/ML IJ SOLN
10.0000 mg | Freq: Once | INTRAMUSCULAR | Status: AC
  Administered 2014-06-27: 10 mg via INTRAMUSCULAR
  Filled 2014-06-27: qty 1

## 2014-06-27 MED ORDER — IPRATROPIUM BROMIDE 0.02 % IN SOLN: 0.5000 mg | Freq: Once | RESPIRATORY_TRACT | Status: DC

## 2014-06-27 NOTE — ED Provider Notes (Signed)
CSN: [id number]     Arrival date & time 06/27/14  0544 History   First MD Initiated Contact with Patient 06/27/14 0701     Chief Complaint  Patient presents with  . Asthma     (Consider location/radiation/quality/duration/timing/severity/associated sxs/prior Treatment) HPI Comments: Patient with history of asthma presents with complaint of shortness of breath and wheezing beginning 6 days ago. Patient was seen in emergency department and treated 4 days ago. He was given a course of prednisone which she has not filled due to financial reasons. He has been using his albuterol inhaler approximately every 2 hours. Wheezing was worse this morning so he presents to the emergency department for treatment. No fever. Patient has a runny nose but no other URI symptoms. He has an occasional nonproductive cough. Patient notes that his chest is tight with wheezing. No nausea, vomiting, diarrhea or abdominal pain. The onset of this condition was acute. The course is constant. Aggravating factors: activity, cold air. Alleviating factors: inhaler.    Patient is a 38 y.o. male presenting with asthma. The history is provided by the patient and medical records.  Asthma Associated symptoms include coughing. Pertinent negatives include no abdominal pain, chest pain, fever, headaches, myalgias, nausea, rash, sore throat or vomiting.    Past Medical History  Diagnosis Date  . Asthma   . Eczema   . Migraine   . Foot fracture, left   . Multiple food allergies     fish, tomatoes, peanuts, eggs, chicken and others  . Environmental allergies     grass   Past Surgical History  Procedure Laterality Date  . Inguinal hernia repair  1994   Family History  Problem Relation Age of Onset  . Asthma Father   . Clotting disorder      Aunt  . Breast cancer      Grandmother  . Prostate cancer      uncle  . Diabetes Mother   . Hypertension Mother   . Hyperlipidemia      grandparent  . Hypertension     grandparent/grandparent  . Kidney disease      aunt/uncle/grandparent  . Stroke      grandparent   History  Substance Use Topics  . Smoking status: Former Smoker    Types: Cigars    Quit date: 09/13/2012  . Smokeless tobacco: Not on file  . Alcohol Use: No    Review of Systems  Constitutional: Negative for fever.  HENT: Positive for rhinorrhea. Negative for sore throat.   Eyes: Negative for redness.  Respiratory: Positive for cough, shortness of breath and wheezing.   Cardiovascular: Negative for chest pain.  Gastrointestinal: Negative for nausea, vomiting, abdominal pain and diarrhea.  Genitourinary: Negative for dysuria.  Musculoskeletal: Negative for myalgias.  Skin: Negative for rash.  Neurological: Negative for headaches.    Allergies  Peanut-containing drug products; Shellfish allergy; Tomato; and Latex  Home Medications   Prior to Admission medications   Medication Sig Start Date End Date Taking? Authorizing Provider  albuterol (PROVENTIL HFA;VENTOLIN HFA) 108 (90 BASE) MCG/ACT inhaler Inhale 1-2 puffs into the lungs every 6 (six) hours as needed for wheezing or shortness of breath. 04/18/14  Yes Billy Fischer, MD  meloxicam (MOBIC) 7.5 MG tablet Take 1 tablet (7.5 mg total) by mouth daily. Patient taking differently: Take 7.5 mg by mouth daily as needed for pain.  06/23/14  Yes Robyn M Hess, PA-C  Multiple Vitamin (ONE-A-DAY MENS PO) Take 1 tablet by mouth daily.  Yes Historical Provider, MD  predniSONE (DELTASONE) 20 MG tablet 2 tabs PO x 4 days 06/23/14  Yes Carman Ching, PA-C  Spacer/Aero-Holding Chambers (AEROCHAMBER MAX [facility]) MISC 1 each by Other route once. 01/26/12  Yes Gerda Diss, DO  traMADol (ULTRAM) 50 MG tablet Take 1 tablet (50 mg total) by mouth every 6 (six) hours as needed. Patient taking differently: Take 50 mg by mouth every 6 (six) hours as needed for moderate pain.  06/23/14  Yes Robyn M Hess, PA-C  triamcinolone ointment (KENALOG)  0.5 % Apply 1 application topically 2 (two) times daily. 04/26/14  Yes Elwyn Lade, PA-C  benzonatate (TESSALON) 100 MG capsule Take 2 capsules (200 mg total) by mouth 2 (two) times daily as needed for cough. Patient not taking: Reported on 06/23/2014 05/23/14   Jarrett Soho Muthersbaugh, PA-C   BP 124/84 mmHg  Pulse 49  Temp(Src) 97.5 F (36.4 C) (Oral)  Resp 20  Ht 5\' 11"  (1.803 m)  Wt 205 lb (92.987 kg)  BMI 28.60 kg/m2  SpO2 99%   Physical Exam  Constitutional: He appears well-developed and well-nourished.  HENT:  Head: Normocephalic and atraumatic.  Nose: Nose normal.  Mouth/Throat: Oropharynx is clear and moist.  Eyes: Conjunctivae are normal. Right eye exhibits no discharge. Left eye exhibits no discharge.  Neck: Normal range of motion. Neck supple.  Cardiovascular: Regular rhythm and normal heart sounds.  Bradycardia present.   No murmur heard. Mild bradycardia in 50's  Pulmonary/Chest: Effort normal. No respiratory distress. He has wheezes (mild, end-expiratory, at bases). He has no rales.  Abdominal: Soft. There is no tenderness.  Neurological: He is alert.  Skin: Skin is warm and dry.  Psychiatric: He has a normal mood and affect.  Nursing note and vitals reviewed.   ED Course  Procedures (including critical care time) Labs Review Labs Reviewed - No data to display  Imaging Review No results found.   EKG Interpretation None       7:02 AM Patient seen and examined.   Vital signs reviewed and are as follows: BP 124/84 mmHg  Pulse 49  Temp(Src) 97.5 F (36.4 C) (Oral)  Resp 20  Ht 5\' 11"  (1.803 m)  Wt 205 lb (92.987 kg)  BMI 28.60 kg/m2  SpO2 99%  Will give additional treatment. As patient unable to get prednisone, he agrees to get a shot of decadron to help with his symptoms. Will reassess.   7:53 AM complete resolution of wheezing after second breathing treatment. Decadron given. Will discharge to home. Patient given health and wellness referral  and encouraged follow-up.  Patient told to return with worsening shortness of breath, wheezing, fever, or other concerns. He verbalizes understanding and agrees with the plan.  MDM   Final diagnoses:  Asthma flare  URI (upper respiratory infection)   Patient with asthma flare likely triggered by URI. Do not suspect pneumonia based on exam, no fever, well-appearing. Patient with improvement in emergency department after 2 nebulizer treatments. Patient did not fill the prednisone prescribed 4 days ago. He was given IM Decadron today.  Mild bradycardia likely patient's baseline. Asymptomatic.    Carlisle Cater, PA-C 06/27/14 2947  Pamella Pert, MD 06/27/14 1037

## 2014-06-27 NOTE — ED Notes (Signed)
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 Bilateral expiratory wheezes.

## 2014-06-27 NOTE — Discharge Instructions (Signed)
Please read and follow all provided instructions.  Your diagnoses today include:  1. Asthma flare   2. URI (upper respiratory infection)    Tests performed today include:  Vital signs. See below for your results today.   Medications prescribed:   None Take any prescribed medications only as directed.  Home care instructions:  Follow any educational materials contained in this packet.  Follow-up instructions: Please follow-up with your primary care provider in the next 7 days for further evaluation of your symptoms and management of your asthma.  Return instructions:   Please return to the Emergency Department if you experience worsening symptoms.  Please return with worsening wheezing, shortness of breath, or difficulty breathing.  Return with persistent fever above 101F.   Please return if you have any other emergent concerns.  Additional Information:  Your vital signs today were: BP 109/43 mmHg   Pulse 46   Temp(Src) 97.5 F (36.4 C) (Oral)   Resp 20   Ht 5\' 11"  (1.803 m)   Wt 205 lb (92.987 kg)   BMI 28.60 kg/m2   SpO2 99% If your blood pressure (BP) was elevated above 135/85 this visit, please have this repeated by your doctor within one month. --------------

## 2014-06-28 ENCOUNTER — Encounter (HOSPITAL_COMMUNITY): Payer: Self-pay | Admitting: Nurse Practitioner

## 2014-06-28 ENCOUNTER — Emergency Department (HOSPITAL_COMMUNITY)
Admission: EM | Admit: 2014-06-28 | Discharge: 2014-06-28 | Disposition: A | Discharge status: Home / Self Care | Payer: Self-pay | Attending: Emergency Medicine | Admitting: Emergency Medicine

## 2014-06-28 DIAGNOSIS — Z8679 Personal history of other diseases of the circulatory system: Secondary | ICD-10-CM | POA: Insufficient documentation

## 2014-06-28 DIAGNOSIS — Z872 Personal history of diseases of the skin and subcutaneous tissue: Secondary | ICD-10-CM | POA: Insufficient documentation

## 2014-06-28 DIAGNOSIS — Z79899 Other long term (current) drug therapy: Secondary | ICD-10-CM | POA: Insufficient documentation

## 2014-06-28 DIAGNOSIS — Z0000 Encounter for general adult medical examination without abnormal findings: Secondary | ICD-10-CM | POA: Insufficient documentation

## 2014-06-28 DIAGNOSIS — J45901 Unspecified asthma with (acute) exacerbation: Secondary | ICD-10-CM | POA: Insufficient documentation

## 2014-06-28 DIAGNOSIS — Z8781 Personal history of (healed) traumatic fracture: Secondary | ICD-10-CM | POA: Insufficient documentation

## 2014-06-28 DIAGNOSIS — Z87891 Personal history of nicotine dependence: Secondary | ICD-10-CM | POA: Insufficient documentation

## 2014-06-28 DIAGNOSIS — Z7952 Long term (current) use of systemic steroids: Secondary | ICD-10-CM | POA: Insufficient documentation

## 2014-06-28 NOTE — Discharge Instructions (Signed)

## 2014-06-28 NOTE — ED Provider Notes (Signed)
CSN: [id number]     Arrival date & time 06/28/14  1210 History  This chart was scribed for Domenic Moras, PA-C, working with Mirna Mires, MD by Steva Colder, ED Scribe. The patient was seen in room TR06C/TR06C at 12:49 PM.    Chief Complaint  Patient presents with  . Letter for School/Work    The history is provided by the patient. No language interpreter was used.    HPI Comments: Adrian [name] is a 38 y.o. male who presents to the Emergency Department complaining of letter for work. He was seen on 12/25 and 12/29 both for asthma related issues. He reports that he was given a work note and his job gave me a medical clearance paperwork to be filled out here. His job is now saying that they will not let him come back to work until he has medical authorization to do so. He reports that he does have an inhaler if he needs it. He also states that the line of work he does, does not affect his work habit. He denies any other symptoms. 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    Past Medical History  Diagnosis Date  . Asthma   . Eczema   . Migraine   . Foot fracture, left   . Multiple food allergies     fish, tomatoes, peanuts, eggs, chicken and others  . Environmental allergies     grass   Past Surgical History  Procedure Laterality Date  . Inguinal hernia repair  1994   Family History  Problem Relation Age of Onset  . Asthma Father   . Clotting disorder      Aunt  . Breast cancer      Grandmother  . Prostate cancer      uncle  . Diabetes Mother   . Hypertension Mother   . Hyperlipidemia      grandparent  . Hypertension      grandparent/grandparent  . Kidney disease      aunt/uncle/grandparent  . Stroke      grandparent   History  Substance Use Topics  . Smoking status: Former Smoker    Types: Cigars    Quit date: 09/13/2012  . Smokeless tobacco: Not on file  . Alcohol Use: No    Review of Systems   Constitutional: Negative for fever and chills.  Respiratory: Negative for shortness of breath and wheezing.       Allergies  Peanut-containing drug products; Shellfish allergy; Tomato; and Latex  Home Medications   Prior to Admission medications   Medication Sig Start Date End Date Taking? Authorizing Provider  albuterol (PROVENTIL HFA;VENTOLIN HFA) 108 (90 BASE) MCG/ACT inhaler Inhale 1-2 puffs into the lungs every 6 (six) hours as needed for wheezing or shortness of breath. 04/18/14   Billy Fischer, MD  benzonatate (TESSALON) 100 MG capsule Take 2 capsules (200 mg total) by mouth 2 (two) times daily as needed for cough. Patient not taking: Reported on 06/23/2014 05/23/14   Jarrett Soho Muthersbaugh, PA-C  meloxicam (MOBIC) 7.5 MG tablet Take 1 tablet (7.5 mg total) by mouth daily. Patient taking differently: Take 7.5 mg by mouth daily as needed for pain.  06/23/14   Carman Ching, PA-C  Multiple Vitamin (ONE-A-DAY MENS PO) Take 1 tablet by mouth daily.    Historical Provider, MD  predniSONE (DELTASONE) 20 MG tablet 2 tabs PO x 4 days 06/23/14   Carman Ching,  PA-C  Spacer/Aero-Holding Chambers (AEROCHAMBER MAX [facility]) MISC 1 each by Other route once. 01/26/12   Gerda Diss, DO  traMADol (ULTRAM) 50 MG tablet Take 1 tablet (50 mg total) by mouth every 6 (six) hours as needed. Patient taking differently: Take 50 mg by mouth every 6 (six) hours as needed for moderate pain.  06/23/14   Carman Ching, PA-C  triamcinolone ointment (KENALOG) 0.5 % Apply 1 application topically 2 (two) times daily. 04/26/14   Kara Mead Merrell, PA-C   BP 124/68 mmHg  Pulse 55  Temp(Src) 98.1 F (36.7 C) (Oral)  Resp 18  SpO2 98%  Physical Exam  Constitutional: He is oriented to person, place, and time. He appears well-developed and well-nourished. No distress.  HENT:  Head: Normocephalic and atraumatic.  Eyes: EOM are normal.  Neck: Neck supple. No tracheal deviation present.  Cardiovascular: Normal  rate, regular rhythm and normal heart sounds.  Exam reveals no gallop and no friction rub.   No murmur heard. Pulmonary/Chest: Effort normal. No respiratory distress. He has wheezes. He has no rhonchi. He has no rales.  Faint expiratory wheezes, no rales or rhonchi  Musculoskeletal: Normal range of motion.  Neurological: He is alert and oriented to person, place, and time.  Skin: Skin is warm and dry.  Psychiatric: He has a normal mood and affect. His behavior is normal.  Nursing note and vitals reviewed.   ED Course  Procedures (including critical care time) DIAGNOSTIC STUDIES: Oxygen Saturation is 98% on room air, normal by my interpretation.    COORDINATION OF CARE: 12:52 PM-Discussed treatment plan which includes medical clearance paperwork filled out and letter for authorization to return to work given to pt with pt at bedside and pt agreed to plan.   Labs Review Labs Reviewed - No data to display  Imaging Review No results found.   EKG Interpretation None      MDM   Final diagnoses:  Health care maintenance    BP 124/68 mmHg  Pulse 55  Temp(Src) 98.1 F (36.7 C) (Oral)  Resp 18  SpO2 98%   I personally performed the services described in this documentation, which was scribed in my presence. The recorded information has been reviewed and is accurate.    Domenic Moras, PA-C 06/29/14 [place], MD 06/29/14 [phone]

## 2014-06-28 NOTE — ED Notes (Signed)
He reports he was here twice over past weeks for asthma attacks. His job will not allow him to return to work until he has authorization to return from a medical doctor. He denies any complaints

## 2014-08-12 ENCOUNTER — Emergency Department (HOSPITAL_COMMUNITY): Payer: Self-pay

## 2014-08-12 ENCOUNTER — Encounter (HOSPITAL_COMMUNITY): Payer: Self-pay | Admitting: Emergency Medicine

## 2014-08-12 ENCOUNTER — Emergency Department (INDEPENDENT_AMBULATORY_CARE_PROVIDER_SITE_OTHER)
Admission: EM | Admit: 2014-08-12 | Discharge: 2014-08-12 | Disposition: A | Discharge status: Nursing Home (Includes ICF) | Payer: Self-pay | Source: Home / Self Care | Attending: Family Medicine | Admitting: Family Medicine

## 2014-08-12 ENCOUNTER — Emergency Department (HOSPITAL_COMMUNITY)
Admission: EM | Admit: 2014-08-12 | Discharge: 2014-08-12 | Disposition: A | Discharge status: Home / Self Care | Payer: Self-pay | Attending: Emergency Medicine | Admitting: Emergency Medicine

## 2014-08-12 DIAGNOSIS — W182XXA Fall in (into) shower or empty bathtub, initial encounter: Secondary | ICD-10-CM | POA: Insufficient documentation

## 2014-08-12 DIAGNOSIS — Y998 Other external cause status: Secondary | ICD-10-CM | POA: Insufficient documentation

## 2014-08-12 DIAGNOSIS — R011 Cardiac murmur, unspecified: Secondary | ICD-10-CM | POA: Insufficient documentation

## 2014-08-12 DIAGNOSIS — Y9389 Activity, other specified: Secondary | ICD-10-CM | POA: Insufficient documentation

## 2014-08-12 DIAGNOSIS — Z7952 Long term (current) use of systemic steroids: Secondary | ICD-10-CM | POA: Insufficient documentation

## 2014-08-12 DIAGNOSIS — Z8679 Personal history of other diseases of the circulatory system: Secondary | ICD-10-CM | POA: Insufficient documentation

## 2014-08-12 DIAGNOSIS — Z8781 Personal history of (healed) traumatic fracture: Secondary | ICD-10-CM | POA: Insufficient documentation

## 2014-08-12 DIAGNOSIS — Z79899 Other long term (current) drug therapy: Secondary | ICD-10-CM | POA: Insufficient documentation

## 2014-08-12 DIAGNOSIS — Z791 Long term (current) use of non-steroidal anti-inflammatories (NSAID): Secondary | ICD-10-CM | POA: Insufficient documentation

## 2014-08-12 DIAGNOSIS — R001 Bradycardia, unspecified: Secondary | ICD-10-CM

## 2014-08-12 DIAGNOSIS — Z872 Personal history of diseases of the skin and subcutaneous tissue: Secondary | ICD-10-CM | POA: Insufficient documentation

## 2014-08-12 DIAGNOSIS — Y92091 Bathroom in other non-institutional residence as the place of occurrence of the external cause: Secondary | ICD-10-CM | POA: Insufficient documentation

## 2014-08-12 DIAGNOSIS — S0990XA Unspecified injury of head, initial encounter: Secondary | ICD-10-CM | POA: Insufficient documentation

## 2014-08-12 DIAGNOSIS — R55 Syncope and collapse: Secondary | ICD-10-CM

## 2014-08-12 DIAGNOSIS — J45909 Unspecified asthma, uncomplicated: Secondary | ICD-10-CM | POA: Insufficient documentation

## 2014-08-12 DIAGNOSIS — Z91040 Latex allergy status: Secondary | ICD-10-CM | POA: Insufficient documentation

## 2014-08-12 DIAGNOSIS — Z87891 Personal history of nicotine dependence: Secondary | ICD-10-CM | POA: Insufficient documentation

## 2014-08-12 HISTORY — DX: Cardiac murmur, unspecified: R01.1

## 2014-08-12 LAB — CBC WITH DIFFERENTIAL/PLATELET
BASOS ABS: 0 10*3/uL (ref 0.0–0.1)
Basophils Relative: 0 % (ref 0–1)
EOS PCT: 1 % (ref 0–5)
Eosinophils Absolute: 0.1 10*3/uL (ref 0.0–0.7)
HEMATOCRIT: 41.7 % (ref 39.0–52.0)
Hemoglobin: 13.5 g/dL (ref 13.0–17.0)
LYMPHS ABS: 1.8 10*3/uL (ref 0.7–4.0)
LYMPHS PCT: 30 % (ref 12–46)
MCH: 26.6 pg (ref 26.0–34.0)
MCHC: 32.4 g/dL (ref 30.0–36.0)
MCV: 82.1 fL (ref 78.0–100.0)
MONO ABS: 0.4 10*3/uL (ref 0.1–1.0)
Monocytes Relative: 7 % (ref 3–12)
Neutro Abs: 3.7 10*3/uL (ref 1.7–7.7)
Neutrophils Relative %: 62 % (ref 43–77)
Platelets: 229 10*3/uL (ref 150–400)
RBC: 5.08 MIL/uL (ref 4.22–5.81)
RDW: 14.2 % (ref 11.5–15.5)
WBC: 6 10*3/uL (ref 4.0–10.5)

## 2014-08-12 LAB — BASIC METABOLIC PANEL
ANION GAP: 10 (ref 5–15)
BUN: 8 mg/dL (ref 6–23)
CO2: 24 mmol/L (ref 19–32)
Calcium: 9.1 mg/dL (ref 8.4–10.5)
Chloride: 103 mmol/L (ref 96–112)
Creatinine, Ser: 1.05 mg/dL (ref 0.50–1.35)
GFR calc non Af Amer: 88 mL/min — ABNORMAL LOW (ref >90)
Glucose, Bld: 91 mg/dL (ref 70–99)
Potassium: 3.9 mmol/L (ref 3.5–5.1)
Sodium: 137 mmol/L (ref 135–145)

## 2014-08-12 NOTE — ED Notes (Signed)
C/o  Feeling extremely dizzy.  States "feels like I am going to pass out".   Hands and arms go numb at night.  Unsteady gait.  Symptoms present x 2 days and gradually getting worse.

## 2014-08-12 NOTE — ED Notes (Signed)
Pt arrived by [facility] from Urgent care. Pt c/o dizziness, near syncope and tingling numbness in bilateral hands. Pt HR normally lower has been in the upper 30s to lower 40s. Stated that he hit his head 4 days ago while sitting on the edge of bathtub. Yesterday while pt was at mall walking around pt became dizzy and felt like he was going to pass out. 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

## 2014-08-12 NOTE — Discharge Instructions (Signed)
Near-Syncope Near-syncope (commonly known as near fainting) is sudden weakness, dizziness, or feeling like you might pass out. This can happen when getting up or while standing for a long time. It is caused by a sudden decrease in blood flow to the brain, which can occur for various reasons. Most of the reasons are not serious.  HOME CARE Watch your condition for any changes.  Have someone stay with you until you feel stable.  If you feel like you are going to pass out:  Lie down right away.  Prop your feet up if you can.  Breathe deeply and steadily.  Move only when the feeling has gone away. Most of the time, this feeling lasts only a few minutes. You may feel tired for several hours.  Drink enough fluids to keep your pee (urine) clear or pale yellow.  If you are taking blood pressure or heart medicine, stand up slowly.  Follow up with your doctor as told. GET HELP RIGHT AWAY IF:   You have a severe headache.  You have unusual pain in the chest, belly (abdomen), or back.  You have bleeding from the mouth or butt (rectum), or you have black or tarry poop (stool).  You feel your heart beat differently than normal, or you have a very fast pulse.  You pass out, or you twitch and shake when you pass out.  You pass out when sitting or lying down.  You feel confused.  You have trouble walking.  You are weak.  You have vision problems. MAKE SURE YOU:   Understand these instructions.  Will watch your condition.  Will get help right away if you are not doing well or get worse. Document Released: 12/03/2007 Document Revised: 06/21/2013 Document Reviewed: 11/19/2012 [facility] Patient Information 2015 [place], [place]. This information is not intended to replace advice given to you by your health care provider. Make sure you discuss any questions you have with your health care provider.

## 2014-08-12 NOTE — ED Provider Notes (Signed)
Adrian [name] is a 39 y.o. male who presents to Urgent Care today for dizzy and lightheadedness and presyncopal feelings present for the last few days. Patient denies any chest pain or palpitations. He feels "drunk". He denies any headache or vision changes. He also notes bilateral hand tingling and numbness.   Past Medical History  Diagnosis Date  . Asthma   . Eczema   . Migraine   . Foot fracture, left   . Multiple food allergies     fish, tomatoes, peanuts, eggs, chicken and others  . Environmental allergies     grass   Past Surgical History  Procedure Laterality Date  . Inguinal hernia repair  1994   History  Substance Use Topics  . Smoking status: Former Smoker    Types: Cigars    Quit date: 09/13/2012  . Smokeless tobacco: Not on file  . Alcohol Use: No   ROS as above Medications: No current facility-administered medications for this encounter.   Current Outpatient Prescriptions  Medication Sig Dispense Refill  . albuterol (PROVENTIL HFA;VENTOLIN HFA) 108 (90 BASE) MCG/ACT inhaler Inhale 1-2 puffs into the lungs every 6 (six) hours as needed for wheezing or shortness of breath. 1 Inhaler 0  . benzonatate (TESSALON) 100 MG capsule Take 2 capsules (200 mg total) by mouth 2 (two) times daily as needed for cough. (Patient not taking: Reported on 06/23/2014) 20 capsule 0  . meloxicam (MOBIC) 7.5 MG tablet Take 1 tablet (7.5 mg total) by mouth daily. (Patient taking differently: Take 7.5 mg by mouth daily as needed for pain. ) 60 tablet 0  . Multiple Vitamin (ONE-A-DAY MENS PO) Take 1 tablet by mouth daily.    . predniSONE (DELTASONE) 20 MG tablet 2 tabs PO x 4 days 8 tablet 0  . Spacer/Aero-Holding Chambers (AEROCHAMBER MAX [facility]) MISC 1 each by Other route once. 1 each 3  . traMADol (ULTRAM) 50 MG tablet Take 1 tablet (50 mg total) by mouth every 6 (six) hours as needed. (Patient taking differently: Take 50 mg by mouth every 6 (six) hours as needed for moderate pain. )  15 tablet 0  . triamcinolone ointment (KENALOG) 0.5 % Apply 1 application topically 2 (two) times daily. 30 g 0   Allergies  Allergen Reactions  . Peanut-Containing Drug Products Anaphylaxis, Hives, Nausea And Vomiting and Swelling  . Shellfish Allergy Anaphylaxis, Hives, Swelling and Other (See Comments)    "all seafood'  . Tomato Anaphylaxis, Hives and Swelling  . Latex Itching     Exam:  BP 117/62 mmHg  Pulse 45  Temp(Src) 97.5 F (36.4 C) (Oral)  Resp 16  SpO2 100% Gen: Well NAD HEENT: EOMI,  MMM Lungs: Normal work of breathing. CTABL Heart: Bradycardia no MRG Abd: NABS, Soft. Nondistended, Nontender Exts: Brisk capillary refill, warm and well perfused.  Neuro: Alert and oriented able to sit and converse normally.  12 lead ekg shows sinus bradycardia at a rate of 40 beats per minute. No ST elevation or depression. J point elevation present. PR interval is 182. No Q waves.   No results found for this or any previous visit (from the past 24 hour(s)). No results found.  Assessment and Plan: 39 y.o. male with dizziness with significant sinus bradycardia. Patient is currently stable and mentating normally.  Will start an IV and transfer to the emergency department via EMS. Will have atropine and pacing available but not use unless patient becomes unstable.   Discussed warning signs or symptoms. Please see discharge  instructions. Patient expresses understanding.     Gregor Hams, MD 08/12/14 [phone]

## 2014-08-13 NOTE — ED Provider Notes (Signed)
CSN: [id number]     Arrival date & time 08/12/14  1402 History   First MD Initiated Contact with Patient 08/12/14 1413     Chief Complaint  Patient presents with  . Near Syncope     (Consider location/radiation/quality/duration/timing/severity/associated sxs/prior Treatment) Patient is a 39 y.o. male presenting with near-syncope. The history is provided by the patient.  Near Syncope Pertinent negatives include no chest pain, no abdominal pain, no headaches and no shortness of breath.   patient sent from urgent care for concerns for bradycardia. EKG shows sinus bradycardia rate in the 40s. Patient currently asymptomatic. Patient struck the back of his head on the top on Tuesday. No loss of consciousness. But since that time patient has been having difficulties with lightheadedness some dizziness and feeling like he's got a pass out. No nausea no vomiting no visual changes. No significant headache. Has had some bilateral hand tingling and numbness that comes and goes.  Past Medical History  Diagnosis Date  . Asthma   . Eczema   . Migraine   . Foot fracture, left   . Multiple food allergies     fish, tomatoes, peanuts, eggs, chicken and others  . Environmental allergies     grass  . Heart murmur    Past Surgical History  Procedure Laterality Date  . Inguinal hernia repair  1994   Family History  Problem Relation Age of Onset  . Asthma Father   . Clotting disorder      Aunt  . Breast cancer      Grandmother  . Prostate cancer      uncle  . Diabetes Mother   . Hypertension Mother   . Hyperlipidemia      grandparent  . Hypertension      grandparent/grandparent  . Kidney disease      aunt/uncle/grandparent  . Stroke      grandparent   History  Substance Use Topics  . Smoking status: Former Smoker    Types: Cigars    Quit date: 09/13/2012  . Smokeless tobacco: Not on file  . Alcohol Use: No    Review of Systems  Constitutional: Negative for fever and fatigue.   HENT: Negative for congestion.   Eyes: Negative for visual disturbance.  Respiratory: Negative for shortness of breath.   Cardiovascular: Positive for near-syncope. Negative for chest pain.  Gastrointestinal: Negative for nausea, vomiting and abdominal pain.  Genitourinary: Negative for dysuria and hematuria.  Musculoskeletal: Negative for back pain and neck pain.  Skin: Negative for rash.  Neurological: Positive for dizziness, light-headedness and numbness. Negative for seizures, syncope, speech difficulty, weakness and headaches.  Hematological: Does not bruise/bleed easily.  Psychiatric/Behavioral: Negative for confusion.      Allergies  Peanut-containing drug products; Shellfish allergy; Tomato; and Latex  Home Medications   Prior to Admission medications   Medication Sig Start Date End Date Taking? Authorizing Provider  albuterol (PROVENTIL HFA;VENTOLIN HFA) 108 (90 BASE) MCG/ACT inhaler Inhale 1-2 puffs into the lungs every 6 (six) hours as needed for wheezing or shortness of breath. 04/18/14  Yes Billy Fischer, MD  meloxicam (MOBIC) 7.5 MG tablet Take 1 tablet (7.5 mg total) by mouth daily. Patient taking differently: Take 7.5 mg by mouth daily as needed for pain.  06/23/14  Yes Robyn M Hess, PA-C  traMADol (ULTRAM) 50 MG tablet Take 1 tablet (50 mg total) by mouth every 6 (six) hours as needed. Patient taking differently: Take 50 mg by mouth every 6 (six) hours  as needed for moderate pain.  06/23/14  Yes Robyn M Hess, PA-C  triamcinolone ointment (KENALOG) 0.5 % Apply 1 application topically 2 (two) times daily. 04/26/14  Yes Elwyn Lade, PA-C  benzonatate (TESSALON) 100 MG capsule Take 2 capsules (200 mg total) by mouth 2 (two) times daily as needed for cough. Patient not taking: Reported on 06/23/2014 05/23/14   Jarrett Soho Muthersbaugh, PA-C  predniSONE (DELTASONE) 20 MG tablet 2 tabs PO x 4 days Patient not taking: Reported on 08/12/2014 06/23/14   Carman Ching, PA-C   Spacer/Aero-Holding Chambers (AEROCHAMBER MAX [facility]) MISC 1 each by Other route once. 01/26/12   Gerda Diss, DO   BP 118/78 mmHg  Pulse 44  Temp(Src) 97.6 F (36.4 C) (Oral)  Resp 16  SpO2 100% Physical Exam  Constitutional: He is oriented to person, place, and time. He appears well-developed and well-nourished. No distress.  HENT:  Head: Normocephalic and atraumatic.  Mouth/Throat: Oropharynx is clear and moist.  Eyes: Conjunctivae and EOM are normal. Pupils are equal, round, and reactive to light.  Neck: Normal range of motion.  Cardiovascular: Regular rhythm and normal heart sounds.   Bradycardic  Pulmonary/Chest: Effort normal and breath sounds normal.  Abdominal: Bowel sounds are normal. There is no tenderness.  Musculoskeletal: Normal range of motion. He exhibits no edema.  Neurological: He is alert and oriented to person, place, and time. No cranial nerve deficit. He exhibits normal muscle tone. Coordination normal.  Skin: Skin is warm. No rash noted.  Nursing note and vitals reviewed.   ED Course  Procedures (including critical care time) Labs Review Labs Reviewed  BASIC METABOLIC PANEL - Abnormal; Notable for the following:    GFR calc non Af Amer 88 (*)    All other components within normal limits  CBC WITH DIFFERENTIAL/PLATELET   Results for orders placed or performed during the hospital encounter of 08/12/14  CBC with Differential/Platelet  Result Value Ref Range   WBC 6.0 4.0 - 10.5 K/uL   RBC 5.08 4.22 - 5.81 MIL/uL   Hemoglobin 13.5 13.0 - 17.0 g/dL   HCT 41.7 39.0 - 52.0 %   MCV 82.1 78.0 - 100.0 fL   MCH 26.6 26.0 - 34.0 pg   MCHC 32.4 30.0 - 36.0 g/dL   RDW 14.2 11.5 - 15.5 %   Platelets 229 150 - 400 K/uL   Neutrophils Relative % 62 43 - 77 %   Neutro Abs 3.7 1.7 - 7.7 K/uL   Lymphocytes Relative 30 12 - 46 %   Lymphs Abs 1.8 0.7 - 4.0 K/uL   Monocytes Relative 7 3 - 12 %   Monocytes Absolute 0.4 0.1 - 1.0 K/uL   Eosinophils Relative  1 0 - 5 %   Eosinophils Absolute 0.1 0.0 - 0.7 K/uL   Basophils Relative 0 0 - 1 %   Basophils Absolute 0.0 0.0 - 0.1 K/uL  Basic metabolic panel  Result Value Ref Range   Sodium 137 135 - 145 mmol/L   Potassium 3.9 3.5 - 5.1 mmol/L   Chloride 103 96 - 112 mmol/L   CO2 24 19 - 32 mmol/L   Glucose, Bld 91 70 - 99 mg/dL   BUN 8 6 - 23 mg/dL   Creatinine, Ser 1.05 0.50 - 1.35 mg/dL   Calcium 9.1 8.4 - 10.5 mg/dL   GFR calc non Af Amer 88 (L) >90 mL/min   GFR calc Af Amer >90 >90 mL/min   Anion gap 10 5 -  15    Imaging Review Dg Chest 2 View  08/12/2014   CLINICAL DATA:  Dizziness and syncope  EXAM: CHEST  2 VIEW  COMPARISON:  None.  FINDINGS: Lungs are clear. Heart size and pulmonary vascularity are normal. No adenopathy. No bone lesions.  IMPRESSION: No edema or consolidation.   Electronically Signed   By: Lowella Grip III M.D.   On: 08/12/2014 15:23   Ct Head Wo Contrast  08/12/2014   CLINICAL DATA:  Near syncope. Tingling in both hands. Hit head 4 days ago on the edge of the bath tub.  EXAM: CT HEAD WITHOUT CONTRAST  CT CERVICAL SPINE WITHOUT CONTRAST  TECHNIQUE: Multidetector CT imaging of the head and cervical spine was performed following the standard protocol without intravenous contrast. Multiplanar CT image reconstructions of the cervical spine were also generated.  COMPARISON:  12/20/2007  FINDINGS: CT HEAD FINDINGS  There is no intra or extra-axial fluid collection or mass lesion. The basilar cisterns and ventricles have a normal appearance. There is no CT evidence for acute infarction or hemorrhage. Bone windows are unremarkable.  CT CERVICAL SPINE FINDINGS  There is normal alignment of the cervical spine. There is no evidence for acute fracture or dislocation. Prevertebral soft tissues have a normal appearance. Lung apices have a normal appearance. There is loss of cervical lordosis. This may be secondary to splinting, soft tissue injury, or positioning.  IMPRESSION: 1.  No  evidence for acute intracranial abnormality. 2. Loss of lordosis.  See above. 3. No evidence for acute fracture.   Electronically Signed   By: Nolon Nations M.D.   On: 08/12/2014 15:45   Ct Cervical Spine Wo Contrast  08/12/2014   CLINICAL DATA:  Near syncope. Tingling in both hands. Hit head 4 days ago on the edge of the bath tub.  EXAM: CT HEAD WITHOUT CONTRAST  CT CERVICAL SPINE WITHOUT CONTRAST  TECHNIQUE: Multidetector CT imaging of the head and cervical spine was performed following the standard protocol without intravenous contrast. Multiplanar CT image reconstructions of the cervical spine were also generated.  COMPARISON:  12/20/2007  FINDINGS: CT HEAD FINDINGS  There is no intra or extra-axial fluid collection or mass lesion. The basilar cisterns and ventricles have a normal appearance. There is no CT evidence for acute infarction or hemorrhage. Bone windows are unremarkable.  CT CERVICAL SPINE FINDINGS  There is normal alignment of the cervical spine. There is no evidence for acute fracture or dislocation. Prevertebral soft tissues have a normal appearance. Lung apices have a normal appearance. There is loss of cervical lordosis. This may be secondary to splinting, soft tissue injury, or positioning.  IMPRESSION: 1.  No evidence for acute intracranial abnormality. 2. Loss of lordosis.  See above. 3. No evidence for acute fracture.   Electronically Signed   By: Nolon Nations M.D.   On: 08/12/2014 15:45     EKG Interpretation   Date/Time:  Saturday August 12 2014 14:03:34 EST Ventricular Rate:  43 PR Interval:  180 QRS Duration: 77 QT Interval:  464 QTC Calculation: 392 R Axis:   83 Text Interpretation:  Sinus bradycardia Nonspecific ST elevation Lateral  leads are also involved No significant change since last tracing Confirmed  by Kathrynn Backstrom  MD, Day Deery [phone]) on 08/12/2014 2:19:13 PM      MDM   Final diagnoses:  Near syncope    Patient with sinus bradycardia asymptomatic  here. Heart rate consistently in the 40s. No hypotension. No fevers. Patient for near syncope  workup without significant findings other than sinus bradycardia. Head CT was negative neck was negative. Blood of the approach to this was patient did have a fall in the bathtub earlier in the week around Tuesday as possible that this could be concussive type symptoms. Patient complaint of dizziness while walking. And feeling like she's got pass out. Patient was at work today and the symptoms came back. The dizziness to lightheadedness and feeling like he may pass out. Went to urgent care urgent care sent him here for concerns for the bradycardia. Patient here had no significant symptoms. Patient also talked about some tingling in both hands at times. CT of neck was done due to the bilateral hand numbness and tingling.  Symptoms most likely related to a postconcussive syndrome. Do not think that the bradycardia is playing a significant role. Patient is very muscular and in good shape.    Fredia Sorrow, MD 08/13/14 [phone]

## 2014-08-15 ENCOUNTER — Emergency Department (HOSPITAL_COMMUNITY)
Admission: EM | Admit: 2014-08-15 | Discharge: 2014-08-15 | Disposition: A | Discharge status: Home / Self Care | Payer: Self-pay | Attending: Emergency Medicine | Admitting: Emergency Medicine

## 2014-08-15 DIAGNOSIS — Z791 Long term (current) use of non-steroidal anti-inflammatories (NSAID): Secondary | ICD-10-CM | POA: Insufficient documentation

## 2014-08-15 DIAGNOSIS — Z872 Personal history of diseases of the skin and subcutaneous tissue: Secondary | ICD-10-CM | POA: Insufficient documentation

## 2014-08-15 DIAGNOSIS — J45909 Unspecified asthma, uncomplicated: Secondary | ICD-10-CM | POA: Insufficient documentation

## 2014-08-15 DIAGNOSIS — Z09 Encounter for follow-up examination after completed treatment for conditions other than malignant neoplasm: Secondary | ICD-10-CM

## 2014-08-15 DIAGNOSIS — R001 Bradycardia, unspecified: Secondary | ICD-10-CM | POA: Insufficient documentation

## 2014-08-15 DIAGNOSIS — R011 Cardiac murmur, unspecified: Secondary | ICD-10-CM | POA: Insufficient documentation

## 2014-08-15 DIAGNOSIS — R42 Dizziness and giddiness: Secondary | ICD-10-CM | POA: Insufficient documentation

## 2014-08-15 DIAGNOSIS — Z8781 Personal history of (healed) traumatic fracture: Secondary | ICD-10-CM | POA: Insufficient documentation

## 2014-08-15 DIAGNOSIS — Z87828 Personal history of other (healed) physical injury and trauma: Secondary | ICD-10-CM | POA: Insufficient documentation

## 2014-08-15 DIAGNOSIS — Z91040 Latex allergy status: Secondary | ICD-10-CM | POA: Insufficient documentation

## 2014-08-15 DIAGNOSIS — Z79899 Other long term (current) drug therapy: Secondary | ICD-10-CM | POA: Insufficient documentation

## 2014-08-15 DIAGNOSIS — Z8679 Personal history of other diseases of the circulatory system: Secondary | ICD-10-CM | POA: Insufficient documentation

## 2014-08-15 DIAGNOSIS — Z7952 Long term (current) use of systemic steroids: Secondary | ICD-10-CM | POA: Insufficient documentation

## 2014-08-15 DIAGNOSIS — Z87891 Personal history of nicotine dependence: Secondary | ICD-10-CM | POA: Insufficient documentation

## 2014-08-15 DIAGNOSIS — Z021 Encounter for pre-employment examination: Secondary | ICD-10-CM | POA: Insufficient documentation

## 2014-08-15 NOTE — ED Notes (Signed)
Pt reports substernal cp and dizziness.  Pt was seen in ED this weekend for bradycardia.  Pt alert and oriented.

## 2014-08-15 NOTE — Progress Notes (Signed)
ED CM received consult for f/u and  establishing care a PCP. Patient has had 10 ED visits in the past 6 months, for related complaint.  Reviewed patient's record, no PCP or insurance listed in record. Met with patient at bedside,confirmed the information. Discussed [place] with patient and services rendered, patient agreeable with establishing care at the clinic. Provided written information about the [facility]. Instructed patient to attend the walk-in clinic tomorrow 08/16/14 at 9am to establish care and have prescriptions filled. Patient verbalized understanding teach back performed. No further ED CM needs identified

## 2014-08-15 NOTE — ED Provider Notes (Signed)
CSN: [id number]     Arrival date & time 08/15/14  1758 History   This chart was scribed for Clayton Bibles, PA-C working Ephraim Hamburger, MD by Randa Evens, ED Scribe. This patient was seen in room TR08C/TR08C and the patient's care was started at 6:50 PM.     Chief Complaint  Patient presents with  . Follow-up   The history is provided by the patient. No language interpreter was used.   HPI Comments: Adrian [name] is a 39 y.o. male who presents to the Emergency Department for a work note to be filled out for him to return to work.  He was seen in the ED this week for near syncope. Pt states that he feels light headed still from previous visit, notes that this is an ongoing chronic issue since this summer.  Reports he has intermittent headache and some sensitivity to light, is lightheaded with standing.  Does report a few head injuries from the summer.   The nurse's note states pt was reporting chest pain - he tells me that for years he has a habit of holding  his breath for long periods of time and when he breaths again he feels the "weird" feeling in his chest.  Denies any current chest pain.  Denies any change in his symptoms or any new or concerning symptoms.  States he is here just for paperwork to be filled out for his job.   No PCP    Past Medical History  Diagnosis Date  . Asthma   . Eczema   . Migraine   . Foot fracture, left   . Multiple food allergies     fish, tomatoes, peanuts, eggs, chicken and others  . Environmental allergies     grass  . Heart murmur    Past Surgical History  Procedure Laterality Date  . Inguinal hernia repair  1994   Family History  Problem Relation Age of Onset  . Asthma Father   . Clotting disorder      Aunt  . Breast cancer      Grandmother  . Prostate cancer      uncle  . Diabetes Mother   . Hypertension Mother   . Hyperlipidemia      grandparent  . Hypertension      grandparent/grandparent  . Kidney disease       aunt/uncle/grandparent  . Stroke      grandparent   History  Substance Use Topics  . Smoking status: Former Smoker    Types: Cigars    Quit date: 09/13/2012  . Smokeless tobacco: Not on file  . Alcohol Use: No    Review of Systems  Constitutional: Negative for fever.  Cardiovascular: Negative for chest pain.  Allergic/Immunologic: Negative for immunocompromised state.  Neurological: Positive for light-headedness. Negative for dizziness and syncope.  Psychiatric/Behavioral: Negative for self-injury.     Allergies  Peanut-containing drug products; Shellfish allergy; Tomato; and Latex  Home Medications   Prior to Admission medications   Medication Sig Start Date End Date Taking? Authorizing Provider  albuterol (PROVENTIL HFA;VENTOLIN HFA) 108 (90 BASE) MCG/ACT inhaler Inhale 1-2 puffs into the lungs every 6 (six) hours as needed for wheezing or shortness of breath. 04/18/14   Billy Fischer, MD  benzonatate (TESSALON) 100 MG capsule Take 2 capsules (200 mg total) by mouth 2 (two) times daily as needed for cough. Patient not taking: Reported on 06/23/2014 05/23/14   Jarrett Soho Muthersbaugh, PA-C  meloxicam (MOBIC) 7.5 MG tablet Take  1 tablet (7.5 mg total) by mouth daily. Patient taking differently: Take 7.5 mg by mouth daily as needed for pain.  06/23/14   Carman Ching, PA-C  predniSONE (DELTASONE) 20 MG tablet 2 tabs PO x 4 days Patient not taking: Reported on 08/12/2014 06/23/14   Carman Ching, PA-C  Spacer/Aero-Holding Chambers (AEROCHAMBER MAX [facility]) MISC 1 each by Other route once. 01/26/12   Gerda Diss, DO  traMADol (ULTRAM) 50 MG tablet Take 1 tablet (50 mg total) by mouth every 6 (six) hours as needed. Patient taking differently: Take 50 mg by mouth every 6 (six) hours as needed for moderate pain.  06/23/14   Carman Ching, PA-C  triamcinolone ointment (KENALOG) 0.5 % Apply 1 application topically 2 (two) times daily. 04/26/14   Kara Mead Merrell, PA-C   BP 138/70  mmHg  Pulse 47  Temp(Src) 97.3 F (36.3 C) (Oral)  Resp 18  SpO2 98%   Physical Exam  Constitutional: He appears well-developed and well-nourished. No distress.  HENT:  Head: Normocephalic and atraumatic.  Neck: Neck supple.  Cardiovascular: Bradycardia present.   Pulmonary/Chest: Effort normal.  Neurological: He is alert. He exhibits normal muscle tone.  Skin: He is not diaphoretic.  Psychiatric: He has a normal mood and affect. His behavior is normal.  Nursing note and vitals reviewed.   ED Course  Procedures (including critical care time) DIAGNOSTIC STUDIES: Oxygen Saturation is 98% on RA, normal by my interpretation.    COORDINATION OF CARE: 6:56 PM-Discussed treatment plan with pt at bedside and pt agreed to plan.     Labs Review Labs Reviewed - No data to display  Imaging Review No results found.   EKG Interpretation None      MDM   Final diagnoses:  Follow up    Afebrile, nontoxic patient with request for return to work note to be filled out, detailing what duties he may do.  He has chronic lightheadedness with an unknown cause and I do not think it is appropriate for me to send him back to full duty.  No change in his symptoms, no new symptoms.  Full workup in the ED this week.  I consulted case maanager - pt also seen by Laurena Slimmer who was able to schedule close follow up with [facility] and [facility].   D/C home with PCP follow up.   Discussed result, findings, treatment, and follow up  with patient.  Pt given return precautions.  Pt verbalizes understanding and agrees with plan.        I personally performed the services described in this documentation, which was scribed in my presence. The recorded information has been reviewed and is accurate.     Clayton Bibles, PA-C 08/15/14 2021  Ephraim Hamburger, MD 08/22/14 [phone]

## 2014-08-15 NOTE — Discharge Instructions (Signed)
Read the information below.  You may return to the Emergency Department at any time for worsening condition or any new symptoms that concern you.  Please follow up with [facility] tomorrow to discuss your chronic symptoms and ask them about your return to work status.

## 2014-08-15 NOTE — ED Notes (Signed)
Presents requesting follow up from this weekend-seen for low heart rate and dizziness, requesting work note to be filed out.

## 2014-08-16 ENCOUNTER — Ambulatory Visit: Payer: Self-pay | Attending: Internal Medicine | Admitting: Physician Assistant

## 2014-08-16 VITALS — BP 144/75 | HR 48 | Temp 98.9°F | Resp 16 | Ht 71.0 in | Wt 200.6 lb

## 2014-08-16 DIAGNOSIS — R42 Dizziness and giddiness: Secondary | ICD-10-CM

## 2014-08-16 DIAGNOSIS — R001 Bradycardia, unspecified: Secondary | ICD-10-CM | POA: Insufficient documentation

## 2014-08-16 DIAGNOSIS — R51 Headache: Secondary | ICD-10-CM | POA: Insufficient documentation

## 2014-08-16 DIAGNOSIS — G8929 Other chronic pain: Secondary | ICD-10-CM

## 2014-08-16 DIAGNOSIS — R519 Headache, unspecified: Secondary | ICD-10-CM

## 2014-08-16 MED ORDER — MECLIZINE HCL 25 MG PO TABS: 25.0000 mg | ORAL_TABLET | Freq: Three times a day (TID) | ORAL | Status: DC | PRN

## 2014-08-16 MED ORDER — SUMATRIPTAN SUCCINATE 50 MG PO TABS: 50.0000 mg | ORAL_TABLET | ORAL | Status: DC | PRN

## 2014-08-16 NOTE — Addendum Note (Signed)
Addended byZettie Pho S on: 08/16/2014 12:29 PM   Modules accepted: Level of Service

## 2014-08-16 NOTE — Progress Notes (Signed)
Pt presents to clinic with c/o dizziness and bradycardia [id number]. Pt requesting work paper to be signed by provider.

## 2014-08-16 NOTE — Progress Notes (Addendum)
Chief Complaint: Establish care  Subjective: 39 year old male with a history of asthma and migraines is here to establish care. He has been to the emergency department at least 10 times in the last 6 months. More recently when he's been his because of a fall that he sustained in his bathtub at home hitting his head. He's been having some dizziness every since. He had a thorough workup including CT of the head and neck that was within normal limits. His chest x-rays have been within normal limits as well as his labs. The only finding has been bradycardia with a high rate of 48. No evidence of heart block. This is been sinus bradycardia.  He also states that intermittently he has headaches mostly at the top of this. Occasionally around his eyes. No nausea or vomiting. He's had a lot of stress at home and at work. He is requesting to go back to work on Friday.   ROS:  GEN: denies fever or chills, denies change in weight Skin: denies lesions or rashes HEENT: + headache, earache, epistaxis, sore throat, or neck pain Lungs: Denies shortness of breath or dyspnea Cardiac: Denies chest pain or palpitations EXT: denies muscle spasms or swelling; no pain in lower ext, no weakness NEURO: denies numbness or tingling, denies sz, stroke or TIA   Objective:  Filed Vitals:   08/16/14 1204  BP: 144/75  Pulse: 48  Temp: 98.9 F (37.2 C)  TempSrc: Oral  Resp: 16  Height: 5\' 11"  (1.803 m)  Weight: 200 lb 9.6 oz (90.992 kg)  SpO2: 100%    Physical Exam:  General: in no acute distress. HEENT: no pallor, no icterus, moist oral mucosa, no JVD, no lymphadenopathy Heart: Normal  s1 &s2  Regular rate and rhythm, without murmurs, rubs, gallops. Neuro: Alert, awake, oriented x3, nonfocal.  Pertinent Lab Results:none   Medications: Prior to Admission medications   Medication Sig Start Date End Date Taking? Authorizing Provider  albuterol (PROVENTIL HFA;VENTOLIN HFA) 108 (90 BASE) MCG/ACT inhaler  Inhale 1-2 puffs into the lungs every 6 (six) hours as needed for wheezing or shortness of breath. 04/18/14   Billy Fischer, MD  benzonatate (TESSALON) 100 MG capsule Take 2 capsules (200 mg total) by mouth 2 (two) times daily as needed for cough. Patient not taking: Reported on 06/23/2014 05/23/14   Jarrett Soho Muthersbaugh, PA-C  meclizine (ANTIVERT) 25 MG tablet Take 1 tablet (25 mg total) by mouth 3 (three) times daily as needed for dizziness. 08/16/14   Juniper Snyders Daneil Dan, PA-C  meloxicam (MOBIC) 7.5 MG tablet Take 1 tablet (7.5 mg total) by mouth daily. Patient taking differently: Take 7.5 mg by mouth daily as needed for pain.  06/23/14   Carman Ching, PA-C  predniSONE (DELTASONE) 20 MG tablet 2 tabs PO x 4 days Patient not taking: Reported on 08/12/2014 06/23/14   Carman Ching, PA-C  Spacer/Aero-Holding Chambers (AEROCHAMBER MAX [facility]) MISC 1 each by Other route once. 01/26/12   Gerda Diss, DO  SUMAtriptan (IMITREX) 50 MG tablet Take 1 tablet (50 mg total) by mouth every 2 (two) hours as needed for migraine or headache. May repeat in 2 hours if headache persists or recurs. 08/16/14   Rini Moffit Daneil Dan, PA-C  traMADol (ULTRAM) 50 MG tablet Take 1 tablet (50 mg total) by mouth every 6 (six) hours as needed. Patient taking differently: Take 50 mg by mouth every 6 (six) hours as needed for moderate pain.  06/23/14   Carman Ching, PA-C  triamcinolone ointment (KENALOG) 0.5 % Apply 1 application topically 2 (two) times daily. 04/26/14   Elwyn Lade, PA-C    Assessment: 1. Dizziness, unclear etiology, recurrent 2. Recurrent headaches, tension versus migraine 3. Sinus bradycardia, stable  Plan: Meclizine as needed Imitrex as needed Return to work on Friday  Follow up:2 months  The patient was given clear instructions to go to ER or return to medical center if symptoms don't improve, worsen or new problems develop. The patient verbalized understanding. The patient was told to call to get  lab results if they haven't heard anything in the next week.   This note has been created with Surveyor, quantity. Any transcriptional errors are unintentional.   Zettie Pho, PA-C 08/16/2014, 12:21 PM

## 2014-09-08 ENCOUNTER — Ambulatory Visit: Payer: Self-pay

## 2014-10-28 ENCOUNTER — Encounter (HOSPITAL_COMMUNITY): Payer: Self-pay | Admitting: *Deleted

## 2014-10-28 ENCOUNTER — Emergency Department (HOSPITAL_COMMUNITY)
Admission: EM | Admit: 2014-10-28 | Discharge: 2014-10-28 | Disposition: A | Discharge status: Home / Self Care | Payer: Self-pay | Attending: Emergency Medicine | Admitting: Emergency Medicine

## 2014-10-28 DIAGNOSIS — S0511XA Contusion of eyeball and orbital tissues, right eye, initial encounter: Secondary | ICD-10-CM | POA: Insufficient documentation

## 2014-10-28 DIAGNOSIS — Y998 Other external cause status: Secondary | ICD-10-CM | POA: Insufficient documentation

## 2014-10-28 DIAGNOSIS — Z79899 Other long term (current) drug therapy: Secondary | ICD-10-CM | POA: Insufficient documentation

## 2014-10-28 DIAGNOSIS — Y9389 Activity, other specified: Secondary | ICD-10-CM | POA: Insufficient documentation

## 2014-10-28 DIAGNOSIS — Z87891 Personal history of nicotine dependence: Secondary | ICD-10-CM | POA: Insufficient documentation

## 2014-10-28 DIAGNOSIS — Z23 Encounter for immunization: Secondary | ICD-10-CM | POA: Insufficient documentation

## 2014-10-28 DIAGNOSIS — S0181XA Laceration without foreign body of other part of head, initial encounter: Secondary | ICD-10-CM | POA: Insufficient documentation

## 2014-10-28 DIAGNOSIS — Y9289 Other specified places as the place of occurrence of the external cause: Secondary | ICD-10-CM | POA: Insufficient documentation

## 2014-10-28 DIAGNOSIS — H1131 Conjunctival hemorrhage, right eye: Secondary | ICD-10-CM

## 2014-10-28 DIAGNOSIS — G43909 Migraine, unspecified, not intractable, without status migrainosus: Secondary | ICD-10-CM | POA: Insufficient documentation

## 2014-10-28 DIAGNOSIS — W228XXA Striking against or struck by other objects, initial encounter: Secondary | ICD-10-CM | POA: Insufficient documentation

## 2014-10-28 DIAGNOSIS — Z91040 Latex allergy status: Secondary | ICD-10-CM | POA: Insufficient documentation

## 2014-10-28 DIAGNOSIS — J45909 Unspecified asthma, uncomplicated: Secondary | ICD-10-CM | POA: Insufficient documentation

## 2014-10-28 DIAGNOSIS — R011 Cardiac murmur, unspecified: Secondary | ICD-10-CM | POA: Insufficient documentation

## 2014-10-28 DIAGNOSIS — S0501XA Injury of conjunctiva and corneal abrasion without foreign body, right eye, initial encounter: Secondary | ICD-10-CM | POA: Insufficient documentation

## 2014-10-28 MED ORDER — TETRACAINE HCL 0.5 % OP SOLN
1.0000 [drp] | Freq: Once | OPHTHALMIC | Status: AC
  Administered 2014-10-28: 1 [drp] via OPHTHALMIC
  Filled 2014-10-28: qty 2

## 2014-10-28 MED ORDER — ERYTHROMYCIN 5 MG/GM OP OINT: TOPICAL_OINTMENT | OPHTHALMIC | Status: DC

## 2014-10-28 MED ORDER — TETANUS-DIPHTH-ACELL PERTUSSIS 5-2.5-18.5 LF-MCG/0.5 IM SUSP
0.5000 mL | Freq: Once | INTRAMUSCULAR | Status: AC
  Administered 2014-10-28: 0.5 mL via INTRAMUSCULAR
  Filled 2014-10-28: qty 0.5

## 2014-10-28 MED ORDER — FLUORESCEIN SODIUM 1 MG OP STRP
1.0000 | ORAL_STRIP | Freq: Once | OPHTHALMIC | Status: AC
  Administered 2014-10-28: 1 via OPHTHALMIC
  Filled 2014-10-28: qty 1

## 2014-10-28 NOTE — Discharge Instructions (Signed)
1. Medications: erythromycin, usual home medications 2. Treatment: rest, drink plenty of fluids,  3. Follow Up: Please followup with ophthalmology in 48 hours for discussion of your diagnoses and further evaluation after today's visit; if you do not have a primary care doctor use the resource guide provided to find one; Please return to the ER for her sitting symptoms, syncopal episode, nausea and vomiting, high fevers or other concerns    Corneal Abrasion The cornea is the clear covering at the front and center of the eye. When looking at the colored portion of the eye (iris), you are looking through the cornea. This very thin tissue is made up of many layers. The surface layer is a single layer of cells (corneal epithelium) and is one of the most sensitive tissues in the body. If a scratch or injury causes the corneal epithelium to come off, it is called a corneal abrasion. If the injury extends to the tissues below the epithelium, the condition is called a corneal ulcer. CAUSES   Scratches.  Trauma.  Foreign body in the eye. Some people have recurrences of abrasions in the area of the original injury even after it has healed (recurrent erosion syndrome). Recurrent erosion syndrome generally improves and goes away with time. SYMPTOMS   Eye pain.  Difficulty or inability to keep the injured eye open.  The eye becomes very sensitive to light.  Recurrent erosions tend to happen suddenly, first thing in the morning, usually after waking up and opening the eye. DIAGNOSIS  Your health care provider can diagnose a corneal abrasion during an eye exam. Dye is usually placed in the eye using a drop or a small paper strip moistened by your tears. When the eye is examined with a special light, the abrasion shows up clearly because of the dye. TREATMENT   Small abrasions may be treated with antibiotic drops or ointment alone.  A pressure patch may be put over the eye. If this is done, follow your  doctor's instructions for when to remove the patch. Do not drive or use machines while the eye patch is on. Judging distances is hard to do with a patch on. If the abrasion becomes infected and spreads to the deeper tissues of the cornea, a corneal ulcer can result. This is serious because it can cause corneal scarring. Corneal scars interfere with light passing through the cornea and cause a loss of vision in the involved eye. HOME CARE INSTRUCTIONS  Use medicine or ointment as directed. Only take over-the-counter or prescription medicines for pain, discomfort, or fever as directed by your health care provider.  Do not drive or operate machinery if your eye is patched. Your ability to judge distances is impaired.  If your health care provider has given you a follow-up appointment, it is very important to keep that appointment. Not keeping the appointment could result in a severe eye infection or permanent loss of vision. If there is any problem keeping the appointment, let your health care provider know. SEEK MEDICAL CARE IF:   You have pain, light sensitivity, and a scratchy feeling in one eye or both eyes.  Your pressure patch keeps loosening up, and you can blink your eye under the patch after treatment.  Any kind of discharge develops from the eye after treatment or if the lids stick together in the morning.  You have the same symptoms in the morning as you did with the original abrasion days, weeks, or months after the abrasion healed. MAKE  SURE YOU:   Understand these instructions.  Will watch your condition.  Will get help right away if you are not doing well or get worse. Document Released: 06/13/2000 Document Revised: 06/21/2013 Document Reviewed: 02/21/2013 [facility] Patient Information 2015 [place], [place]. This information is not intended to replace advice given to you by your health care provider. Make sure you discuss any questions you have with your health care  provider.    Emergency Department Resource Guide 1) Find a Doctor and Pay Out of Pocket Although you won't have to find out who is covered by your insurance plan, it is a good idea to ask around and get recommendations. You will then need to call the office and see if the doctor you have chosen will accept you as a new patient and what types of options they offer for patients who are self-pay. Some doctors offer discounts or will set up payment plans for their patients who do not have insurance, but you will need to ask so you aren't surprised when you get to your appointment.  2) Contact Your Local Health Department Not all health departments have doctors that can see patients for sick visits, but many do, so it is worth a call to see if yours does. If you don't know where your local health department is, you can check in your phone book. The CDC also has a tool to help you locate your state's health department, and many state websites also have listings of all of their local health departments.  3) Find a [facility] If your illness is not likely to be very severe or complicated, you may want to try a walk in clinic. These are popping up all over the country in pharmacies, drugstores, and shopping centers. They're usually staffed by nurse practitioners or physician assistants that have been trained to treat common illnesses and complaints. They're usually fairly quick and inexpensive. However, if you have serious medical issues or chronic medical problems, these are probably not your best option.  No Primary Care Doctor: - Call Health Connect at  [phone] - they can help you locate a primary care doctor that  accepts your insurance, provides certain services, etc. - Physician Referral Service- [phone]  Chronic Pain Problems: Organization         Address  Phone   Notes  [facility]  [phone] Patients need to be referred by their primary care doctor.    Medication Assistance: Organization         Address  Phone   Notes  [facility] Medication [facility] [address] [place], [place] [address] [phone] --Must be a resident of [facility] -- Must have NO insurance coverage whatsoever (no Medicaid/ Medicare, etc.) -- The pt. MUST have a primary care doctor that directs their care regularly and follows them in the community   [facility]ist  [phone]   [employer]  [phone]    Agencies that provide inexpensive medical care: Organization         Address  Phone   Notes  [place]  [phone]   [facility]    [phone]   [facility] [place],  [phone]   [place] [address] [place] [phone]   Planned Parenthood    [phone]   [facility]    [phone]   Community  Health and [place]  [place] [address], [place] Phone:  [phone], Fax:  [phone] Hours of Operation:  9 am - 6 pm, M-F.  Also accepts Medicaid/Medicare and self-pay.  [facility] for [place], [address], [place] Phone: [phone], Fax: [phone]. Hours of Operation:  8:30 am - 5:30 pm, M-F.  Also accepts Medicaid and self-pay.  [facility] [address] [place] Phone: [phone]   [place], [place], [place] [phone], Ext. [phone] Mondays & Thursdays: 7-9 AM.  First 15 patients are seen on a first come, first serve basis.    [place] Providers:  Organization         Address  Phone   Notes  [facility] [address], [place] [phone] Also accepts self-pay patients.  [facility] [address], [place]  [phone]   [place], [address], [place] [phone]   [facility] [address] [place] [phone]   Lucianne Lei [address], [place]   [phone] Only accepts [place] Access [place] patients after they have their name applied to their card.   Self-Pay (no insurance) in [facility]:  Organization         Address  Phone   Notes  Sickle Cell Patients, [facility] Internal [facility] [place] [phone]   [facility] [place] [phone]   [facility] [place]  [place], [place],  [phone]   [facility]/Dr. Osei-Bonsu  [address], [place] or [address], [place] [phone] Phone number for both [place] and [place] locations is the same.  Urgent Medical and [facility] [address] [place] [phone]   [facility] [address] [place] or [address] [phone]   [facility] [address], [place] [phone], phone; [phone], fax Sees patients 1st and 3rd Saturday of every month.  Must not qualify for public or private insurance (i.e. Medicaid, Medicare, [place] Health Choice, Veterans' Benefits)  Household income should be no more than 200% of the poverty level The clinic cannot treat you if you are pregnant or think you are pregnant  Sexually transmitted diseases are not treated at the clinic.    Dental Care: Organization         Address  Phone  Notes  [facility] Department of [facility] [place] [phone] Accepts children up to age 38 who are enrolled in [place] or [place]; pregnant women with a Medicaid card; and children who have applied for Medicaid or [place] Health Choice, but were declined, whose parents can pay a reduced fee at time of service.   [facility] Department of [facility]  [address], [place] [phone] Accepts children up to age 45 who are enrolled in [place] or [place]; pregnant women with a Medicaid card; and children who have applied for Medicaid or [place] Health Choice, but were declined, whose parents can pay a reduced fee at time of service.  [place] Adult Dental Access PROGRAM  [phone]  [place], [place] [phone] Patients are seen by appointment only. Walk-ins are not accepted. [place] will see patients 21 years of age and older. Monday - Tuesday (8am-5pm) Most Wednesdays (8:30-5pm) $30 per visit, cash only  [facility] Adult Dental Access PROGRAM  [address], [facility] [phone] Patients are seen by appointment only. Walk-ins are not accepted. [place] will see patients 58 years of age and older. One Wednesday Evening (Monthly: Volunteer Based).  $30 per visit, cash only  [place]  [phone] for adults; Children under age 66, call Graduate Pediatric Dentistry at [phone]. Children aged 70-14, please call [phone] to request a pediatric application.  Dental services are provided in all areas of dental care including fillings, crowns and bridges, complete and partial dentures, implants, gum treatment, root canals, and extractions. Preventive care is also provided. Treatment is provided to both adults and children. Patients are selected via a lottery and there is often a waiting list.   [facility] [address], [place]  [phone] [email or web address]   [facility] [address] [place], [place] [phone], Ext. [phone] Second and Fourth Thursday of each month, opens at 6:30 AM; Clinic ends at 9 AM.  Patients are seen on a first-come first-served basis, and a limited number are seen during each clinic.   [facility]  [address] Hillard Danker [place], [place] [phone]   Eligibility Requirements You must have lived in [place], [place], or [place] counties for at least the last three months.   You cannot be eligible for state or federal sponsored [employer], including [employer], [place], or [employer].   You generally cannot be eligible for healthcare insurance through your employer.    How to apply: Eligibility screenings are held every Tuesday and Wednesday afternoon from 1:00 pm until 4:00 pm. You do not need an appointment for the interview!  [facility] [address], [place], [place]   [place]  [place] Department  [place]  [phone]    Behavioral Health Resources in the Community: Intensive Outpatient Programs Organization         Address  Phone  Notes  [place] [address] [place], [place] [phone]   [facility] Outpatient [address] [place], [place]   ADS: Alcohol & Drug Svcs [address] [place], [place]   [place] [address],  [place], [place] or [phone]   Substance Abuse Resources Organization         Address  Phone  Notes  Alcohol and Drug Services  [phone]   [place]  [phone]   [facility] [place]   Chinita Pester  [phone]   Residential & Outpatient Substance Abuse Program  [phone]   Psychological Services Organization         Address  Phone  Notes  [facility] [place]  [place]  [phone]   [place] [address], [place] or [phone]    Mobile Crisis Teams Organization         Address  Phone  Notes  [facility], Mobile Crisis Care Unit  [phone]   [facility]ive Psychotherapeutic Services  [address] [place], [place]   Ivin Booty  [place] [phone]    Self-Help/Support Groups Organization         Address  Phone             Notes  [facility] of [place] - variety of support groups  [place] Call for more information  Narcotics Anonymous (NA), [facility]aring [facility] [address], [employer] [place]  2 meetings at this location   Special educational needs teacher         Address  Phone  Notes  [facility] Residential Treatment [place],    [place]  [phone]   [facility]  [address], [place] [phone], [place], [place]   [place], [place] [phone] Admissions: 8am-3pm M-F  Incentives Substance [place] [address],    [place], [place] [phone]   [facility] [address] [place], [place], [place]   [facility] [address]  [place], [place]   [facility] Programs - Intensive Outpatient [address] Kristeen [address], [place], [place]   [facility] [place].,  [place], [place] [phone] or [phone]   [facility] Treatment Services (RTS) [address] [place], [place] Accepts Medicaid  [facility] [place] [address]  [place] [phone] Substance Abuse/Addiction Treatment   [facility] Organization         Address  Phone  Notes  [facility]  [phone]   Domenic Schwab, PhD [address] Arlis [place], [place]   [phone] or [phone]   [place] [address] [place], [place] [phone]   [facility] [address], [place], [place] [phone] Insurance/Medicaid/sponsorship through [facility] and Families [address], [address] [place]                                    [place], [place] [phone]  [place] [address], [place] [phone]    Dr. Adele Schilder  [phone]   [facility] Dept. 1) [address] [place] 2) [place] 3)  [place] [address], [place] [phone]  [phone]   [place] [phone] or [phone] (After Hours)

## 2014-10-28 NOTE — ED Notes (Signed)
Pt. Left with all belongings and refused wheelchair 

## 2014-10-28 NOTE — ED Notes (Signed)
At 1130 am today the pt struck his head on his car door.  No loc.  Small cut to the rt eyebrow painful since then and his eye is red

## 2014-10-28 NOTE — ED Provider Notes (Signed)
CSN: [id number]     Arrival date & time 10/28/14  2000 History  This chart was scribed for non-physician practitioner Jarrett Soho Gavinn Collard PA-C working with No att. providers found by Lora Havens, ED Scribe. This patient was seen in TR04C/TR04C and the patient's care was started at 10:00 PM.   Chief Complaint  Patient presents with  . Eye Injury   Patient is a 39 y.o. male presenting with eye injury. The history is provided by the patient and medical records. No language interpreter was used.  Eye Injury Pertinent negatives include no chest pain, no abdominal pain, no headaches and no shortness of breath.    HPI Comments: Adrian [name] is a 39 y.o. male who presents to the Emergency Department complaining of an injury to his right eye and a laceration to the forhead acute onset this afternoon around 11 AM-12 PM. Pt struck his head on the car door when opening the door. He took a nap later and woke up with increased pain in his right eye. Patient denies loss of consciousness, falling.  He reports that he initially had double vision for several minutes after the injury which quickly resolved and has not returned. He denies blurry vision, neck pain, neck stiffness. He reports that several times he has felt dizzy which she describes as the room spinning however this lasts for only several seconds and then resolves spontaneously.  He denies nausea or vomiting, numbness, weakness.  He is unsure of last tetanus.   Past Medical History  Diagnosis Date  . Asthma   . Eczema   . Migraine   . Foot fracture, left   . Multiple food allergies     fish, tomatoes, peanuts, eggs, chicken and others  . Environmental allergies     grass  . Heart murmur    Past Surgical History  Procedure Laterality Date  . Inguinal hernia repair  1994   Family History  Problem Relation Age of Onset  . Asthma Father   . Clotting disorder      Aunt  . Breast cancer      Grandmother  . Prostate cancer      uncle   . Diabetes Mother   . Hypertension Mother   . Hyperlipidemia      grandparent  . Hypertension      grandparent/grandparent  . Kidney disease      aunt/uncle/grandparent  . Stroke      grandparent   History  Substance Use Topics  . Smoking status: Former Smoker    Types: Cigars    Quit date: 09/13/2012  . Smokeless tobacco: Not on file  . Alcohol Use: No    Review of Systems  Constitutional: Negative for fever, diaphoresis, appetite change, fatigue and unexpected weight change.  HENT: Negative for mouth sores.   Eyes: Positive for pain, redness and visual disturbance.  Respiratory: Negative for cough, chest tightness, shortness of breath and wheezing.   Cardiovascular: Negative for chest pain.  Gastrointestinal: Negative for nausea, vomiting, abdominal pain, diarrhea and constipation.  Endocrine: Negative for polydipsia, polyphagia and polyuria.  Genitourinary: Negative for dysuria, urgency, frequency and hematuria.  Musculoskeletal: Negative for back pain and neck stiffness.  Skin: Positive for wound. Negative for rash.  Allergic/Immunologic: Negative for immunocompromised state.  Neurological: Negative for syncope, light-headedness and headaches.  Hematological: Does not bruise/bleed easily.  Psychiatric/Behavioral: Negative for sleep disturbance. The patient is not nervous/anxious.     Allergies  Peanut-containing drug products; Shellfish allergy; Tomato; and Latex  Home Medications   Prior to Admission medications   Medication Sig Start Date End Date Taking? Authorizing Provider  albuterol (PROVENTIL HFA;VENTOLIN HFA) 108 (90 BASE) MCG/ACT inhaler Inhale 1-2 puffs into the lungs every 6 (six) hours as needed for wheezing or shortness of breath. 04/18/14   Billy Fischer, MD  benzonatate (TESSALON) 100 MG capsule Take 2 capsules (200 mg total) by mouth 2 (two) times daily as needed for cough. Patient not taking: Reported on 06/23/2014 05/23/14   Jarrett Soho Viriginia Amendola,  PA-C  erythromycin ophthalmic ointment Place a 1/2 inch ribbon of ointment into the lower eyelid every 4 hours for 5 days 10/28/14   Jarrett Soho Katria Botts, PA-C  meclizine (ANTIVERT) 25 MG tablet Take 1 tablet (25 mg total) by mouth 3 (three) times daily as needed for dizziness. 08/16/14   Tiffany Daneil Dan, PA-C  meloxicam (MOBIC) 7.5 MG tablet Take 1 tablet (7.5 mg total) by mouth daily. Patient taking differently: Take 7.5 mg by mouth daily as needed for pain.  06/23/14   Carman Ching, PA-C  predniSONE (DELTASONE) 20 MG tablet 2 tabs PO x 4 days Patient not taking: Reported on 08/12/2014 06/23/14   Carman Ching, PA-C  Spacer/Aero-Holding Chambers (AEROCHAMBER MAX [facility]) MISC 1 each by Other route once. 01/26/12   Gerda Diss, DO  SUMAtriptan (IMITREX) 50 MG tablet Take 1 tablet (50 mg total) by mouth every 2 (two) hours as needed for migraine or headache. May repeat in 2 hours if headache persists or recurs. 08/16/14   Tiffany Daneil Dan, PA-C  traMADol (ULTRAM) 50 MG tablet Take 1 tablet (50 mg total) by mouth every 6 (six) hours as needed. Patient taking differently: Take 50 mg by mouth every 6 (six) hours as needed for moderate pain.  06/23/14   Carman Ching, PA-C  triamcinolone ointment (KENALOG) 0.5 % Apply 1 application topically 2 (two) times daily. 04/26/14   Cleatrice Burke, PA-C   BP 126/64 mmHg  Pulse 44  Temp(Src) 97.5 F (36.4 C) (Oral)  Resp 14  SpO2 100% Physical Exam  Constitutional: He is oriented to person, place, and time. He appears well-developed and well-nourished. No distress.  HENT:  Head: Normocephalic. Head is with laceration.  Right Ear: Tympanic membrane, external ear and ear canal normal.  Left Ear: Tympanic membrane, external ear and ear canal normal.  Nose: Nose normal. No mucosal edema or rhinorrhea. No epistaxis. Right sinus exhibits no maxillary sinus tenderness and no frontal sinus tenderness. Left sinus exhibits no maxillary sinus tenderness and no frontal  sinus tenderness.  Mouth/Throat: Uvula is midline, oropharynx is clear and moist and mucous membranes are normal. Mucous membranes are not pale and not cyanotic. No uvula swelling. No oropharyngeal exudate, posterior oropharyngeal edema, posterior oropharyngeal erythema or tonsillar abscesses.  1.5 cm laceration to the central forehead  Eyes: Conjunctivae, EOM and lids are normal. Pupils are equal, round, and reactive to light. Lids are everted and swept, no foreign bodies found. Right eye exhibits no chemosis, no discharge and no exudate. No foreign body present in the right eye. Left eye exhibits no chemosis, no discharge and no exudate. No foreign body present in the left eye. Right conjunctiva is not injected. Right conjunctiva has no hemorrhage. Left conjunctiva is not injected. Left conjunctiva has no hemorrhage.  Slit lamp exam:      The right eye shows no corneal abrasion, no corneal flare, no corneal ulcer, no foreign body, no fluorescein uptake and no anterior chamber bulge.  The left eye shows no corneal abrasion, no corneal flare, no corneal ulcer, no foreign body, no fluorescein uptake and no anterior chamber bulge.  Pupils equal round and reactive to light No vertical, horizontal or rotational nystagmus  Crescent shaped Corneal abrasion noted to the right eye at the 10 o'clock position with fluorescein uptake Negative Seidel sign No visible foreign body No corneal flare, ulcer or dendritic staining  No herpetic lesions to the face or around the eye Swelling noted to the upper and lower lids of the right eye with associated ecchymosis  Tono: 11.5  Visual Acuity:  Bilateral Distance: 20/40 ;  R Distance: 20/70 ;  L Distance: 20/40  Neck: Normal range of motion and full passive range of motion without pain.  Full range of motion without pain No midline or paraspinal tenderness No nuchal rigidity; no meningeal signs  Cardiovascular: Normal rate, regular rhythm, normal heart  sounds and intact distal pulses.   No murmur heard. Pulmonary/Chest: Effort normal and breath sounds normal. No stridor. No respiratory distress.  Clear and equal breath sounds without focal wheezes, rhonchi, rales  Abdominal: Soft. Bowel sounds are normal. There is no tenderness.  Musculoskeletal: Normal range of motion.  Lymphadenopathy:    He has no cervical adenopathy.  Neurological: He is alert and oriented to person, place, and time.  Mental Status:  Alert, oriented, thought content appropriate, able to give a coherent history. Speech fluent without evidence of aphasia. Able to follow 2 step commands without difficulty.  Cranial Nerves:  II:  Peripheral visual fields grossly normal, pupils equal, round, reactive to light III,IV, VI: ptosis not present, extra-ocular motions intact bilaterally  V,VII: smile symmetric, facial light touch sensation equal VIII: hearing grossly normal to voice  X: uvula elevates symmetrically  XI: bilateral shoulder shrug symmetric and strong XII: midline tongue extension without fassiculations Motor:  Normal tone. 5/5 in upper and lower extremities bilaterally including strong and equal grip strength and dorsiflexion/plantar flexion Sensory: Pinprick and light touch normal in all extremities.  Deep Tendon Reflexes: 2+ and symmetric in the biceps and patella Cerebellar: normal finger-to-nose with bilateral upper extremities Gait: normal gait and balance CV: distal pulses palpable throughout  No clonus  Skin: Skin is warm and dry. No rash noted. He is not diaphoretic. No erythema.  Psychiatric: He has a normal mood and affect.  Nursing note and vitals reviewed.   ED Course  LACERATION REPAIR Date/Time: 10/28/2014 10:44 PM Performed by: Abigail Butts Authorized by: Abigail Butts Consent: Verbal consent obtained. Risks and benefits: risks, benefits and alternatives were discussed Consent given by: patient Patient understanding:  patient states understanding of the procedure being performed Patient consent: the patient's understanding of the procedure matches consent given Procedure consent: procedure consent matches procedure scheduled Relevant documents: relevant documents present and verified Site marked: the operative site was marked Required items: required blood products, implants, devices, and special equipment available Patient identity confirmed: verbally with patient and arm band Time out: Immediately prior to procedure a "time out" was called to verify the correct patient, procedure, equipment, support staff and site/side marked as required. Body area: head/neck Location details: forehead Laceration length: 1.5 cm Foreign bodies: no foreign bodies Tendon involvement: none Nerve involvement: none Vascular damage: no Patient sedated: no Preparation: Patient was prepped and draped in the usual sterile fashion. Irrigation solution: saline Irrigation method: syringe Amount of cleaning: standard Debridement: none Degree of undermining: none Skin closure: glue Approximation: close Approximation difficulty: simple Patient tolerance: Patient tolerated  the procedure well with no immediate complications   DIAGNOSTIC STUDIES: Oxygen Saturation is 100% on room air, normal by my interpretation.    COORDINATION OF CARE: 10:17 PM Discussed treatment plan with pt at bedside and pt agreed to plan.  Labs Review Labs Reviewed - No data to display  Imaging Review No results found.   EKG Interpretation None      MDM   Final diagnoses:  Corneal abrasion, right, initial encounter  Forehead laceration, initial encounter  Contusion of right eye, initial encounter  Subconjunctival hematoma, right   Adrian [name] presents with small laceration to the central for head and contusion to the right eye. On slit-lamp exam patient with small crescent-shaped corneal abrasion but negative Seidel sign and no  evidence of ruptured globe. Visual acuity consistent with patient's baseline. He reports that he has "poor vision" at baseline but has never seen an ophthalmologist.  No increased intraocular pressure. No diplopia or pain with extraocular movements. Patient with normal neurologic exam and steady gait.  Laceration repaired. Antibiotics given for corneal abrasion. Patient is to follow-up with ophthalmology on Monday morning. Reasons discussed to return to the emergency department.  BP 126/64 mmHg  Pulse 44  Temp(Src) 97.5 F (36.4 C) (Oral)  Resp 14  SpO2 100%  I personally performed the services described in this documentation, which was scribed in my presence. The recorded information has been reviewed and is accurate.   Jarrett Soho Lucynda Rosano, PA-C 10/29/14 [id number]  Charlesetta Shanks, MD 10/30/14 [phone]

## 2014-11-13 ENCOUNTER — Encounter (HOSPITAL_COMMUNITY): Payer: Self-pay

## 2014-11-13 ENCOUNTER — Emergency Department (HOSPITAL_COMMUNITY)
Admission: EM | Admit: 2014-11-13 | Discharge: 2014-11-13 | Disposition: A | Discharge status: Home / Self Care | Payer: Self-pay | Attending: Emergency Medicine | Admitting: Emergency Medicine

## 2014-11-13 DIAGNOSIS — Z87891 Personal history of nicotine dependence: Secondary | ICD-10-CM | POA: Insufficient documentation

## 2014-11-13 DIAGNOSIS — G43909 Migraine, unspecified, not intractable, without status migrainosus: Secondary | ICD-10-CM | POA: Insufficient documentation

## 2014-11-13 DIAGNOSIS — R202 Paresthesia of skin: Secondary | ICD-10-CM | POA: Insufficient documentation

## 2014-11-13 DIAGNOSIS — Z791 Long term (current) use of non-steroidal anti-inflammatories (NSAID): Secondary | ICD-10-CM | POA: Insufficient documentation

## 2014-11-13 DIAGNOSIS — Z91040 Latex allergy status: Secondary | ICD-10-CM | POA: Insufficient documentation

## 2014-11-13 DIAGNOSIS — Z8781 Personal history of (healed) traumatic fracture: Secondary | ICD-10-CM | POA: Insufficient documentation

## 2014-11-13 DIAGNOSIS — R011 Cardiac murmur, unspecified: Secondary | ICD-10-CM | POA: Insufficient documentation

## 2014-11-13 DIAGNOSIS — J45909 Unspecified asthma, uncomplicated: Secondary | ICD-10-CM | POA: Insufficient documentation

## 2014-11-13 DIAGNOSIS — Z7952 Long term (current) use of systemic steroids: Secondary | ICD-10-CM | POA: Insufficient documentation

## 2014-11-13 DIAGNOSIS — R001 Bradycardia, unspecified: Secondary | ICD-10-CM | POA: Insufficient documentation

## 2014-11-13 DIAGNOSIS — Z872 Personal history of diseases of the skin and subcutaneous tissue: Secondary | ICD-10-CM | POA: Insufficient documentation

## 2014-11-13 LAB — I-STAT CHEM 8, ED
BUN: 7 mg/dL (ref 6–20)
CALCIUM ION: 1.19 mmol/L (ref 1.12–1.23)
CREATININE: 1.1 mg/dL (ref 0.61–1.24)
Chloride: 102 mmol/L (ref 101–111)
Glucose, Bld: 89 mg/dL (ref 65–99)
HCT: 41 % (ref 39.0–52.0)
HEMOGLOBIN: 13.9 g/dL (ref 13.0–17.0)
Potassium: 4 mmol/L (ref 3.5–5.1)
Sodium: 141 mmol/L (ref 135–145)
TCO2: 25 mmol/L (ref 0–100)

## 2014-11-13 NOTE — ED Notes (Signed)
Pt stated his heart rate is always low and it normally run between 45 and low 50s

## 2014-11-13 NOTE — Discharge Instructions (Signed)
Buy simple wrist splints and wear these on both wrists at night. Follow-up with the [facility] in 1-2 weeks to check for improvement. If you notice weakness or worse numbness come back to the ER.  Carpal Tunnel Syndrome The carpal tunnel is a narrow area located on the palm side of your wrist. The tunnel is formed by the wrist bones and ligaments. Nerves, blood vessels, and tendons pass through the carpal tunnel. Repeated wrist motion or certain diseases may cause swelling within the tunnel. This swelling pinches the main nerve in the wrist (median nerve) and causes the painful hand and arm condition called carpal tunnel syndrome. CAUSES   Repeated wrist motions.  Wrist injuries.  Certain diseases like arthritis, diabetes, alcoholism, hyperthyroidism, and kidney failure.  Obesity.  Pregnancy. SYMPTOMS   A "pins and needles" feeling in your fingers or hand, especially in your thumb, index and middle fingers.  Tingling or numbness in your fingers or hand.  An aching feeling in your entire arm, especially when your wrist and elbow are bent for long periods of time.  Wrist pain that goes up your arm to your shoulder.  Pain that goes down into your palm or fingers.  A weak feeling in your hands. DIAGNOSIS  Your health care provider will take your history and perform a physical exam. An electromyography test may be needed. This test measures electrical signals sent out by your nerves into the muscles. The electrical signals are usually slowed by carpal tunnel syndrome. You may also need X-rays. TREATMENT  Carpal tunnel syndrome may clear up by itself. Your health care provider may recommend a wrist splint or medicine such as a nonsteroidal anti-inflammatory medicine. Cortisone injections may help. Sometimes, surgery may be needed to free the pinched nerve.  HOME CARE INSTRUCTIONS   Take all medicine as directed by your health care provider. Only take over-the-counter  or prescription medicines for pain, discomfort, or fever as directed by your health care provider.  If you were given a splint to keep your wrist from bending, wear it as directed. It is important to wear the splint at night. Wear the splint for as long as you have pain or numbness in your hand, arm, or wrist. This may take 1 to 2 months.  Rest your wrist from any activity that may be causing your pain. If your symptoms are work-related, you may need to talk to your employer about changing to a job that does not require using your wrist.  Put ice on your wrist after long periods of wrist activity.  Put ice in a plastic bag.  Place a towel between your skin and the bag.  Leave the ice on for 15-20 minutes, 03-04 times a day.  Keep all follow-up visits as directed by your health care provider. This includes any orthopedic referrals, physical therapy, and rehabilitation. Any delay in getting necessary care could result in a delay or failure of your condition to heal. SEEK IMMEDIATE MEDICAL CARE IF:   You have new, unexplained symptoms.  Your symptoms get worse and are not helped or controlled with medicines. MAKE SURE YOU:   Understand these instructions.  Will watch your condition.  Will get help right away if you are not doing well or get worse. Document Released: 06/13/2000 Document Revised: 10/31/2013 Document Reviewed: 05/02/2011 [facility] Patient Information 2015 [place], [place]. This information is not intended to replace advice given to you by your health care provider. Make sure you discuss any questions  you have with your health care provider.  Paresthesia Paresthesia is an abnormal burning or prickling sensation. This sensation is generally felt in the hands, arms, legs, or feet. However, it may occur in any part of the body. It is usually not painful. The feeling may be described as:  Tingling or numbness.  "Pins and needles."  Skin crawling.  Buzzing.  Limbs "falling  asleep."  Itching. Most people experience temporary (transient) paresthesia at some time in their lives. CAUSES  Paresthesia may occur when you breathe too quickly (hyperventilation). It can also occur without any apparent cause. Commonly, paresthesia occurs when pressure is placed on a nerve. The feeling quickly goes away once the pressure is removed. For some people, however, paresthesia is a long-lasting (chronic) condition caused by an underlying disorder. The underlying disorder may be:  A traumatic, direct injury to nerves. Examples include a:  Broken (fractured) neck.  Fractured skull.  A disorder affecting the brain and spinal cord (central nervous system). Examples include:  Transverse myelitis.  Encephalitis.  Transient ischemic attack.  Multiple sclerosis.  Stroke.  Tumor or blood vessel problems, such as an arteriovenous malformation pressing against the brain or spinal cord.  A condition that damages the peripheral nerves (peripheral neuropathy). Peripheral nerves are not part of the brain and spinal cord. These conditions include:  Diabetes.  Peripheral vascular disease.  Nerve entrapment syndromes, such as carpal tunnel syndrome.  Shingles.  Hypothyroidism.  Vitamin B12 deficiencies.  Alcoholism.  Heavy metal poisoning (lead, arsenic).  Rheumatoid arthritis.  Systemic lupus erythematosus. DIAGNOSIS  Your caregiver will attempt to find the underlying cause of your paresthesia. Your caregiver may:  Take your medical history.  Perform a physical exam.  Order various lab tests.  Order imaging tests. TREATMENT  Treatment for paresthesia depends on the underlying cause. HOME CARE INSTRUCTIONS  Avoid drinking alcohol.  You may consider massage or acupuncture to help relieve your symptoms.  Keep all follow-up appointments as directed by your caregiver. SEEK IMMEDIATE MEDICAL CARE IF:   You feel weak.  You have trouble walking or  moving.  You have problems with speech or vision.  You feel confused.  You cannot control your bladder or bowel movements.  You feel numbness after an injury.  You faint.  Your burning or prickling feeling gets worse when walking.  You have pain, cramps, or dizziness.  You develop a rash. MAKE SURE YOU:  Understand these instructions.  Will watch your condition.  Will get help right away if you are not doing well or get worse. Document Released: 06/06/2002 Document Revised: 09/08/2011 Document Reviewed: 03/07/2011 [facility] Patient Information 2015 [place], [place]. This information is not intended to replace advice given to you by your health care provider. Make sure you discuss any questions you have with your health care provider.

## 2014-11-13 NOTE — ED Notes (Signed)
Pt reports that he has been having  Bilateral numbness and tingling to his hands.  Radiates into his forearms.  Pt. Reports that he has to get up an hour early to get ready for work , due to his hands being numb and his ability to get ready to work

## 2014-11-13 NOTE — ED Provider Notes (Signed)
CSN: [id number]     Arrival date & time 11/13/14  1287 History   First MD Initiated Contact with Patient 11/13/14 [phone]     Chief Complaint  Patient presents with  . Numbness     (Consider location/radiation/quality/duration/timing/severity/associated sxs/prior Treatment) HPI  39 year old male presents with bilateral hand numbness for the past 6 months. His been progressively worsening. It occurs intermittently and throughout the day but does seem to be worst when he first wakes up. He has to wake up an hour earlier than typical to shake out his hands and get the numbness to start improving so he can put on his clothes. There is no weakness associated with it. The numbness is in every single finger in the palm is hand. Does not go past his wrist. No known injuries. No weakness or numbness in any other extremities. No headaches. No elbow pain or trauma. 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  Past Medical History  Diagnosis Date  . Asthma   . Eczema   . Migraine   . Foot fracture, left   . Multiple food allergies     fish, tomatoes, peanuts, eggs, chicken and others  . Environmental allergies     grass  . Heart murmur    Past Surgical History  Procedure Laterality Date  . Inguinal hernia repair  1994   Family History  Problem Relation Age of Onset  . Asthma Father   . Clotting disorder      Aunt  . Breast cancer      Grandmother  . Prostate cancer      uncle  . Diabetes Mother   . Hypertension Mother   . Hyperlipidemia      grandparent  . Hypertension      grandparent/grandparent  . Kidney disease      aunt/uncle/grandparent  . Stroke      grandparent   History  Substance Use Topics  . Smoking status: Former Smoker    Types: Cigars    Quit date: 09/13/2012  . Smokeless tobacco: Not on file  . Alcohol Use: No    Review of Systems  Constitutional: Negative for fever.  Musculoskeletal: Negative for joint swelling,  arthralgias and neck pain.  Neurological: Positive for numbness. Negative for weakness and headaches.  All other systems reviewed and are negative.     Allergies  Peanut-containing drug products; Shellfish allergy; Tomato; and Latex  Home Medications   Prior to Admission medications   Medication Sig Start Date End Date Taking? Authorizing Provider  albuterol (PROVENTIL HFA;VENTOLIN HFA) 108 (90 BASE) MCG/ACT inhaler Inhale 1-2 puffs into the lungs every 6 (six) hours as needed for wheezing or shortness of breath. 04/18/14   Billy Fischer, MD  benzonatate (TESSALON) 100 MG capsule Take 2 capsules (200 mg total) by mouth 2 (two) times daily as needed for cough. Patient not taking: Reported on 06/23/2014 05/23/14   Jarrett Soho Muthersbaugh, PA-C  erythromycin ophthalmic ointment Place a 1/2 inch ribbon of ointment into the lower eyelid every 4 hours for 5 days 10/28/14   Jarrett Soho Muthersbaugh, PA-C  meclizine (ANTIVERT) 25 MG tablet Take 1 tablet (25 mg total) by mouth 3 (three) times daily as needed for dizziness. 08/16/14   Tiffany Daneil Dan, PA-C  meloxicam (MOBIC) 7.5 MG tablet Take 1 tablet (7.5 mg total) by mouth daily. Patient taking differently: Take 7.5 mg by mouth daily as needed for pain.  06/23/14   Hessie Diener  Hess, PA-C  predniSONE (DELTASONE) 20 MG tablet 2 tabs PO x 4 days Patient not taking: Reported on 08/12/2014 06/23/14   Carman Ching, PA-C  Spacer/Aero-Holding Chambers (AEROCHAMBER MAX [facility]) MISC 1 each by Other route once. 01/26/12   Gerda Diss, DO  SUMAtriptan (IMITREX) 50 MG tablet Take 1 tablet (50 mg total) by mouth every 2 (two) hours as needed for migraine or headache. May repeat in 2 hours if headache persists or recurs. 08/16/14   Tiffany Daneil Dan, PA-C  traMADol (ULTRAM) 50 MG tablet Take 1 tablet (50 mg total) by mouth every 6 (six) hours as needed. Patient taking differently: Take 50 mg by mouth every 6 (six) hours as needed for moderate pain.  06/23/14   Carman Ching,  PA-C  triamcinolone ointment (KENALOG) 0.5 % Apply 1 application topically 2 (two) times daily. 04/26/14   Cleatrice Burke, PA-C   BP 124/76 mmHg  Pulse 53  Temp(Src) 98.2 F (36.8 C) (Oral)  Resp 18  SpO2 100% Physical Exam  Constitutional: He is oriented to person, place, and time. He appears well-developed and well-nourished.  HENT:  Head: Normocephalic and atraumatic.  Right Ear: External ear normal.  Left Ear: External ear normal.  Nose: Nose normal.  Eyes: Right eye exhibits no discharge. Left eye exhibits no discharge.  Neck: Neck supple.  Cardiovascular: Regular rhythm, normal heart sounds and intact distal pulses.  Bradycardia present.   Pulmonary/Chest: Effort normal.  Abdominal: He exhibits no distension.  Musculoskeletal: He exhibits no edema.  Neurological: He is alert and oriented to person, place, and time.  5/5 strength in all 4 extremities. Specifically normal strength and function of ulnar, median and radial nerve bilaterally. Normal gross sensation to hands No worsening of symptoms with Tinel or Phalen's test  Skin: Skin is warm and dry.  Nursing note and vitals reviewed.   ED Course  Procedures (including critical care time) Labs Review Labs Reviewed  I-STAT CHEM 8, ED    Imaging Review No results found.   EKG Interpretation None      MDM   Final diagnoses:  Paresthesia of hand, bilateral    No significant electrolye abnormality to suggest a metabolic cause for his numbness. The distribution does not 100% correlate but his history is most consistent with a likely carpal tunnel syndrome bilaterally. I have recommended Velcro wrist splints to wear at night and will give a PCP follow-up in the [place] [facility] [facility] to document if this improves as well as further workup as an outpatient. Neurologically intact here.    Sherwood Gambler, MD 11/13/14 [phone]

## 2014-11-16 ENCOUNTER — Ambulatory Visit: Payer: Self-pay | Attending: Family Medicine | Admitting: Family Medicine

## 2014-11-16 VITALS — BP 132/83 | HR 43 | Wt 198.4 lb

## 2014-11-16 DIAGNOSIS — M25539 Pain in unspecified wrist: Secondary | ICD-10-CM

## 2014-11-16 LAB — RHEUMATOID FACTOR

## 2014-11-16 LAB — C-REACTIVE PROTEIN: CRP: <0.5 mg/dL (ref <0.60)

## 2014-11-16 MED ORDER — PREDNISONE 20 MG PO TABS
ORAL_TABLET | ORAL | Status: DC
Start: 2014-11-16 — End: 2014-12-15

## 2014-11-16 MED ORDER — TRIAMCINOLONE ACETONIDE 0.5 % EX OINT: 1.0000 "application " | TOPICAL_OINTMENT | Freq: Two times a day (BID) | CUTANEOUS | Status: DC

## 2014-11-16 MED ORDER — TRAMADOL HCL 50 MG PO TABS: 50.0000 mg | ORAL_TABLET | Freq: Four times a day (QID) | ORAL | Status: DC | PRN

## 2014-11-16 MED ORDER — MECLIZINE HCL 25 MG PO TABS: 25.0000 mg | ORAL_TABLET | Freq: Three times a day (TID) | ORAL | Status: DC | PRN

## 2014-11-16 MED ORDER — MELOXICAM 7.5 MG PO TABS: 7.5000 mg | ORAL_TABLET | Freq: Every day | ORAL | Status: DC

## 2014-11-16 NOTE — Progress Notes (Signed)
Patient ID: Adrian [name], male   DOB: 15-Jan-1976, 39 y.o.   MRN: [id number]   Adrian [name], is a 39 y.o. male  Adrian [name]:[id number]  Adrian [name]:[id number]  DOB - 22-Mar-1976  CC:  Chief Complaint  Patient presents with  . New patient    Bilateral hand pain        HPI: Adrian [name] is a 39 y.o. male here today to establish medical care and follow-up of hand numbness which was diagnosed in the ED as probable bilateral carpal tunnel symdrome. He was instructed to wear splints at night. He has had hand and wrist pain and numbness of the hands and fingers for a couple of months.  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 He has a history of Asthma which sounds mild intermittent. He reports a heart murmur. He complains of right knee and thigh pain intermittently. He has occassional episodes of dizziness and request a refill of meclizine. He also has a history of MHA for which he uses Imitrex and a history of ecezma.   Allergies  Allergen Reactions  . Peanut-Containing Drug Products Anaphylaxis, Hives, Nausea And Vomiting and Swelling  . Shellfish Allergy Anaphylaxis, Hives, Swelling and Other (See Comments)    "all seafood'  . Tomato Anaphylaxis, Hives and Swelling  . Latex Itching   Past Medical History  Diagnosis Date  . Asthma   . Eczema   . Migraine   . Foot fracture, left   . Multiple food allergies     fish, tomatoes, peanuts, eggs, chicken and others  . Environmental allergies     grass  . Heart murmur    Current Outpatient Prescriptions on File Prior to Visit  Medication Sig Dispense Refill  . albuterol (PROVENTIL HFA;VENTOLIN HFA) 108 (90 BASE) MCG/ACT inhaler Inhale 1-2 puffs into the lungs every 6 (six) hours as needed for wheezing or shortness of breath. 1 Inhaler 0  . meloxicam (MOBIC) 7.5 MG tablet Take 1 tablet (7.5 mg total) by mouth daily. 60 tablet 0  . Spacer/Aero-Holding Chambers (AEROCHAMBER MAX [employer]) MISC 1 each by Other route  once. 1 each 3  . traMADol (ULTRAM) 50 MG tablet Take 1 tablet (50 mg total) by mouth every 6 (six) hours as needed. 15 tablet 0  . benzonatate (TESSALON) 100 MG capsule Take 2 capsules (200 mg total) by mouth 2 (two) times daily as needed for cough. (Patient not taking: Reported on 06/23/2014) 20 capsule 0  . erythromycin ophthalmic ointment Place a 1/2 inch ribbon of ointment into the lower eyelid every 4 hours for 5 days (Patient not taking: Reported on 11/13/2014) 1 g 0  . meclizine (ANTIVERT) 25 MG tablet Take 1 tablet (25 mg total) by mouth 3 (three) times daily as needed for dizziness. (Patient not taking: Reported on 11/13/2014) 30 tablet 2  . predniSONE (DELTASONE) 20 MG tablet 2 tabs PO x 4 days (Patient not taking: Reported on 11/16/2014) 8 tablet 0  . SUMAtriptan (IMITREX) 50 MG tablet Take 1 tablet (50 mg total) by mouth every 2 (two) hours as needed for migraine or headache. May repeat in 2 hours if headache persists or recurs. (Patient not taking: Reported on 11/13/2014) 20 tablet 0  . triamcinolone ointment (KENALOG) 0.5 % Apply 1 application topically 2 (two) times daily. (Patient not taking: Reported on 11/16/2014) 30 g 0   No current facility-administered medications on file prior to visit.   Family History  Problem Relation Age of Onset  .  Asthma Father   . Clotting disorder      Aunt  . Breast cancer      Grandmother  . Prostate cancer      uncle  . Diabetes Mother   . Hypertension Mother   . Hyperlipidemia      grandparent  . Hypertension      grandparent/grandparent  . Kidney disease      aunt/uncle/grandparent  . Stroke      grandparent   History   Social History  . Marital Status: Single    Spouse Name: N/A  . Number of Children: N/A  . Years of Education: N/A   Occupational History  . Not on file.   Social History Main Topics  . Smoking status: Former Smoker    Types: Cigars    Quit date: 09/13/2012  . Smokeless tobacco: Not on file  . Alcohol Use:  No  . Drug Use: No  . Sexual Activity: Not on file   Other Topics Concern  . Not on file   Social History Narrative   Currently unemployed   1 son   Incarcerated on drug charges in 2010 for ~1 year.     Review of Systems: Constitutional: Negative for fever, chills, appetite change, weight loss,  fatigue. HENT: Negative for ear pain, ear discharge.nose bleeds. Positive for stuffy nose probably related to allergies Eyes: Negative for pain, discharge, redness, itching and visual disturbance. Neck: Negative for pain, stiffness Respiratory: Negative for cough, shortness of breath, Only has respiratory symptoms with asthma flares which are only occassional Cardiovascular: Negative for chest pain, palpitations and leg swelling. Gastrointestinal: Negative for abdominal distention, abdominal pain, nausea, vomiting, diarrhea, constipations Genitourinary: Negative for dysuria, urgency, frequency, hematuria, flank pain,  Musculoskeletal: Negative for back pain, joint  swelling, arthralgia and gait problem.Negative for weakness. Positive for right knee and thigh pain intermittently Neurological: Negative for dizziness, tremors, seizures, syncope, Positive for occassional  light-headedness, numbness and headaches.  Hematological: Negative for easy bruising or bleeding Psychiatric/Behavioral: Negative for depression, anxiety,  Confusion.  Positive for decreased concentration at times.  Objective:   Filed Vitals:   11/16/14 1033  BP: 132/83  Pulse: 43    Physical Exam: Constitutional: Patient appears well-developed and well-nourished. No distress. HENT: Normocephalic, atraumatic, External right and left ear normal. Oropharynx is clear and moist.  Eyes: Conjunctivae and EOM are normal. PERRLA, no scleral icterus. Neck: Normal ROM. Neck supple. No lymphadenopathy, No thyromegaly. CVS: RRR, S1/S2 +, no murmurs, no gallops, no rubs. Bradycardia present Pulmonary: Effort and breath sounds normal,  no stridor, rhonchi, wheezes, rales.  Abdominal: Soft. Normoactive BS,, no distension, tenderness, rebound or guarding.  Musculoskeletal:. No edema and no tenderness. There is pain with active and passite ROM of wrist and hands. Tinels negative. He has good equal strength of hands and arms. Neuro: Alert.Normal muscle tone coordination. Non-focal Skin: Skin is warm and dry. No rash noted. Not diaphoretic. No erythema. No pallor. Psychiatric: Normal mood and affect. Behavior, judgment, thought content normal.  Lab Results  Component Value Date   WBC 6.0 08/12/2014   HGB 13.9 11/13/2014   HCT 41.0 11/13/2014   MCV 82.1 08/12/2014   PLT 229 08/12/2014   Lab Results  Component Value Date   CREATININE 1.10 11/13/2014   BUN 7 11/13/2014   NA 141 11/13/2014   K 4.0 11/13/2014   CL 102 11/13/2014   CO2 24 08/12/2014    No results found for: HGBA1C Lipid Panel  No results found  for: CHOL, TRIG, HDL, CHOLHDL, VLDL, LDLCALC     Assessment:  Wrist pain with hand numbness, probable carpal tunnel symdrome  Plan: Continue with wrist splints and other measures given in ED Prednisone 20 mg # 8 to be taken 2 a day for 4 days Meloxicam 15mg  #30 to be taken one a day after completing the prednisone Follow-up if symptoms worsen or do not resolve Follow-up with assigned PCP in one month.  Assessment:   Intermittent dizziness Plan: Refill of meclizine.      No Follow-up on file.  The patient was given clear instructions to go to ER or return to medical center if symptoms don't improve, worsen or new problems develop. The patient verbalized understanding. The patient was told to call to get lab results if they haven't heard anything in the next week.     This note has been created with Surveyor, quantity. Any transcriptional errors are unintentional.    Micheline Chapman, MSN, FNP-BC [place], [place]   11/16/2014, 10:34 AM

## 2014-11-16 NOTE — Patient Instructions (Addendum)
Wear splints as much as possible Take prednisone and finish before starting Meloxicam Put wrists through range of motions several times a day. Follow-up with assigned primary doctor here in one month Will call with lab results in a few days. Follow-up here or in ED for extreme dizziness.

## 2014-11-16 NOTE — Progress Notes (Signed)
New patient is here to discuss hand pain.

## 2014-11-17 LAB — ANA: ANA: NEGATIVE

## 2014-11-17 LAB — SEDIMENTATION RATE: SED RATE: 1 mm/h (ref 0–15)

## 2014-11-22 ENCOUNTER — Emergency Department (HOSPITAL_COMMUNITY): Payer: Self-pay

## 2014-11-22 ENCOUNTER — Encounter (HOSPITAL_COMMUNITY): Payer: Self-pay | Admitting: Emergency Medicine

## 2014-11-22 ENCOUNTER — Emergency Department (HOSPITAL_COMMUNITY)
Admission: EM | Admit: 2014-11-22 | Discharge: 2014-11-22 | Disposition: A | Discharge status: Home / Self Care | Payer: Self-pay | Attending: Emergency Medicine | Admitting: Emergency Medicine

## 2014-11-22 DIAGNOSIS — Z79899 Other long term (current) drug therapy: Secondary | ICD-10-CM | POA: Insufficient documentation

## 2014-11-22 DIAGNOSIS — Z8781 Personal history of (healed) traumatic fracture: Secondary | ICD-10-CM | POA: Insufficient documentation

## 2014-11-22 DIAGNOSIS — Z87891 Personal history of nicotine dependence: Secondary | ICD-10-CM | POA: Insufficient documentation

## 2014-11-22 DIAGNOSIS — G43909 Migraine, unspecified, not intractable, without status migrainosus: Secondary | ICD-10-CM | POA: Insufficient documentation

## 2014-11-22 DIAGNOSIS — Z91040 Latex allergy status: Secondary | ICD-10-CM | POA: Insufficient documentation

## 2014-11-22 DIAGNOSIS — M654 Radial styloid tenosynovitis [de Quervain]: Secondary | ICD-10-CM | POA: Insufficient documentation

## 2014-11-22 DIAGNOSIS — Z872 Personal history of diseases of the skin and subcutaneous tissue: Secondary | ICD-10-CM | POA: Insufficient documentation

## 2014-11-22 DIAGNOSIS — R011 Cardiac murmur, unspecified: Secondary | ICD-10-CM | POA: Insufficient documentation

## 2014-11-22 DIAGNOSIS — J45909 Unspecified asthma, uncomplicated: Secondary | ICD-10-CM | POA: Insufficient documentation

## 2014-11-22 MED ORDER — IBUPROFEN 800 MG PO TABS: 800.0000 mg | ORAL_TABLET | Freq: Three times a day (TID) | ORAL | Status: DC

## 2014-11-22 NOTE — ED Notes (Signed)
Patient states he has R thumb pain that started x 2 wks ago.   Patient request xray.   Patient denies injury.

## 2014-11-22 NOTE — ED Provider Notes (Signed)
CSN: [id number]     Arrival date & time 11/22/14  0913 History  This chart was scribed for Hyman Bible, PA-C, working with Lacretia Leigh, MD by Girtha Hake, ED Scribe. The patient was seen in [facility]. The patient's care was started at 10:01 AM.     Chief Complaint  Patient presents with  . thumb pain    HPI HPI Comments: Adrian [name] is a 39 y.o. male with a history of carpal tunnel syndrome who presents to the Emergency Department complaining of pain at the base of his right thumb beginning 2-3 weeks ago. Patient reports that the pain is becoming progressively worse. 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 He reports that the pain is exacerbated by movement of the thumb and by lifting heavy object. Patient has not taken any medication for the pain. He denies numbness or tingling in his hands, fever, or chills.     Past Medical History  Diagnosis Date  . Asthma   . Eczema   . Migraine   . Foot fracture, left   . Multiple food allergies     fish, tomatoes, peanuts, eggs, chicken and others  . Environmental allergies     grass  . Heart murmur    Past Surgical History  Procedure Laterality Date  . Inguinal hernia repair  1994   Family History  Problem Relation Age of Onset  . Asthma Father   . Clotting disorder      Aunt  . Breast cancer      Grandmother  . Prostate cancer      uncle  . Diabetes Mother   . Hypertension Mother   . Hyperlipidemia      grandparent  . Hypertension      grandparent/grandparent  . Kidney disease      aunt/uncle/grandparent  . Stroke      grandparent   History  Substance Use Topics  . Smoking status: Former Smoker    Types: Cigars    Quit date: 09/13/2012  . Smokeless tobacco: Not on file  . Alcohol Use: No    Review of Systems  Constitutional: Negative for fever and chills.  Musculoskeletal: Positive for arthralgias.  Neurological: Negative for numbness.      Allergies   Peanut-containing drug products; Shellfish allergy; Tomato; and Latex  Home Medications   Prior to Admission medications   Medication Sig Start Date End Date Taking? Authorizing Provider  albuterol (PROVENTIL HFA;VENTOLIN HFA) 108 (90 BASE) MCG/ACT inhaler Inhale 1-2 puffs into the lungs every 6 (six) hours as needed for wheezing or shortness of breath. 04/18/14   Billy Fischer, MD  benzonatate (TESSALON) 100 MG capsule Take 2 capsules (200 mg total) by mouth 2 (two) times daily as needed for cough. Patient not taking: Reported on 06/23/2014 05/23/14   Jarrett Soho Muthersbaugh, PA-C  erythromycin ophthalmic ointment Place a 1/2 inch ribbon of ointment into the lower eyelid every 4 hours for 5 days Patient not taking: Reported on 11/13/2014 10/28/14   Jarrett Soho Muthersbaugh, PA-C  meclizine (ANTIVERT) 25 MG tablet Take 1 tablet (25 mg total) by mouth 3 (three) times daily as needed for dizziness. 11/16/14   Micheline Chapman, NP  meloxicam (MOBIC) 7.5 MG tablet Take 1 tablet (7.5 mg total) by mouth daily. 11/16/14   Micheline Chapman, NP  predniSONE (DELTASONE) 20 MG tablet 2 tabs PO x 4 days 11/16/14   Micheline Chapman, NP  Spacer/Aero-Holding Chambers (AEROCHAMBER MAX [facility])  MISC 1 each by Other route once. 01/26/12   Gerda Diss, DO  SUMAtriptan (IMITREX) 50 MG tablet Take 1 tablet (50 mg total) by mouth every 2 (two) hours as needed for migraine or headache. May repeat in 2 hours if headache persists or recurs. Patient not taking: Reported on 11/13/2014 08/16/14   Brayton Caves, PA-C  traMADol (ULTRAM) 50 MG tablet Take 1 tablet (50 mg total) by mouth every 6 (six) hours as needed. 11/16/14   Micheline Chapman, NP  triamcinolone ointment (KENALOG) 0.5 % Apply 1 application topically 2 (two) times daily. 11/16/14   Micheline Chapman, NP   Triage Vitals: BP 119/86 mmHg  Pulse 62  Temp(Src) 98.3 F (36.8 C) (Oral)  Resp 18  SpO2 96% Physical Exam  Constitutional: He is oriented to person,  place, and time. He appears well-developed and well-nourished. No distress.  HENT:  Head: Normocephalic and atraumatic.  Eyes: Conjunctivae and EOM are normal.  Neck: Neck supple. No tracheal deviation present.  Cardiovascular: Normal rate and regular rhythm.   Pulmonary/Chest: Effort normal and breath sounds normal. No respiratory distress.  Musculoskeletal: Normal range of motion. He exhibits tenderness. He exhibits no edema.  2+ radial pulse. Full ROM of the right wrist. Mild tenderness to palpation of the first metacarpal. Full ROM with opposition of the thumb. Pain with opposition of the thumb. Full ROM, with abduction, adduction, flexion, and extension of the thumb. No popping palpated. Distal sensation intact. No erythema, edema, or warmth.  Positive finkelstein's test on the right wrist.  Neurological: He is alert and oriented to person, place, and time.  Skin: Skin is warm and dry. No erythema.  Psychiatric: He has a normal mood and affect. His behavior is normal.  Nursing note and vitals reviewed.   ED Course  Procedures (including critical care time) DIAGNOSTIC STUDIES: Oxygen Saturation is 96% on room air, normal by my interpretation.    COORDINATION OF CARE:    Labs Review Labs Reviewed - No data to display  Imaging Review Dg Hand Complete Right  11/22/2014   CLINICAL DATA:  Right hand and thumb pain for 2-3 weeks, increasing. Manual labor.  EXAM: RIGHT HAND - COMPLETE 3+ VIEW  COMPARISON:  None.  FINDINGS: No acute osseous or joint abnormality.  No degenerative changes.  IMPRESSION: Negative.   Electronically Signed   By: Lorin Picket M.D.   On: 11/22/2014 09:49     EKG Interpretation None      MDM   Final diagnoses:  Tendinitis, de Quervain's   Patient presents today with pain of the base of his right thumb and the radial aspect of the right wrist.  No acute injury or trauma.  No signs of infection on exam.  Xray is negative.  Patient with a positive  Finkelstein's test.  Signs and symptoms most consistent with De Quervain's Tendinitis.  Patient neurovascularly intact.  Patient reports that he has a wrist splint at home.  Instructed patient to take NSAIDs  Patient given referral to Hand Surgery if no improvement in pain.  Patient stable for discharge.  Return precautions given.     Hyman Bible, PA-C 11/22/14 1126  Lacretia Leigh, MD 11/25/14 [phone]

## 2014-12-01 ENCOUNTER — Telehealth: Payer: Self-pay | Admitting: Family Medicine

## 2014-12-01 NOTE — Telephone Encounter (Signed)
Patient called. Patient mother answered and stated that patient was not home. Patient mother given message for patient to return call at [phone].

## 2014-12-01 NOTE — Telephone Encounter (Signed)
-----   Message from Micheline Chapman, NP sent at 12/01/2014  3:42 PM EDT ----- Labs did not show any Rheumatoid arthritis.

## 2014-12-15 ENCOUNTER — Encounter: Payer: Self-pay | Admitting: Family Medicine

## 2014-12-15 ENCOUNTER — Ambulatory Visit: Payer: Self-pay | Attending: Family Medicine | Admitting: Family Medicine

## 2014-12-15 VITALS — BP 121/74 | HR 46 | Temp 98.1°F | Resp 16 | Ht 71.0 in | Wt 202.0 lb

## 2014-12-15 DIAGNOSIS — M25531 Pain in right wrist: Secondary | ICD-10-CM | POA: Insufficient documentation

## 2014-12-15 DIAGNOSIS — Z87891 Personal history of nicotine dependence: Secondary | ICD-10-CM | POA: Insufficient documentation

## 2014-12-15 DIAGNOSIS — J4520 Mild intermittent asthma, uncomplicated: Secondary | ICD-10-CM | POA: Insufficient documentation

## 2014-12-15 DIAGNOSIS — Z114 Encounter for screening for human immunodeficiency virus [HIV]: Secondary | ICD-10-CM | POA: Insufficient documentation

## 2014-12-15 DIAGNOSIS — M25532 Pain in left wrist: Secondary | ICD-10-CM | POA: Insufficient documentation

## 2014-12-15 LAB — URIC ACID: Uric Acid, Serum: 4.3 mg/dL (ref 4.0–7.8)

## 2014-12-15 MED ORDER — MELOXICAM 15 MG PO TABS: 15.0000 mg | ORAL_TABLET | Freq: Every day | ORAL | Status: DC

## 2014-12-15 MED ORDER — CETIRIZINE HCL 10 MG PO TABS: 10.0000 mg | ORAL_TABLET | Freq: Every day | ORAL | Status: DC

## 2014-12-15 MED ORDER — ALBUTEROL SULFATE HFA 108 (90 BASE) MCG/ACT IN AERS: 2.0000 | INHALATION_SPRAY | Freq: Four times a day (QID) | RESPIRATORY_TRACT | Status: DC | PRN

## 2014-12-15 NOTE — Assessment & Plan Note (Signed)
1. Wrist pain: Osteoarthritis  Rest wrist as much as possible Ice mobic for inflammation

## 2014-12-15 NOTE — Patient Instructions (Signed)
Mr. Baccam,  Thank you for coming in today. It was a pleasure meeting you. I look forward to being your primary doctor.   1. Wrist pain: Osteoarthritis  Rest wrist as much as possible Ice mobic for inflammation   2. Asthma: Refilled albuterol Start zytec 10 mg once daily  3. Healthcare maintenance: Screening HIV ordered  F/u in 2 months for wrist pain  Dr. Adrian Blackwater

## 2014-12-15 NOTE — Assessment & Plan Note (Signed)
Healthcare maintenance: Screening HIV ordered

## 2014-12-15 NOTE — Progress Notes (Signed)
   Subjective:    Patient ID: Adrian [name], male    DOB: 03-Mar-1976, 39 y.o.   MRN: [id number] CC: establish care, b/l wrist pain, asthma  HPI  1. B/l wrist pain: x 7 months. No injury. 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 He does frequent heavy lifting and repetitive wrist hyperextension. Pain is worse in the right with mild swelling. No erythema. No other significant joint involvement or skin rash. No family history of early onset arthritis. Patient is not taking medication. He is wearing wrist splints at night.   #2 asthma: Patient reports intermittent shortness of breath and congestion. He is out of albuterol which he takes when necessary. He denies nighttime symptoms. He is not taking anti-histamine. Patient is a nonsmoker.  Social history: Nonsmoker  Review of Systems  Constitutional: Negative for fever and chills.  Respiratory: Negative for cough and shortness of breath.   Musculoskeletal: Positive for joint swelling and arthralgias. Negative for myalgias, back pain, gait problem, neck pain and neck stiffness.       Objective:   Physical Exam  Constitutional: He appears well-developed and well-nourished. No distress.  HENT:  Nose: Mucosal edema present.  R turbinated edema   Pulmonary/Chest: Effort normal and breath sounds normal.  Musculoskeletal: Normal range of motion. He exhibits tenderness. He exhibits no edema.       Right wrist: He exhibits tenderness, bony tenderness and crepitus. He exhibits normal range of motion, no swelling, no effusion, no deformity and no laceration.       Left wrist: He exhibits tenderness, bony tenderness and swelling. He exhibits normal range of motion, no effusion, no crepitus, no deformity and no laceration.  Skin: Skin is dry and intact.          Assessment & Plan:

## 2014-12-15 NOTE — Progress Notes (Signed)
Establish Care Complaining of wrist pain bilateral, worst on RT No Hx tobacco

## 2014-12-15 NOTE — Assessment & Plan Note (Signed)
A:  Asthma: mild intermittent P: Refilled albuterol Start zytec 10 mg once daily

## 2014-12-16 LAB — HIV ANTIBODY (ROUTINE TESTING W REFLEX): HIV 1&2 Ab, 4th Generation: NONREACTIVE

## 2014-12-20 NOTE — Telephone Encounter (Signed)
LVM to return call.

## 2014-12-20 NOTE — Telephone Encounter (Signed)
-----   Message from Boykin Nearing, MD sent at 12/18/2014  9:07 AM EDT ----- Screening HIV negative Uric acid normal, low risk for gout

## 2015-01-15 ENCOUNTER — Ambulatory Visit: Payer: Self-pay | Attending: Internal Medicine

## 2015-03-26 ENCOUNTER — Emergency Department (HOSPITAL_COMMUNITY)
Admission: EM | Admit: 2015-03-26 | Discharge: 2015-03-26 | Disposition: A | Discharge status: Home / Self Care | Payer: Self-pay | Attending: Emergency Medicine | Admitting: Emergency Medicine

## 2015-03-26 ENCOUNTER — Encounter (HOSPITAL_COMMUNITY): Payer: Self-pay | Admitting: Family Medicine

## 2015-03-26 DIAGNOSIS — Z79899 Other long term (current) drug therapy: Secondary | ICD-10-CM | POA: Insufficient documentation

## 2015-03-26 DIAGNOSIS — R011 Cardiac murmur, unspecified: Secondary | ICD-10-CM | POA: Insufficient documentation

## 2015-03-26 DIAGNOSIS — Z791 Long term (current) use of non-steroidal anti-inflammatories (NSAID): Secondary | ICD-10-CM | POA: Insufficient documentation

## 2015-03-26 DIAGNOSIS — J45901 Unspecified asthma with (acute) exacerbation: Secondary | ICD-10-CM | POA: Insufficient documentation

## 2015-03-26 DIAGNOSIS — J45909 Unspecified asthma, uncomplicated: Secondary | ICD-10-CM | POA: Insufficient documentation

## 2015-03-26 DIAGNOSIS — Z87891 Personal history of nicotine dependence: Secondary | ICD-10-CM | POA: Insufficient documentation

## 2015-03-26 DIAGNOSIS — Z872 Personal history of diseases of the skin and subcutaneous tissue: Secondary | ICD-10-CM | POA: Insufficient documentation

## 2015-03-26 DIAGNOSIS — Z8781 Personal history of (healed) traumatic fracture: Secondary | ICD-10-CM | POA: Insufficient documentation

## 2015-03-26 MED ORDER — ALBUTEROL SULFATE HFA 108 (90 BASE) MCG/ACT IN AERS
2.0000 | INHALATION_SPRAY | Freq: Once | RESPIRATORY_TRACT | Status: AC
  Administered 2015-03-26: 2 via RESPIRATORY_TRACT
  Filled 2015-03-26: qty 6.7

## 2015-03-26 MED ORDER — ALBUTEROL SULFATE (2.5 MG/3ML) 0.083% IN NEBU
5.0000 mg | INHALATION_SOLUTION | Freq: Once | RESPIRATORY_TRACT | Status: AC
  Administered 2015-03-26: 5 mg via RESPIRATORY_TRACT
  Filled 2015-03-26: qty 6

## 2015-03-26 MED ORDER — IPRATROPIUM-ALBUTEROL 0.5-2.5 (3) MG/3ML IN SOLN
3.0000 mL | Freq: Once | RESPIRATORY_TRACT | Status: DC
  Filled 2015-03-26: qty 3

## 2015-03-26 MED ORDER — PREDNISONE 10 MG (21) PO TBPK: 10.0000 mg | ORAL_TABLET | Freq: Every day | ORAL | Status: DC

## 2015-03-26 NOTE — ED Notes (Signed)
Declined W/C at D/C and was escorted to lobby by RN. 

## 2015-03-26 NOTE — ED Provider Notes (Signed)
CSN: [id number]     Arrival date & time 03/26/15  4650 History   First MD Initiated Contact with Patient 03/26/15 [phone]     Chief Complaint  Patient presents with  . Asthma     (Consider location/radiation/quality/duration/timing/severity/associated sxs/prior Treatment) The history is provided by the patient.     Pt with hx asthma has had one week of chest tightness, SOB, dry cough.  Has been using his son's inhaler with some improvement.  Associated nasal congestion.  Denies fevers, chills, productive cough, sore throat.  Pt had albuterol treatment prior to my seeing him and is currently asymptomatic.   Past Medical History  Diagnosis Date  . Eczema   . Migraine   . Foot fracture, left   . Multiple food allergies     fish, tomatoes, peanuts, eggs, chicken and others  . Environmental allergies     grass  . Heart murmur   . 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 Inguinal hernia repair  1994   Family History  Problem Relation Age of Onset  . Asthma Father   . Clotting disorder      Aunt  . Breast cancer      Grandmother  . Prostate cancer      uncle  . Diabetes Mother   . Hypertension Mother   . Hyperlipidemia      grandparent  . Hypertension      grandparent/grandparent  . Kidney disease      aunt/uncle/grandparent  . Stroke      grandparent   Social History  Substance Use Topics  . Smoking status: Former Smoker    Types: Cigars    Quit date: 09/13/2012  . Smokeless tobacco: None  . Alcohol Use: No    Review of Systems  All other systems reviewed and are negative.     Allergies  Atrovent; Peanut-containing drug products; Shellfish allergy; Tomato; and Latex  Home Medications   Prior to Admission medications   Medication Sig Start Date End Date Taking? Authorizing Provider  albuterol (PROVENTIL HFA;VENTOLIN HFA) 108 (90 BASE) MCG/ACT inhaler Inhale 2 puffs into the lungs every 6 (six) hours as needed for  wheezing or shortness of breath. 12/15/14   Josalyn Funches, MD  cetirizine (ZYRTEC) 10 MG tablet Take 1 tablet (10 mg total) by mouth daily. 12/15/14   Josalyn Funches, MD  meloxicam (MOBIC) 15 MG tablet Take 1 tablet (15 mg total) by mouth daily. 12/15/14   Josalyn Funches, MD  predniSONE (STERAPRED UNI-PAK 21 TAB) 10 MG (21) TBPK tablet Take 1 tablet (10 mg total) by mouth daily. Day 1: take 6 tabs.  Day 2: 5 tabs  Day 3: 4 tabs  Day 4: 3 tabs  Day 5: 2 tabs  Day 6: 1 tab 03/26/15   Clayton Bibles, PA-C  Spacer/Aero-Holding Chambers (AEROCHAMBER MAX [facility]) MISC 1 each by Other route once. Patient not taking: Reported on 11/22/2014 01/26/12   Gerda Diss, MD   BP 133/68 mmHg  Pulse 50  Temp(Src) 97.6 F (36.4 C)  Resp 18  SpO2 98% Physical Exam  Constitutional: He appears well-developed and well-nourished. No distress.  HENT:  Head: Normocephalic and atraumatic.  Neck: Neck supple.  Cardiovascular: Normal rate and regular rhythm.   Pulmonary/Chest: Effort normal and breath sounds normal. No respiratory distress. He has no wheezes. He has no rales.  Neurological: He is alert.  Skin: He is not diaphoretic.  Psychiatric:  He has a normal mood and affect. His behavior is normal.  Nursing note and vitals reviewed.   ED Course  Procedures (including critical care time) Labs Review Labs Reviewed - No data to display  Imaging Review No results found. I have personally reviewed and evaluated these images and lab results as part of my medical decision-making.   EKG Interpretation None      MDM   Final diagnoses:  Asthma exacerbation    Afebrile, nontoxic patient with mild URI and asthma exacerbation, completely improved and asymptomatic upon my assessment.   D/C home with prednisone, albuterol. PCP follow up.  Discussed result, findings, treatment, and follow up  with patient.  Pt given return precautions.  Pt verbalizes understanding and agrees with plan.         Clayton Bibles, PA-C 03/26/15 1627  Evelina Bucy, MD 03/27/15 1255

## 2015-03-26 NOTE — ED Notes (Signed)
TC to PT to report [facility]

## 2015-03-26 NOTE — Discharge Instructions (Signed)
Read the information below.  Use the prescribed medication as directed.  Please discuss all new medications with your pharmacist.  You may return to the Emergency Department at any time for worsening condition or any new symptoms that concern you.  If you develop worsening shortness of breath, uncontrolled wheezing, severe chest pain, or fevers despite using tylenol and/or ibuprofen, return for a recheck.     ° ° °Asthma °Asthma is a condition of the lungs in which the airways tighten and narrow. Asthma can make it hard to breathe. Asthma cannot be cured, but medicine and lifestyle changes can help control it. Asthma may be started (triggered) by: °· Animal skin flakes (dander). °· Dust. °· Cockroaches. °· Pollen. °· Mold. °· Smoke. °· Cleaning products. °· Hair sprays or aerosol sprays. °· Paint fumes or strong smells. °· Cold air, weather changes, and winds. °· Crying or laughing hard. °· Stress. °· Certain medicines or drugs. °· Foods, such as dried fruit, potato chips, and sparkling grape juice. °· Infections or conditions (colds, flu). °· Exercise. °· Certain medical conditions or diseases. °· Exercise or tiring activities. °HOME CARE  °· Take medicine as told by your doctor. °· Use a peak flow meter as told by your doctor. A peak flow meter is a tool that measures how well the lungs are working. °· Record and keep track of the peak flow meter's readings. °· Understand and use the asthma action plan. An asthma action plan is a written plan for taking care of your asthma and treating your attacks. °· To help prevent asthma attacks: °¨ Do not smoke. Stay away from secondhand smoke. °¨ Change your heating and air conditioning filter often. °¨ Limit your use of fireplaces and wood stoves. °¨ Get rid of pests (such as roaches and mice) and their droppings. °¨ Throw away plants if you see mold on them. °¨ Clean your floors. Dust regularly. Use cleaning products that do not smell. °¨ Have someone vacuum when you are  not home. Use a vacuum cleaner with a HEPA filter if possible. °¨ Replace carpet with wood, tile, or vinyl flooring. Carpet can trap animal skin flakes and dust. °¨ Use allergy-proof pillows, mattress covers, and box spring covers. °¨ Wash bed sheets and blankets every week in hot water and dry them in a dryer. °¨ Use blankets that are made of polyester or cotton. °¨ Clean bathrooms and kitchens with bleach. If possible, have someone repaint the walls in these rooms with mold-resistant paint. Keep out of the rooms that are being cleaned and painted. °¨ Wash hands often. °GET HELP IF: °· You have make a whistling sound when breaking (wheeze), have shortness of breath, or have a cough even if taking medicine to prevent attacks. °· The colored mucus you cough up (sputum) is thicker than usual. °· The colored mucus you cough up changes from clear or white to yellow, green, gray, or bloody. °· You have problems from the medicine you are taking such as: °¨ A rash. °¨ Itching. °¨ Swelling. °¨ Trouble breathing. °· You need reliever medicines more than 2-3 times a week. °· Your peak flow measurement is still at 50-79% of your personal best after following the action plan for 1 hour. °· You have a fever. °GET HELP RIGHT AWAY IF:  °· You seem to be worse and are not responding to medicine during an asthma attack. °· You are short of breath even at rest. °· You get short of breath when doing   very little activity. °· You have trouble eating, drinking, or talking. °· You have chest pain. °· You have a fast heartbeat. °· Your lips or fingernails start to turn blue. °· You are light-headed, dizzy, or faint. °· Your peak flow is less than 50% of your personal best. °MAKE SURE YOU:  °· Understand these instructions. °· Will watch your condition. °· Will get help right away if you are not doing well or get worse. °Document Released: 12/03/2007 Document Revised: 10/31/2013 Document Reviewed: 01/13/2013 °ExitCare® Patient Information  ©2015 [employer]. This information is not intended to replace advice given to you by your health care provider. Make sure you discuss any questions you have with your health care provider. ° ° ° °Emergency Department Resource Guide °1) Find a Doctor and Pay Out of Pocket °Although you won't have to find out who is covered by your insurance plan, it is a good idea to ask around and get recommendations. You will then need to call the office and see if the doctor you have chosen will accept you as a new patient and what types of options they offer for patients who are self-pay. Some doctors offer discounts or will set up payment plans for their patients who do not have insurance, but you will need to ask so you aren't surprised when you get to your appointment. ° °2) Contact Your Local Health Department °Not all health departments have doctors that can see patients for sick visits, but many do, so it is worth a call to see if yours does. If you don't know where your local health department is, you can check in your phone book. The CDC also has a tool to help you locate your state's health department, and many state websites also have listings of all of their local health departments. ° °3) Find a Walk-in Clinic °If your illness is not likely to be very severe or complicated, you may want to try a walk in clinic. These are popping up all over the country in pharmacies, drugstores, and shopping centers. They're usually staffed by nurse practitioners or physician assistants that have been trained to treat common illnesses and complaints. They're usually fairly quick and inexpensive. However, if you have serious medical issues or chronic medical problems, these are probably not your best option. ° °No Primary Care Doctor: °- Call [facility] at  832-8000 - they can help you locate a primary care doctor that  accepts your insurance, provides certain services, etc. °- Physician Referral Service-  1-800-533-3463 ° °Chronic Pain Problems: °Organization         Address  Phone   Notes  °[facility]  (336) 297-2271 Patients need to be referred by their primary care doctor.  ° °Medication Assistance: °Organization         Address  Phone   Notes  °[place] [facility] [address] °[place], [place] [address] (336) 641-8030 --Must be a resident of [place] °-- Must have NO insurance coverage whatsoever (no Medicaid/ Medicare, etc.) °-- The pt. MUST have a primary care doctor that directs their care regularly and follows them in the community °  °[facility]  (866) 331-1348   °[employer]  (888) 892-1162   ° °Agencies that provide inexpensive medical care: °Organization         Address  Phone   Notes  °[facility]  (336) 832-8035   °[facility]    (336)   832-7272   °[facility] [address] °[place], [place] [address] (336) 832-4777   °[facility] [address], °[place] (336) 271-4999   °[facility]    (336) 373-0678   °[facility]    (336) 272-1050   °[facility] ° [address], [place] Phone:  (336) 832-4444, Fax:  (336) 832-4440 Hours of Operation:  9 am - 6 pm, M-F.  Also accepts Medicaid/Medicare and self-pay.  °[facility] ° [address], [place] Phone: (336) 832-3150, Fax: (336) 832-3151. Hours of Operation:  8:30 am - 5:30 pm, M-F.  Also accepts Medicaid and self-pay.  °[facility] [place] [address], [place] Phone: (336) 878-6027   °[facility] [address], [place], [place] (336)723-1848, Ext. 123 Mondays & Thursdays: 7-9 AM.  First 15 patients are seen on a first come, first serve basis. °  ° °Medicaid-accepting [place] Providers: ° °Organization         Address  Phone   Notes  °[facility] [address],  [place] (336) 641-2100 Also accepts self-pay patients.  °[facility] [address], [place] ° (336) 856-9996   °[facility] [address], [place] (336) 288-8857   °[facility] [address] [place] [address], [place] (336) 299-7000   °Veita Bland [address], [place]  ° (336) 373-1557 Only accepts [place] Access Medicaid patients after they have their name applied to their card.  ° °Self-Pay (no insurance) in [place]: ° °Organization         Address  Phone   Notes  °Sickle Cell Patients, [facility] [address], [place] (336) 832-1970   °[facility] [address], [place] (336) 832-4400   °[facility] ° [address], [place] (336) 992-4800   °[facility]/Dr. Osei-Bonsu ° [address] [place] [address], [place] or [address], [place] (336) 841-8500 Phone number for both [place] and [place] locations is the same.  °[facility] [address], [place] (336) 299-0000   °[facility] [address] [place] [address], [place] or [address] (336) 852-7530 °(336) 878-2260   °[facility] [address], [place] (336) 350-1642, phone; (336) 294-5005, fax Sees patients 1st and 3rd Saturday of every month.  Must not qualify for public or private insurance (i.e. Medicaid, Medicare, [place] Health Choice, Veterans' Benefits) • Household income should be no more than 200% of the poverty level •The clinic cannot treat you if you are pregnant or think you are pregnant • Sexually transmitted diseases are not treated at the clinic.  ° ° °Dental Care: °Organization         Address  Phone  Notes  °[facility] [facility] [address],  (336) 641-6152 Accepts children up to age 21 who are enrolled in  Medicaid or [place] Health Choice; pregnant women with a Medicaid card; and children who have applied for Medicaid or [place] Health Choice, but were declined, whose parents can pay a reduced fee at time of service.  °[facility] [place]  [address], [place] (336) 641-7733 Accepts children up to   age 21 who are enrolled in Medicaid or [place] Health Choice; pregnant women with a Medicaid card; and children who have applied for Medicaid or [place] Health Choice, but were declined, whose parents can pay a reduced fee at time of service.  °[facility] ° [address], [place] (336) 641-4533 Patients are seen by appointment only. Walk-ins are not accepted. [facility] will see patients 18 years of age and older. °Monday - Tuesday (8am-5pm) °Most Wednesdays (8:30-5pm) °$30 per visit, cash only  °[facility] ° [address], [place] (336) 641-4533 Patients are seen by appointment only. Walk-ins are not accepted. [facility] will see patients 18 years of age and older. °One Wednesday Evening (Monthly: Volunteer Based).  $30 per visit, cash only  °[facility]  (919) 537-3737 for adults; Children under age 4, call Graduate Pediatric Dentistry at (919) [phone]. Children aged 4-14, please call (919) [phone] to request a pediatric application. ° Dental services are provided in all areas of dental care including fillings, crowns and bridges, complete and partial dentures, implants, gum treatment, root canals, and extractions. Preventive care is also provided. Treatment is provided to both adults and children. °Patients are selected via a lottery and there is often a waiting list. °  °[facility] [address], °[place] ° (336) 763-8833 [email or web address] °  °[facility] [address], [place], [place] (336)723-1848, Ext. 123 Second and Fourth Thursday of each month, opens at 6:30  AM; Clinic ends at 9 AM.  Patients are seen on a first-come first-served basis, and a limited number are seen during each clinic.  ° °[facility] ° [address], [place], [place] (336) 723-7904   Eligibility Requirements °You must have lived in [place], [place], or [place] counties for at least the last three months. °  You cannot be eligible for state or federal sponsored healthcare insurance, including Veterans Administration, Medicaid, or Medicare. °  You generally cannot be eligible for healthcare insurance through your employer.  °  How to apply: °Eligibility screenings are held every Tuesday and Wednesday afternoon from 1:00 pm until 4:00 pm. You do not need an appointment for the interview!  °[facility] [address], [place], [place] 336-631-2330   °[facility]  336-342-8273   °[facility]  336-703-3100   °[facility]  336-570-6415   ° °Behavioral Health Resources in the Community: °Intensive Outpatient Programs °Organization         Address  Phone  Notes  °[facility] [address], [place], [place] 336-878-6098   °[facility] [address], [place], [place] 336-832-9800   °[facility] [address], [place], [place] ° 336-882-2125   °[facility] [address],  °[place], [place] 1-800-853-5163 or 336-641-4981   °Substance Abuse Resources °Organization         Address  Phone  Notes  °[facility]  336-882-2125   °[facility]  336-784-9470   °[facility]  336-285-9073   °[facility]  336-845-3988   °Residential & Outpatient Substance Abuse Program  1-800-659-3381   °Psychological Services °Organization         Address  Phone  Notes  °[facility]  336- 832-9600   °[facility]  336- 378-7881   °[facility] [address], [place] 1-800-853-5163 or  336-641-4981   ° °Mobile Crisis Teams °Organization         Address  Phone  Notes  °[facility], Mobile Crisis Care Unit  1-877-626-1772   °Assertive °[facility] ° [address] [place], [place] 336-834-9664   °Sharon DeEsch [address] °[place] 336-554-5454   ° °Self-Help/Support Groups °Organization         Address  Phone             Notes  °[facility] - variety of support groups  336- 373-1402 Call for more information  °Narcotics Anonymous (NA), [facility] [address], °[place]  2 meetings at this location  ° °Residential Treatment Programs °Organization         Address  Phone  Notes  °[facility] [address],    °[place]  1-866-801-8205   °[facility] ° [address], [place], [place] 704-293-8524   °[facility] [address], [place] 336-845-3988 Admissions: 8am-3pm M-F  °[facility] [address]    °[place], [place] 336-841-1104   °[facility] [address], [place], [place] 336-379-7146   °[facility] [address]  °[place], [place] 336-285-9073   °[facility] - Intensive Outpatient [address], [place], [place] 336-852-3033   °[facility] [address]  °[place], [place] 1-877-615-2722 or 336-784-9470   °[facility] [address] [place], [place] 336-227-7417 Accepts Medicaid  °[facility] [address]  °[place] 1-800-659-3381 Substance Abuse/Addiction Treatment  ° °[place] Behavioral Health Resources °Organization         Address  Phone  Notes  °[facility]  (888) 581-9988   °Julie Brannon, PhD [address] [place], [place]   (336) 349-5553 or (336) 951-0000   °[facility]   [address] °[place], [place] (336) 349-4454   °[facility] [address],  [place], [place] (336) 342-8316 Insurance/Medicaid/sponsorship through [facility]  °[facility] [address] [place] [address]                                    [place], [place] (336) 342-8316 Therapy/tele-psych/case  °[facility] [address]  ° [place], [place] (336) 349-2233    °Dr. Arfeen  (336) 349-4544   °[facility] [place]  [employer] [place] [facility] 1) [address], [place] °2) [address], [place] °3)  [address], [place] (336) 349-3220 °(336) 342-7768 ° °(336) 342-8140   °[place] [facility] (336) 342-1394 or (336) 342-3537 (After Hours)    ° ° ° °

## 2015-03-26 NOTE — ED Notes (Signed)
Pt here for asthma flare up. sts x 1 week and he has been using his sons inhaler with relief.

## 2015-04-03 ENCOUNTER — Emergency Department (HOSPITAL_COMMUNITY)
Admission: EM | Admit: 2015-04-03 | Discharge: 2015-04-03 | Disposition: A | Discharge status: Home / Self Care | Payer: Self-pay | Attending: Emergency Medicine | Admitting: Emergency Medicine

## 2015-04-03 ENCOUNTER — Encounter (HOSPITAL_COMMUNITY): Payer: Self-pay | Admitting: Emergency Medicine

## 2015-04-03 ENCOUNTER — Emergency Department (HOSPITAL_COMMUNITY): Payer: Self-pay

## 2015-04-03 DIAGNOSIS — S6992XA Unspecified injury of left wrist, hand and finger(s), initial encounter: Secondary | ICD-10-CM | POA: Insufficient documentation

## 2015-04-03 DIAGNOSIS — Z79899 Other long term (current) drug therapy: Secondary | ICD-10-CM | POA: Insufficient documentation

## 2015-04-03 DIAGNOSIS — X58XXXA Exposure to other specified factors, initial encounter: Secondary | ICD-10-CM | POA: Insufficient documentation

## 2015-04-03 DIAGNOSIS — Z87891 Personal history of nicotine dependence: Secondary | ICD-10-CM | POA: Insufficient documentation

## 2015-04-03 DIAGNOSIS — Z872 Personal history of diseases of the skin and subcutaneous tissue: Secondary | ICD-10-CM | POA: Insufficient documentation

## 2015-04-03 DIAGNOSIS — Z91040 Latex allergy status: Secondary | ICD-10-CM | POA: Insufficient documentation

## 2015-04-03 DIAGNOSIS — Y9289 Other specified places as the place of occurrence of the external cause: Secondary | ICD-10-CM | POA: Insufficient documentation

## 2015-04-03 DIAGNOSIS — Y998 Other external cause status: Secondary | ICD-10-CM | POA: Insufficient documentation

## 2015-04-03 DIAGNOSIS — Y9389 Activity, other specified: Secondary | ICD-10-CM | POA: Insufficient documentation

## 2015-04-03 DIAGNOSIS — M79645 Pain in left finger(s): Secondary | ICD-10-CM

## 2015-04-03 DIAGNOSIS — R011 Cardiac murmur, unspecified: Secondary | ICD-10-CM | POA: Insufficient documentation

## 2015-04-03 DIAGNOSIS — J45909 Unspecified asthma, uncomplicated: Secondary | ICD-10-CM | POA: Insufficient documentation

## 2015-04-03 MED ORDER — IBUPROFEN 400 MG PO TABS
400.0000 mg | ORAL_TABLET | Freq: Once | ORAL | Status: AC
  Administered 2015-04-03: 400 mg via ORAL
  Filled 2015-04-03: qty 1

## 2015-04-03 MED ORDER — IBUPROFEN 400 MG PO TABS: 400.0000 mg | ORAL_TABLET | Freq: Four times a day (QID) | ORAL | Status: DC | PRN

## 2015-04-03 NOTE — Discharge Instructions (Signed)
Your evaluated in the ED today and there is not appear to be an emergent cause for your symptoms at this time. Ear exam was reassuring and her x-ray showed no can bones or dislocations. Please take Motrin as prescribed to help with your discomfort and inflammation. Follow up with your doctor as needed for reevaluation. Return to ED for any new or worsening symptoms.  Musculoskeletal Pain Musculoskeletal pain is muscle and boney aches and pains. These pains can occur in any part of the body. Your caregiver may treat you without knowing the cause of the pain. They may treat you if blood or urine tests, X-rays, and other tests were normal.  CAUSES There is often not a definite cause or reason for these pains. These pains may be caused by a type of germ (virus). The discomfort may also come from overuse. Overuse includes working out too hard when your body is not fit. Boney aches also come from weather changes. Bone is sensitive to atmospheric pressure changes. HOME CARE INSTRUCTIONS   Ask when your test results will be ready. Make sure you get your test results.  Only take over-the-counter or prescription medicines for pain, discomfort, or fever as directed by your caregiver. If you were given medications for your condition, do not drive, operate machinery or power tools, or sign legal documents for 24 hours. Do not drink alcohol. Do not take sleeping pills or other medications that may interfere with treatment.  Continue all activities unless the activities cause more pain. When the pain lessens, slowly resume normal activities. Gradually increase the intensity and duration of the activities or exercise.  During periods of severe pain, bed rest may be helpful. Lay or sit in any position that is comfortable.  Putting ice on the injured area.  Put ice in a bag.  Place a towel between your skin and the bag.  Leave the ice on for 15 to 20 minutes, 3 to 4 times a day.  Follow up with your caregiver  for continued problems and no reason can be found for the pain. If the pain becomes worse or does not go away, it may be necessary to repeat tests or do additional testing. Your caregiver may need to look further for a possible cause. SEEK IMMEDIATE MEDICAL CARE IF:  You have pain that is getting worse and is not relieved by medications.  You develop chest pain that is associated with shortness or breath, sweating, feeling sick to your stomach (nauseous), or throw up (vomit).  Your pain becomes localized to the abdomen.  You develop any new symptoms that seem different or that concern you. MAKE SURE YOU:   Understand these instructions.  Will watch your condition.  Will get help right away if you are not doing well or get worse. Document Released: 06/16/2005 Document Revised: 09/08/2011 Document Reviewed: 02/18/2013 [facility] Patient Information 2015 [place], [place]. This information is not intended to replace advice given to you by your health care provider. Make sure you discuss any questions you have with your health care provider.

## 2015-04-03 NOTE — ED Notes (Signed)
Shut left 5th finger in car door this am. C/o pain and swelling to distal joint.

## 2015-04-03 NOTE — ED Provider Notes (Signed)
CSN: [id number]     Arrival date & time 04/03/15  1012 History  By signing my name below, I, Evelene Croon, attest that this documentation has been prepared under the direction and in the presence of non-physician practitioner, Comer Locket, PA-C. Electronically Signed: Evelene Croon, Scribe. 04/03/2015. 10:46 AM.      Chief Complaint  Patient presents with  . Finger Injury   The history is provided by the patient. No language interpreter was used.    HPI Comments:  Adrian [name] is a 39 y.o. male who presents to the Emergency Department complaining of sudden onset, constant pain and swelling to his left pinky finger following injury ~0800 this AM. Pt states he jammed the finger in his car door. He rates his pain a 8/10 at this time. His pain is exacerbated with movement of the finger. Nothing tried to improve symptoms. Denies any numbness or weakness or open wounds. No alleviating factors or associated symptoms noted.   Past Medical History  Diagnosis Date  . Eczema   . Migraine   . Foot fracture, left   . Multiple food allergies     fish, tomatoes, peanuts, eggs, chicken and others  . Environmental allergies     grass  . Heart murmur   . 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 Inguinal hernia repair  1994   Family History  Problem Relation Age of Onset  . Asthma Father   . Clotting disorder      Aunt  . Breast cancer      Grandmother  . Prostate cancer      uncle  . Diabetes Mother   . Hypertension Mother   . Hyperlipidemia      grandparent  . Hypertension      grandparent/grandparent  . Kidney disease      aunt/uncle/grandparent  . Stroke      grandparent   Social History  Substance Use Topics  . Smoking status: Former Smoker    Types: Cigars    Quit date: 09/13/2012  . Smokeless tobacco: None  . Alcohol Use: No    Review of Systems  Musculoskeletal: Positive for myalgias and joint swelling.  All other systems  reviewed and are negative.     Allergies  Atrovent; Peanut-containing drug products; Shellfish allergy; Tomato; and Latex  Home Medications   Prior to Admission medications   Medication Sig Start Date End Date Taking? Authorizing Provider  albuterol (PROVENTIL HFA;VENTOLIN HFA) 108 (90 BASE) MCG/ACT inhaler Inhale 2 puffs into the lungs every 6 (six) hours as needed for wheezing or shortness of breath. 12/15/14   Josalyn Funches, MD  cetirizine (ZYRTEC) 10 MG tablet Take 1 tablet (10 mg total) by mouth daily. 12/15/14   Josalyn Funches, MD  ibuprofen (ADVIL,MOTRIN) 400 MG tablet Take 1 tablet (400 mg total) by mouth every 6 (six) hours as needed. 04/03/15   Comer Locket, PA-C  meloxicam (MOBIC) 15 MG tablet Take 1 tablet (15 mg total) by mouth daily. 12/15/14   Josalyn Funches, MD  predniSONE (STERAPRED UNI-PAK 21 TAB) 10 MG (21) TBPK tablet Take 1 tablet (10 mg total) by mouth daily. Day 1: take 6 tabs.  Day 2: 5 tabs  Day 3: 4 tabs  Day 4: 3 tabs  Day 5: 2 tabs  Day 6: 1 tab 03/26/15   Clayton Bibles, PA-C  Spacer/Aero-Holding Chambers (AEROCHAMBER MAX [facility]) MISC 1 each by Other route once. Patient not  taking: Reported on 11/22/2014 01/26/12   Gerda Diss, MD   BP 116/66 mmHg  Pulse 50  Temp(Src) 98.1 F (36.7 C) (Oral)  Resp 18  Ht 5\' 11"  (1.803 m)  Wt 205 lb (92.987 kg)  BMI 28.60 kg/m2  SpO2 100% Physical Exam  Constitutional: He is oriented to person, place, and time. He appears well-developed and well-nourished.  Awake, alert, nontoxic appearance. African-American male  HENT:  Head: Normocephalic and atraumatic.  Eyes: Right eye exhibits no discharge. Left eye exhibits no discharge.  Neck: Neck supple.  Cardiovascular: Normal rate, regular rhythm and normal heart sounds.   Pulmonary/Chest: Effort normal. He exhibits no tenderness.  Abdominal: Soft. He exhibits no distension. There is no tenderness. There is no rebound.  Musculoskeletal: He exhibits no tenderness.   Baseline ROM, no obvious new focal weakness. Moderate tenderness palpation over the pad of left distal fifth digit. Small amount of ecchymosis. No subungual hematoma. Full active range of motion at all joints. No tenderness over joints. Distal pulses intact. Sensation intact.  Neurological: He is alert and oriented to person, place, and time.  Mental status and motor strength appears baseline for patient and situation.  Skin: Skin is warm and dry. No rash noted.  Psychiatric: He has a normal mood and affect.  Nursing note and vitals reviewed.   ED Course  Procedures   DIAGNOSTIC STUDIES:  Oxygen Saturation is 98% on RA, normal by my interpretation.    COORDINATION OF CARE:  10:43 AM Will order XR and motrin. Advised pt to apply ice. Discussed treatment plan with pt at bedside and pt agreed to plan.  Labs Review Labs Reviewed - No data to display  Imaging Review Dg Finger Little Left  04/03/2015   CLINICAL DATA:  Closed little finger in car door.  Swelling.  EXAM: LEFT LITTLE FINGER 2+V  COMPARISON:  None.  FINDINGS: There is no evidence of fracture or dislocation. There is no evidence of arthropathy or other focal bone abnormality. Soft tissues are unremarkable.  IMPRESSION: Negative.   Electronically Signed   By: Rolm Baptise M.D.   On: 04/03/2015 11:36   I have personally reviewed and evaluated these images  as part of my medical decision-making.   EKG Interpretation None     Meds given in ED:  Medications  ibuprofen (ADVIL,MOTRIN) tablet 400 mg (400 mg Oral Given 04/03/15 1050)    Discharge Medication List as of 04/03/2015 12:04 PM    START taking these medications   Details  ibuprofen (ADVIL,MOTRIN) 400 MG tablet Take 1 tablet (400 mg total) by mouth every 6 (six) hours as needed., Starting 04/03/2015, Until Discontinued, Print       Filed Vitals:   04/03/15 1019 04/03/15 1208  BP: 132/62 116/66  Pulse: 55 50  Temp: 98.1 F (36.7 C)   TempSrc: Oral   Resp: 18  18  Height: 5\' 11"  (1.803 m)   Weight: 205 lb (92.987 kg)   SpO2: 98% 100%    MDM  Vitals stable - WNL -afebrile Pt resting comfortably in ED. PE--patient maintains full active range of motion of the affected hand, specifically left fifth digit. No wounds or deformities noted. Imaging--plain films of left fifth digit showed no fractures or other osseous abnormalities.  No evidence of compartment syndrome or other emergent crush injury. Encouraged ice and NSAIDs for inflammation and discomfort. Patient overall appears well and is appropriate for discharge. I discussed all relevant lab findings and imaging results with pt and  they verbalized understanding. Discussed f/u with PCP within 48 hrs and return precautions, pt very amenable to plan.  Final diagnoses:  Finger pain, left    I personally performed the services described in this documentation, which was scribed in my presence. The recorded information has been reviewed and is accurate.    Comer Locket, PA-C 04/03/15 [place], MD 04/04/15 1247

## 2015-04-09 ENCOUNTER — Encounter: Payer: Self-pay | Admitting: Family Medicine

## 2015-04-09 ENCOUNTER — Ambulatory Visit: Payer: Self-pay | Attending: Family Medicine | Admitting: Family Medicine

## 2015-04-09 VITALS — BP 118/68 | HR 55 | Temp 98.2°F | Resp 16 | Ht 71.0 in | Wt 204.0 lb

## 2015-04-09 DIAGNOSIS — Z87891 Personal history of nicotine dependence: Secondary | ICD-10-CM | POA: Insufficient documentation

## 2015-04-09 DIAGNOSIS — L309 Dermatitis, unspecified: Secondary | ICD-10-CM | POA: Insufficient documentation

## 2015-04-09 DIAGNOSIS — W230XXD Caught, crushed, jammed, or pinched between moving objects, subsequent encounter: Secondary | ICD-10-CM | POA: Insufficient documentation

## 2015-04-09 DIAGNOSIS — H547 Unspecified visual loss: Secondary | ICD-10-CM | POA: Insufficient documentation

## 2015-04-09 DIAGNOSIS — J4520 Mild intermittent asthma, uncomplicated: Secondary | ICD-10-CM

## 2015-04-09 DIAGNOSIS — M79645 Pain in left finger(s): Secondary | ICD-10-CM | POA: Insufficient documentation

## 2015-04-09 DIAGNOSIS — J309 Allergic rhinitis, unspecified: Secondary | ICD-10-CM | POA: Insufficient documentation

## 2015-04-09 DIAGNOSIS — J45909 Unspecified asthma, uncomplicated: Secondary | ICD-10-CM | POA: Insufficient documentation

## 2015-04-09 MED ORDER — CETIRIZINE HCL 10 MG PO TABS: 10.0000 mg | ORAL_TABLET | Freq: Every day | ORAL | Status: DC

## 2015-04-09 MED ORDER — FLUTICASONE PROPIONATE 50 MCG/ACT NA SUSP: 2.0000 | Freq: Every day | NASAL | Status: DC

## 2015-04-09 MED ORDER — ALBUTEROL SULFATE HFA 108 (90 BASE) MCG/ACT IN AERS: 2.0000 | INHALATION_SPRAY | Freq: Four times a day (QID) | RESPIRATORY_TRACT | Status: DC | PRN

## 2015-04-09 MED ORDER — TRIAMCINOLONE ACETONIDE 0.025 % EX OINT: 1.0000 "application " | TOPICAL_OINTMENT | Freq: Two times a day (BID) | CUTANEOUS | Status: DC

## 2015-04-09 NOTE — Patient Instructions (Addendum)
Adrian [name] was seen today for follow-up and finger injury.  Diagnoses and all orders for this visit:  Asthma in adult, mild intermittent, uncomplicated -     albuterol (PROVENTIL HFA;VENTOLIN HFA) 108 (90 BASE) MCG/ACT inhaler; Inhale 2 puffs into the lungs every 6 (six) hours as needed for wheezing or shortness of breath. -     cetirizine (ZYRTEC) 10 MG tablet; Take 1 tablet (10 mg total) by mouth daily. -     triamcinolone (KENALOG) 0.025 % ointment; Apply 1 application topically 2 (two) times daily.  Allergic rhinitis, unspecified allergic rhinitis type -     fluticasone (FLONASE) 50 MCG/ACT nasal spray; Place 2 sprays into both nostrils daily.   Low out cost optometrist (about $65.00 for office visit)  1. Dr. Thurston Hole Phone # [phone] [address]  [place], [place] [address]   2. [facility]  Phone # [phone] [address] [place],  [address]     Dr. Adrian Blackwater

## 2015-04-09 NOTE — Progress Notes (Signed)
HFU due to injury on 5th lt  finger  Pain scale #4 Need letter for work  Medicine refills

## 2015-04-09 NOTE — Assessment & Plan Note (Signed)
Chronically poor vision Patient referred to optometry

## 2015-04-09 NOTE — Progress Notes (Signed)
Patient ID: Adrian [name], male   DOB: Sep 12, 1975, 39 y.o.   MRN: [id number]   Subjective:  Patient ID: Adrian [name], male    DOB: [birth year]  Age: 39 y.o. MRN: [id number]  CC: Follow-up and Finger Injury   HPI Adrian [name] presents for   1. Left little finger injury: 04/03/2015. Closed finger in car door. Went to ED. No fracture. Still with pain. Has normal ROM. No lacerations. Has been out of work since injury. Has a form for return to work.   2. Asthma: has nasal congestion. No cough or wheezing. Using albuterol. Has run out his albuterol and using his son's. Has increase cough last month. Declines flu shot.   3. Eczema: on hands. Requesting refill of triamcinolone ointment. Has itching. No bleeding.   Social History  Substance Use Topics  . Smoking status: Former Smoker    Types: Cigars    Quit date: 09/13/2012  . Smokeless tobacco: Not on file  . Alcohol Use: No   Outpatient Prescriptions Prior to Visit  Medication Sig Dispense Refill  . albuterol (PROVENTIL HFA;VENTOLIN HFA) 108 (90 BASE) MCG/ACT inhaler Inhale 2 puffs into the lungs every 6 (six) hours as needed for wheezing or shortness of breath. 1 Inhaler 3  . cetirizine (ZYRTEC) 10 MG tablet Take 1 tablet (10 mg total) by mouth daily. 30 tablet 11  . ibuprofen (ADVIL,MOTRIN) 400 MG tablet Take 1 tablet (400 mg total) by mouth every 6 (six) hours as needed. 30 tablet 0  . meloxicam (MOBIC) 15 MG tablet Take 1 tablet (15 mg total) by mouth daily. 30 tablet 0  . Spacer/Aero-Holding Chambers (AEROCHAMBER MAX [facility]) MISC 1 each by Other route once. 1 each 3  . predniSONE (STERAPRED UNI-PAK 21 TAB) 10 MG (21) TBPK tablet Take 1 tablet (10 mg total) by mouth daily. Day 1: take 6 tabs.  Day 2: 5 tabs  Day 3: 4 tabs  Day 4: 3 tabs  Day 5: 2 tabs  Day 6: 1 tab (Patient not taking: Reported on 04/09/2015) 21 tablet 0   No facility-administered medications prior to visit.    ROS Review of Systems  Constitutional:  Negative for fever, chills, fatigue and unexpected weight change.  HENT: Positive for congestion.   Eyes: Positive for visual disturbance.  Respiratory: Negative for cough and shortness of breath.   Cardiovascular: Negative for chest pain, palpitations and leg swelling.  Gastrointestinal: Negative for nausea, vomiting, abdominal pain, diarrhea, constipation and blood in stool.  Endocrine: Negative for polydipsia, polyphagia and polyuria.  Musculoskeletal: Negative for myalgias, back pain, arthralgias, gait problem and neck pain.  Skin: Positive for rash.  Allergic/Immunologic: Negative for immunocompromised state.  Hematological: Negative for adenopathy. Does not bruise/bleed easily.  Psychiatric/Behavioral: Negative for suicidal ideas, sleep disturbance and dysphoric mood. The patient is not nervous/anxious.   GAD-7: score of 4. 1-2,3,5,7.   Objective:  BP 118/68 mmHg  Pulse 55  Temp(Src) 98.2 F (36.8 C) (Oral)  Resp 16  Ht 5\' 11"  (1.803 m)  Wt 204 lb (92.534 kg)  BMI 28.46 kg/m2  SpO2 97%  BP/Weight 04/09/2015 04/03/2015 01/12/9677  Systolic BP 938 101 751  Diastolic BP 68 66 61  Wt. (Lbs) 204 205 -  BMI 28.46 28.6 -   Physical Exam  Constitutional: He appears well-developed and well-nourished. No distress.  HENT:  Head: Normocephalic and atraumatic.  Right Ear: Tympanic membrane, external ear and ear canal normal.  Left Ear: Tympanic membrane, external ear and ear canal normal.  Nose: Mucosal edema present.  Neck: Normal range of motion. Neck supple.  Cardiovascular: Normal rate, regular rhythm, normal heart sounds and intact distal pulses.   Pulmonary/Chest: Effort normal and breath sounds normal.  Musculoskeletal: He exhibits tenderness. He exhibits no edema.  Neurological: He is alert.  Skin: Skin is warm and dry. No rash noted. No erythema.  Psychiatric: He has a normal mood and affect.     Assessment & Plan:   Problem List Items Addressed This Visit     Allergic rhinitis   Relevant Medications   fluticasone (FLONASE) 50 MCG/ACT nasal spray   Asthma in adult - Primary (Chronic)   Relevant Medications   albuterol (PROVENTIL HFA;VENTOLIN HFA) 108 (90 BASE) MCG/ACT inhaler   cetirizine (ZYRTEC) 10 MG tablet   triamcinolone (KENALOG) 0.025 % ointment   Poor vision    Chronically poor vision Patient referred to optometry          No orders of the defined types were placed in this encounter.    Follow-up: Return in about 3 months (around 07/10/2015) for asthma.   Boykin Nearing MD

## 2015-06-11 ENCOUNTER — Encounter (HOSPITAL_COMMUNITY): Payer: Self-pay | Admitting: Family Medicine

## 2015-06-11 ENCOUNTER — Emergency Department (HOSPITAL_COMMUNITY): Payer: No Typology Code available for payment source

## 2015-06-11 ENCOUNTER — Emergency Department (HOSPITAL_COMMUNITY)
Admission: EM | Admit: 2015-06-11 | Discharge: 2015-06-11 | Disposition: A | Discharge status: Home / Self Care | Payer: No Typology Code available for payment source | Attending: Emergency Medicine | Admitting: Emergency Medicine

## 2015-06-11 DIAGNOSIS — Y92331 Roller skating rink as the place of occurrence of the external cause: Secondary | ICD-10-CM | POA: Diagnosis not present

## 2015-06-11 DIAGNOSIS — Z87891 Personal history of nicotine dependence: Secondary | ICD-10-CM | POA: Insufficient documentation

## 2015-06-11 DIAGNOSIS — J45909 Unspecified asthma, uncomplicated: Secondary | ICD-10-CM | POA: Insufficient documentation

## 2015-06-11 DIAGNOSIS — Y990 Civilian activity done for income or pay: Secondary | ICD-10-CM | POA: Diagnosis not present

## 2015-06-11 DIAGNOSIS — M545 Low back pain: Secondary | ICD-10-CM

## 2015-06-11 DIAGNOSIS — Z7951 Long term (current) use of inhaled steroids: Secondary | ICD-10-CM | POA: Diagnosis not present

## 2015-06-11 DIAGNOSIS — Z8679 Personal history of other diseases of the circulatory system: Secondary | ICD-10-CM | POA: Insufficient documentation

## 2015-06-11 DIAGNOSIS — Z872 Personal history of diseases of the skin and subcutaneous tissue: Secondary | ICD-10-CM | POA: Diagnosis not present

## 2015-06-11 DIAGNOSIS — Z79899 Other long term (current) drug therapy: Secondary | ICD-10-CM | POA: Insufficient documentation

## 2015-06-11 DIAGNOSIS — R011 Cardiac murmur, unspecified: Secondary | ICD-10-CM | POA: Insufficient documentation

## 2015-06-11 DIAGNOSIS — Z8781 Personal history of (healed) traumatic fracture: Secondary | ICD-10-CM | POA: Diagnosis not present

## 2015-06-11 DIAGNOSIS — Z91040 Latex allergy status: Secondary | ICD-10-CM | POA: Diagnosis not present

## 2015-06-11 DIAGNOSIS — S3992XA Unspecified injury of lower back, initial encounter: Secondary | ICD-10-CM | POA: Insufficient documentation

## 2015-06-11 DIAGNOSIS — Y9351 Activity, roller skating (inline) and skateboarding: Secondary | ICD-10-CM | POA: Diagnosis not present

## 2015-06-11 DIAGNOSIS — Z791 Long term (current) use of non-steroidal anti-inflammatories (NSAID): Secondary | ICD-10-CM | POA: Diagnosis not present

## 2015-06-11 DIAGNOSIS — Z7952 Long term (current) use of systemic steroids: Secondary | ICD-10-CM | POA: Diagnosis not present

## 2015-06-11 DIAGNOSIS — W19XXXA Unspecified fall, initial encounter: Secondary | ICD-10-CM

## 2015-06-11 MED ORDER — IBUPROFEN 800 MG PO TABS: 800.0000 mg | ORAL_TABLET | Freq: Three times a day (TID) | ORAL | Status: DC

## 2015-06-11 MED ORDER — IBUPROFEN 400 MG PO TABS
800.0000 mg | ORAL_TABLET | Freq: Once | ORAL | Status: AC
  Administered 2015-06-11: 800 mg via ORAL
  Filled 2015-06-11: qty 2

## 2015-06-11 NOTE — ED Provider Notes (Signed)
CSN: [id number]     Arrival date & time 06/11/15  [id number] History   By signing my name below, I, Evelene Croon, attest that this documentation has been prepared under the direction and in the presence of non-physician practitioner, Gloriann Loan, PA-C. Electronically Signed: Evelene Croon, Scribe. 06/11/2015. 11:32 AM.    Chief Complaint  Patient presents with  . Back Pain  . Tailbone Pain    The history is provided by the patient. No language interpreter was used.    HPI Comments:  Adrian [name] is a 39 y.o. male who presents to the Emergency Department complaining of non-radiating lower back pain s/p fall 3 days ago. Pt states he fell while roller skating and landed on his buttocks; no head injury or LOC. He notes his pain is intermitently sharp. Pt has taken three 800mg  tylenol this AM without relief. He denies bowel /bladder incontinence, saddle anesthesia, numbness and tingling in his extremities, abdominal pain and h/o IVDA.  Past Medical History  Diagnosis Date  . Eczema   . Migraine   . Foot fracture, left   . Multiple food allergies     fish, tomatoes, peanuts, eggs, chicken and others  . Environmental allergies     grass  . Heart murmur   . 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 Inguinal hernia repair  1994   Family History  Problem Relation Age of Onset  . Asthma Father   . Clotting disorder      Aunt  . Breast cancer      Grandmother  . Prostate cancer      uncle  . Diabetes Mother   . Hypertension Mother   . Hyperlipidemia      grandparent  . Hypertension      grandparent/grandparent  . Kidney disease      aunt/uncle/grandparent  . Stroke      grandparent   Social History  Substance Use Topics  . Smoking status: Former Smoker    Types: Cigars    Quit date: 09/13/2012  . Smokeless tobacco: None  . Alcohol Use: No    Review of Systems  Constitutional: Negative for fever and chills.  Respiratory: Negative for  shortness of breath.   Cardiovascular: Negative for chest pain.  Musculoskeletal: Positive for back pain.  All other systems reviewed and are negative.     Allergies  Atrovent; Peanut-containing drug products; Shellfish allergy; Tomato; and Latex  Home Medications   Prior to Admission medications   Medication Sig Start Date End Date Taking? Authorizing Provider  albuterol (PROVENTIL HFA;VENTOLIN HFA) 108 (90 BASE) MCG/ACT inhaler Inhale 2 puffs into the lungs every 6 (six) hours as needed for wheezing or shortness of breath. 04/09/15   Josalyn Funches, MD  cetirizine (ZYRTEC) 10 MG tablet Take 1 tablet (10 mg total) by mouth daily. 04/09/15   Josalyn Funches, MD  fluticasone (FLONASE) 50 MCG/ACT nasal spray Place 2 sprays into both nostrils daily. 04/09/15   Josalyn Funches, MD  ibuprofen (ADVIL,MOTRIN) 800 MG tablet Take 1 tablet (800 mg total) by mouth 3 (three) times daily. 06/11/15   Gloriann Loan, PA-C  meloxicam (MOBIC) 15 MG tablet Take 1 tablet (15 mg total) by mouth daily. 12/15/14   Boykin Nearing, MD  Spacer/Aero-Holding Chambers (AEROCHAMBER MAX [facility]) MISC 1 each by Other route once. 01/26/12   Gerda Diss, MD  triamcinolone (KENALOG) 0.025 % ointment Apply 1 application topically 2 (two) times daily.  04/09/15   Josalyn Funches, MD   BP 115/77 mmHg  Pulse 45  Temp(Src) 98 F (36.7 C) (Oral)  Resp 18  SpO2 99% Physical Exam  Constitutional: He is oriented to person, place, and time. He appears well-developed and well-nourished. No distress.  HENT:  Head: Normocephalic and atraumatic.  Eyes: Conjunctivae are normal.  Neck: Normal range of motion. Neck supple.  No cervical midline tenderness.   Cardiovascular: Normal rate, regular rhythm, normal heart sounds and intact distal pulses.   Pulses:      Posterior tibial pulses are 2+ on the right side, and 2+ on the left side.  Pulmonary/Chest: Effort normal and breath sounds normal.  Abdominal: Soft. Bowel sounds  are normal. He exhibits no distension. There is no tenderness.  Musculoskeletal: Normal range of motion. He exhibits tenderness.       Lumbar back: He exhibits tenderness and pain. He exhibits normal range of motion, no bony tenderness, no swelling, no deformity, no laceration, no spasm and normal pulse.  No spinous process tenderness.  No step offs.  Neurological: He is alert and oriented to person, place, and time.  No saddle anesthesia.  Strength and sensation intact bilaterally throughout upper and lower extremities.   Skin: Skin is warm and dry.  Psychiatric: He has a normal mood and affect. His behavior is normal.  Nursing note and vitals reviewed.   ED Course  Procedures   DIAGNOSTIC STUDIES:  Oxygen Saturation is 99% on RA, normal by my interpretation.    COORDINATION OF CARE:  11:30 AM Discussed treatment plan with pt at bedside and pt agreed to plan.   Imaging Review Dg Lumbar Spine Complete  06/11/2015  CLINICAL DATA:  Non radiating low back pain status post fall 3 days ago EXAM: LUMBAR SPINE - COMPLETE 4+ VIEW COMPARISON:  12/20/2007 FINDINGS: There is no evidence of lumbar spine fracture. Alignment is normal. Degenerative disc disease with disc height loss at L5-S1. IMPRESSION: Degenerative disc disease at L5-S1. Electronically Signed   By: Kathreen Devoid   On: 06/11/2015 11:59   I have personally reviewed and evaluated these images and lab results as part of my medical decision-making.   MDM   Final diagnoses:  Fall, initial encounter  Low back pain without sciatica, unspecified back pain laterality    Patient presents with fall now with lower back and buttock pain.  No bowel or bladder incontinence.  No saddle anesthesia.  No numbness, tingling, or weakness in lower extremities.  VSS, NAD.  On exam, no lumbar midline tenderness.  No neurological deficits.  Intact distal pulses.  Will give motrin and obtain lumbar plain films.  Plain films negative.  Will d/c home  with motrin. Evaluation does not show pathology requring ongoing emergent intervention or admission. Pt is hemodynamically stable and mentating appropriately. Discussed findings/results and plan with patient/guardian, who agrees with plan. All questions answered. Return precautions discussed and outpatient follow up given.    I personally performed the services described in this documentation, which was scribed in my presence. The recorded information has been reviewed and is accurate.    Gloriann Loan, PA-C 06/11/15 1215  Daleen Bo, MD 06/12/15 1754

## 2015-06-11 NOTE — Discharge Instructions (Signed)

## 2015-06-11 NOTE — ED Notes (Signed)
Returned from X-Ray.

## 2015-06-11 NOTE — ED Notes (Signed)
Pt here for right lower back pain and buttocks pain. sts he fell skating. sts sharp and stabbing.

## 2015-06-11 NOTE — ED Notes (Signed)
Declined W/C at D/C and was escorted to lobby by RN. 

## 2015-09-02 ENCOUNTER — Encounter (HOSPITAL_COMMUNITY): Payer: Self-pay | Admitting: Emergency Medicine

## 2015-09-02 DIAGNOSIS — Z8781 Personal history of (healed) traumatic fracture: Secondary | ICD-10-CM | POA: Insufficient documentation

## 2015-09-02 DIAGNOSIS — R1013 Epigastric pain: Secondary | ICD-10-CM | POA: Insufficient documentation

## 2015-09-02 DIAGNOSIS — R1012 Left upper quadrant pain: Secondary | ICD-10-CM | POA: Insufficient documentation

## 2015-09-02 DIAGNOSIS — R6883 Chills (without fever): Secondary | ICD-10-CM | POA: Insufficient documentation

## 2015-09-02 DIAGNOSIS — R112 Nausea with vomiting, unspecified: Secondary | ICD-10-CM | POA: Insufficient documentation

## 2015-09-02 DIAGNOSIS — Z87891 Personal history of nicotine dependence: Secondary | ICD-10-CM | POA: Insufficient documentation

## 2015-09-02 DIAGNOSIS — Z91040 Latex allergy status: Secondary | ICD-10-CM | POA: Insufficient documentation

## 2015-09-02 DIAGNOSIS — Z79899 Other long term (current) drug therapy: Secondary | ICD-10-CM | POA: Insufficient documentation

## 2015-09-02 DIAGNOSIS — J45909 Unspecified asthma, uncomplicated: Secondary | ICD-10-CM | POA: Insufficient documentation

## 2015-09-02 DIAGNOSIS — Z7951 Long term (current) use of inhaled steroids: Secondary | ICD-10-CM | POA: Insufficient documentation

## 2015-09-02 DIAGNOSIS — R011 Cardiac murmur, unspecified: Secondary | ICD-10-CM | POA: Insufficient documentation

## 2015-09-02 DIAGNOSIS — R197 Diarrhea, unspecified: Secondary | ICD-10-CM | POA: Insufficient documentation

## 2015-09-02 DIAGNOSIS — Z872 Personal history of diseases of the skin and subcutaneous tissue: Secondary | ICD-10-CM | POA: Insufficient documentation

## 2015-09-02 NOTE — ED Notes (Addendum)
Pt reports L sided abd pain, nausea, vomiting, and diarrhea since yesterday.  Denies pain at present.  Denies fever or upper respiratory symptoms at home.

## 2015-09-03 ENCOUNTER — Emergency Department (HOSPITAL_COMMUNITY)
Admission: EM | Admit: 2015-09-03 | Discharge: 2015-09-03 | Disposition: A | Discharge status: Home / Self Care | Payer: No Typology Code available for payment source | Attending: Emergency Medicine | Admitting: Emergency Medicine

## 2015-09-03 DIAGNOSIS — R112 Nausea with vomiting, unspecified: Secondary | ICD-10-CM

## 2015-09-03 DIAGNOSIS — R197 Diarrhea, unspecified: Secondary | ICD-10-CM

## 2015-09-03 LAB — CBC
HEMATOCRIT: 43 % (ref 39.0–52.0)
HEMOGLOBIN: 13.8 g/dL (ref 13.0–17.0)
MCH: 26 pg (ref 26.0–34.0)
MCHC: 32.1 g/dL (ref 30.0–36.0)
MCV: 81.1 fL (ref 78.0–100.0)
PLATELETS: 220 10*3/uL (ref 150–400)
RBC: 5.3 MIL/uL (ref 4.22–5.81)
RDW: 13.9 % (ref 11.5–15.5)
WBC: 8.3 10*3/uL (ref 4.0–10.5)

## 2015-09-03 LAB — COMPREHENSIVE METABOLIC PANEL
ALT: 15 U/L — ABNORMAL LOW (ref 17–63)
ANION GAP: 11 (ref 5–15)
AST: 23 U/L (ref 15–41)
Albumin: 3.9 g/dL (ref 3.5–5.0)
Alkaline Phosphatase: 71 U/L (ref 38–126)
BILIRUBIN TOTAL: 0.8 mg/dL (ref 0.3–1.2)
BUN: 8 mg/dL (ref 6–20)
CHLORIDE: 102 mmol/L (ref 101–111)
CO2: 25 mmol/L (ref 22–32)
Calcium: 9.6 mg/dL (ref 8.9–10.3)
Creatinine, Ser: 1.3 mg/dL — ABNORMAL HIGH (ref 0.61–1.24)
GFR calc Af Amer: >60 mL/min (ref >60)
Glucose, Bld: 95 mg/dL (ref 65–99)
POTASSIUM: 4.2 mmol/L (ref 3.5–5.1)
Sodium: 138 mmol/L (ref 135–145)
TOTAL PROTEIN: 7.6 g/dL (ref 6.5–8.1)

## 2015-09-03 LAB — URINE MICROSCOPIC-ADD ON: WBC UA: NONE SEEN WBC/hpf (ref 0–5)

## 2015-09-03 LAB — LIPASE, BLOOD: LIPASE: 25 U/L (ref 11–51)

## 2015-09-03 LAB — URINALYSIS, ROUTINE W REFLEX MICROSCOPIC
Glucose, UA: NEGATIVE mg/dL
Ketones, ur: 15 mg/dL — AB
Leukocytes, UA: NEGATIVE
NITRITE: NEGATIVE
PROTEIN: 30 mg/dL — AB
SPECIFIC GRAVITY, URINE: 1.037 — AB (ref 1.005–1.030)
pH: 6 (ref 5.0–8.0)

## 2015-09-03 MED ORDER — SODIUM CHLORIDE 0.9 % IV BOLUS (SEPSIS)
1000.0000 mL | Freq: Once | INTRAVENOUS | Status: AC
  Administered 2015-09-03: 1000 mL via INTRAVENOUS

## 2015-09-03 MED ORDER — ALBUTEROL SULFATE (2.5 MG/3ML) 0.083% IN NEBU
5.0000 mg | INHALATION_SOLUTION | Freq: Once | RESPIRATORY_TRACT | Status: AC
  Administered 2015-09-03: 5 mg via RESPIRATORY_TRACT
  Filled 2015-09-03: qty 6

## 2015-09-03 MED ORDER — ONDANSETRON HCL 4 MG/2ML IJ SOLN
4.0000 mg | Freq: Once | INTRAMUSCULAR | Status: AC
  Administered 2015-09-03: 4 mg via INTRAVENOUS
  Filled 2015-09-03: qty 2

## 2015-09-03 MED ORDER — ONDANSETRON 8 MG PO TBDP: 8.0000 mg | ORAL_TABLET | Freq: Three times a day (TID) | ORAL | Status: DC | PRN

## 2015-09-03 MED ORDER — KETOROLAC TROMETHAMINE 30 MG/ML IJ SOLN
30.0000 mg | Freq: Once | INTRAMUSCULAR | Status: AC
  Administered 2015-09-03: 30 mg via INTRAVENOUS
  Filled 2015-09-03: qty 1

## 2015-09-03 MED ORDER — ALBUTEROL SULFATE HFA 108 (90 BASE) MCG/ACT IN AERS
2.0000 | INHALATION_SPRAY | Freq: Once | RESPIRATORY_TRACT | Status: AC
  Administered 2015-09-03: 2 via RESPIRATORY_TRACT
  Filled 2015-09-03: qty 6.7

## 2015-09-03 NOTE — Discharge Instructions (Signed)

## 2015-09-03 NOTE — ED Provider Notes (Signed)
CSN: [id number]     Arrival date & time 09/02/15  2314 History  By signing my name below, I, Irene Pap, attest that this documentation has been prepared under the direction and in the presence of Davonna Belling, MD. Electronically Signed: Irene Pap, ED Scribe. 09/03/2015. 3:48 AM.   Chief Complaint  Patient presents with  . Abdominal Pain   The history is provided by the patient. No language interpreter was used.  HPI Comments: Adrian [name] is a 40 y.o. male who presents to the Emergency Department complaining of left sided abdominal pain onset one day ago. He reports associated chills, sore throat that worsens with swallowing, nausea, vomiting, and diarrhea. He reports worsening pain with deep breathing. He states that he ate at a restaurant and began to experience symptoms after. He has not tried anything for his symptoms. He denies current pain. He denies fever, cough, or SOB.    Past Medical History  Diagnosis Date  . Eczema   . Migraine   . Foot fracture, left   . Multiple food allergies     fish, tomatoes, peanuts, eggs, chicken and others  . Environmental allergies     grass  . Heart murmur   . 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 Inguinal hernia repair  1994   Family History  Problem Relation Age of Onset  . Asthma Father   . Clotting disorder      Aunt  . Breast cancer      Grandmother  . Prostate cancer      uncle  . Diabetes Mother   . Hypertension Mother   . Hyperlipidemia      grandparent  . Hypertension      grandparent/grandparent  . Kidney disease      aunt/uncle/grandparent  . Stroke      grandparent   Social History  Substance Use Topics  . Smoking status: Former Smoker    Types: Cigars    Quit date: 09/13/2012  . Smokeless tobacco: None  . Alcohol Use: No    Review of Systems  Constitutional: Positive for chills. Negative for fever.  HENT: Positive for sore throat.   Respiratory: Negative  for cough and shortness of breath.   Gastrointestinal: Positive for nausea, vomiting, abdominal pain and diarrhea.  All other systems reviewed and are negative.  Allergies  Atrovent; Peanut-containing drug products; Shellfish allergy; Tomato; and Latex  Home Medications   Prior to Admission medications   Medication Sig Start Date End Date Taking? Authorizing Provider  albuterol (PROVENTIL HFA;VENTOLIN HFA) 108 (90 BASE) MCG/ACT inhaler Inhale 2 puffs into the lungs every 6 (six) hours as needed for wheezing or shortness of breath. 04/09/15  Yes Josalyn Funches, MD  cetirizine (ZYRTEC) 10 MG tablet Take 1 tablet (10 mg total) by mouth daily. Patient taking differently: Take 10 mg by mouth daily as needed for allergies.  04/09/15  Yes Josalyn Funches, MD  fluticasone (FLONASE) 50 MCG/ACT nasal spray Place 2 sprays into both nostrils daily. Patient taking differently: Place 2 sprays into both nostrils daily as needed for allergies.  04/09/15  Yes Josalyn Funches, MD  ibuprofen (ADVIL,MOTRIN) 200 MG tablet Take 400 mg by mouth every 6 (six) hours as needed for moderate pain.   Yes Historical Provider, MD  ondansetron (ZOFRAN-ODT) 8 MG disintegrating tablet Take 1 tablet (8 mg total) by mouth every 8 (eight) hours as needed for nausea or vomiting. 09/03/15  Davonna Belling, MD   BP 157/78 mmHg  Pulse 65  Temp(Src) 100.4 F (38 C) (Oral)  Resp 26  Ht 5\' 11"  (1.803 m)  SpO2 98% Physical Exam  Constitutional: He is oriented to person, place, and time. He appears well-developed and well-nourished.  HENT:  Head: Normocephalic and atraumatic.  Mouth/Throat: Posterior oropharyngeal erythema present. No oropharyngeal exudate.  Eyes: EOM are normal. Pupils are equal, round, and reactive to light.  Neck: Normal range of motion. Neck supple.  Cardiovascular: Normal rate, regular rhythm and normal heart sounds.  Exam reveals no gallop and no friction rub.   No murmur heard. Pulmonary/Chest: Effort  normal and breath sounds normal. He has no wheezes.  Abdominal: Soft. There is tenderness in the epigastric area and left upper quadrant. There is no rebound and no guarding.  Musculoskeletal: Normal range of motion.  Neurological: He is alert and oriented to person, place, and time.  Skin: Skin is warm and dry.  Psychiatric: He has a normal mood and affect. His behavior is normal.  Nursing note and vitals reviewed.   ED Course  Procedures (including critical care time) DIAGNOSTIC STUDIES: Oxygen Saturation is 98% on RA, normal by my interpretation.    COORDINATION OF CARE: 3:46 AM-Discussed treatment plan which includes IV fluids with pt at bedside and pt agreed to plan.    Labs Review Labs Reviewed  COMPREHENSIVE METABOLIC PANEL - Abnormal; Notable for the following:    Creatinine, Ser 1.30 (*)    ALT 15 (*)    All other components within normal limits  URINALYSIS, ROUTINE W REFLEX MICROSCOPIC (NOT AT [facility]) - Abnormal; Notable for the following:    Color, Urine AMBER (*)    Specific Gravity, Urine 1.037 (*)    Hgb urine dipstick TRACE (*)    Bilirubin Urine SMALL (*)    Ketones, ur 15 (*)    Protein, ur 30 (*)    All other components within normal limits  URINE MICROSCOPIC-ADD ON - Abnormal; Notable for the following:    Squamous Epithelial / LPF 0-5 (*)    Bacteria, UA RARE (*)    All other components within normal limits  LIPASE, BLOOD  CBC    Imaging Review No results found. I have personally reviewed and evaluated these images and lab results as part of my medical decision-making.   EKG Interpretation None      MDM   Final diagnoses:  Nausea vomiting and diarrhea    Patient nausea vomiting diarrhea. Some dehydration but feels better after IV fluid. No tolerating orals. Will discharge home. I personally performed the services described in this documentation, which was scribed in my presence. The recorded information has been reviewed and is accurate.       Davonna Belling, MD 09/03/15 [phone]

## 2015-09-06 ENCOUNTER — Emergency Department (HOSPITAL_COMMUNITY)
Admission: EM | Admit: 2015-09-06 | Discharge: 2015-09-06 | Disposition: A | Discharge status: Home / Self Care | Payer: No Typology Code available for payment source | Attending: Emergency Medicine | Admitting: Emergency Medicine

## 2015-09-06 ENCOUNTER — Encounter (HOSPITAL_COMMUNITY): Payer: Self-pay | Admitting: Cardiology

## 2015-09-06 DIAGNOSIS — Z91040 Latex allergy status: Secondary | ICD-10-CM | POA: Insufficient documentation

## 2015-09-06 DIAGNOSIS — R42 Dizziness and giddiness: Secondary | ICD-10-CM | POA: Insufficient documentation

## 2015-09-06 DIAGNOSIS — Z79899 Other long term (current) drug therapy: Secondary | ICD-10-CM | POA: Insufficient documentation

## 2015-09-06 DIAGNOSIS — R51 Headache: Secondary | ICD-10-CM | POA: Insufficient documentation

## 2015-09-06 DIAGNOSIS — R519 Headache, unspecified: Secondary | ICD-10-CM

## 2015-09-06 DIAGNOSIS — H53149 Visual discomfort, unspecified: Secondary | ICD-10-CM | POA: Insufficient documentation

## 2015-09-06 DIAGNOSIS — Z872 Personal history of diseases of the skin and subcutaneous tissue: Secondary | ICD-10-CM | POA: Insufficient documentation

## 2015-09-06 DIAGNOSIS — Z87891 Personal history of nicotine dependence: Secondary | ICD-10-CM | POA: Insufficient documentation

## 2015-09-06 DIAGNOSIS — Z8781 Personal history of (healed) traumatic fracture: Secondary | ICD-10-CM | POA: Insufficient documentation

## 2015-09-06 DIAGNOSIS — J45909 Unspecified asthma, uncomplicated: Secondary | ICD-10-CM | POA: Insufficient documentation

## 2015-09-06 DIAGNOSIS — R011 Cardiac murmur, unspecified: Secondary | ICD-10-CM | POA: Insufficient documentation

## 2015-09-06 DIAGNOSIS — H5713 Ocular pain, bilateral: Secondary | ICD-10-CM | POA: Insufficient documentation

## 2015-09-06 LAB — CBC WITH DIFFERENTIAL/PLATELET
BASOS ABS: 0 10*3/uL (ref 0.0–0.1)
Basophils Relative: 1 %
Eosinophils Absolute: 0.1 10*3/uL (ref 0.0–0.7)
Eosinophils Relative: 2 %
HEMATOCRIT: 40.9 % (ref 39.0–52.0)
Hemoglobin: 13 g/dL (ref 13.0–17.0)
LYMPHS ABS: 2.5 10*3/uL (ref 0.7–4.0)
LYMPHS PCT: 47 %
MCH: 25.8 pg — AB (ref 26.0–34.0)
MCHC: 31.8 g/dL (ref 30.0–36.0)
MCV: 81.3 fL (ref 78.0–100.0)
MONO ABS: 0.4 10*3/uL (ref 0.1–1.0)
Monocytes Relative: 7 %
NEUTROS ABS: 2.3 10*3/uL (ref 1.7–7.7)
Neutrophils Relative %: 43 %
Platelets: 173 10*3/uL (ref 150–400)
RBC: 5.03 MIL/uL (ref 4.22–5.81)
RDW: 13.7 % (ref 11.5–15.5)
WBC: 5.3 10*3/uL (ref 4.0–10.5)

## 2015-09-06 LAB — BASIC METABOLIC PANEL
ANION GAP: 14 (ref 5–15)
BUN: 6 mg/dL (ref 6–20)
CALCIUM: 8.3 mg/dL — AB (ref 8.9–10.3)
CO2: 23 mmol/L (ref 22–32)
Chloride: 105 mmol/L (ref 101–111)
Creatinine, Ser: 1.02 mg/dL (ref 0.61–1.24)
GFR calc Af Amer: >60 mL/min (ref >60)
GLUCOSE: 79 mg/dL (ref 65–99)
Potassium: 4.3 mmol/L (ref 3.5–5.1)
Sodium: 142 mmol/L (ref 135–145)

## 2015-09-06 MED ORDER — KETOROLAC TROMETHAMINE 30 MG/ML IJ SOLN
30.0000 mg | Freq: Once | INTRAMUSCULAR | Status: AC
Start: 2015-09-06 — End: 2015-09-06
  Administered 2015-09-06: 30 mg via INTRAVENOUS
  Filled 2015-09-06: qty 1

## 2015-09-06 MED ORDER — SODIUM CHLORIDE 0.9 % IV BOLUS (SEPSIS)
1000.0000 mL | Freq: Once | INTRAVENOUS | Status: AC
  Administered 2015-09-06: 1000 mL via INTRAVENOUS

## 2015-09-06 NOTE — ED Notes (Signed)
Attempted to draw labs from pt. PT refused RN a nd PA aware

## 2015-09-06 NOTE — Discharge Instructions (Signed)
You may take Ibuprofen as prescribed over the counter as needed for pain relief. I recommend drinking 2-3 liters of fluid daily to remain hydrated. Please follow up with a primary care provider from the Resource Guide provided below in 4-5 days if your symptoms have not improved. Please return to the Emergency Department if symptoms worsen or new onset of fever, neck stiffness, visual changes, light sensitivity, abdominal pain, vomiting, urinary symptoms, numbness, tingling, weakness, seizures, syncope.

## 2015-09-06 NOTE — ED Provider Notes (Signed)
CSN: [id number]     Arrival date & time 09/06/15  1025 History   First MD Initiated Contact with Patient 09/06/15 1558     Chief Complaint  Patient presents with  . Headache  . Eye Pain     (Consider location/radiation/quality/duration/timing/severity/associated sxs/prior Treatment) HPI   Patient is a 40 year old male with past medical history of migraines and asthma who presents the ED with complaint of headache, onset 2 days. Patient reports having constant diffuse aching headache, denies any aggravating or alleviating factors. Patient reports his headache is consistent with migraines he has had in the past. Denies taking any medications prior to arrival. Patient endorses having associated bilateral eye pain, photophobia. He also reports having intermittent episodes of lightheadedness which she reports typically occur when he stands up and begins ambulating. Pt denies fever, neck stiffness, visual changes, abdominal pain, N/V, urinary symptoms, numbness, tingling, weakness, seizures, syncope.  Patient denies any recent fall, trauma or injury. Patient denies use of contacts. Patient reports he was seen in the ED on 3/6 /17 for flulike symptoms and was discharged home with symptomatic treatment. He notes his flulike symptoms have resolved over the week but notes he has continued to have a decreased appetite for the past few days.  Past Medical History  Diagnosis Date  . Eczema   . Migraine   . Foot fracture, left   . Multiple food allergies     fish, tomatoes, peanuts, eggs, chicken and others  . Environmental allergies     grass  . Heart murmur   . 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 Inguinal hernia repair  1994   Family History  Problem Relation Age of Onset  . Asthma Father   . Clotting disorder      Aunt  . Breast cancer      Grandmother  . Prostate cancer      uncle  . Diabetes Mother   . Hypertension Mother   . Hyperlipidemia     grandparent  . Hypertension      grandparent/grandparent  . Kidney disease      aunt/uncle/grandparent  . Stroke      grandparent   Social History  Substance Use Topics  . Smoking status: Former Smoker    Types: Cigars    Quit date: 09/13/2012  . Smokeless tobacco: None  . Alcohol Use: No    Review of Systems  Eyes: Positive for photophobia and pain.  Neurological: Positive for light-headedness and headaches.  All other systems reviewed and are negative.     Allergies  Atrovent; Peanut-containing drug products; Shellfish allergy; Tomato; and Latex  Home Medications   Prior to Admission medications   Medication Sig Start Date End Date Taking? Authorizing Provider  albuterol (PROVENTIL HFA;VENTOLIN HFA) 108 (90 BASE) MCG/ACT inhaler Inhale 2 puffs into the lungs every 6 (six) hours as needed for wheezing or shortness of breath. 04/09/15  Yes Josalyn Funches, MD  ibuprofen (ADVIL,MOTRIN) 200 MG tablet Take 400 mg by mouth every 6 (six) hours as needed for moderate pain.   Yes Historical Provider, MD  cetirizine (ZYRTEC) 10 MG tablet Take 1 tablet (10 mg total) by mouth daily. Patient not taking: Reported on 09/06/2015 04/09/15   Boykin Nearing, MD  fluticasone (FLONASE) 50 MCG/ACT nasal spray Place 2 sprays into both nostrils daily. Patient not taking: Reported on 09/06/2015 04/09/15   Boykin Nearing, MD  ondansetron (ZOFRAN-ODT) 8 MG disintegrating tablet  Take 1 tablet (8 mg total) by mouth every 8 (eight) hours as needed for nausea or vomiting. Patient not taking: Reported on 09/06/2015 09/03/15   Davonna Belling, MD   BP 110/90 mmHg  Pulse 44  Temp(Src) 97.3 F (36.3 C) (Oral)  Resp 16  Wt 92.534 kg  SpO2 100% Physical Exam  Constitutional: He is oriented to person, place, and time. He appears well-developed and well-nourished.  HENT:  Head: Normocephalic and atraumatic.  Right Ear: Tympanic membrane normal.  Left Ear: Tympanic membrane normal.  Nose: Nose normal.  Right sinus exhibits no maxillary sinus tenderness and no frontal sinus tenderness. Left sinus exhibits no maxillary sinus tenderness and no frontal sinus tenderness.  Mouth/Throat: Uvula is midline, oropharynx is clear and moist and mucous membranes are normal. No oropharyngeal exudate, posterior oropharyngeal edema, posterior oropharyngeal erythema or tonsillar abscesses.  Eyes: Conjunctivae and EOM are normal. Pupils are equal, round, and reactive to light. Lids are everted and swept, no foreign bodies found. Right eye exhibits no discharge. Left eye exhibits no discharge. Right conjunctiva is not injected. Right conjunctiva has no hemorrhage. Left conjunctiva is not injected. Left conjunctiva has no hemorrhage. No scleral icterus. Right eye exhibits no nystagmus. Left eye exhibits no nystagmus.  Neck: Normal range of motion. Neck supple.  Cardiovascular: Normal rate, regular rhythm, normal heart sounds and intact distal pulses.   Pulmonary/Chest: Effort normal and breath sounds normal. No respiratory distress. He has no wheezes. He has no rales. He exhibits no tenderness.  Abdominal: Soft. Bowel sounds are normal. He exhibits no distension and no mass. There is no tenderness. There is no rebound and no guarding.  Musculoskeletal: Normal range of motion. He exhibits no edema.  Lymphadenopathy:    He has no cervical adenopathy.  Neurological: He is alert and oriented to person, place, and time. He has normal strength and normal reflexes. No cranial nerve deficit or sensory deficit. Coordination and gait normal.  Skin: Skin is warm and dry.  Nursing note and vitals reviewed.   ED Course  Procedures (including critical care time) Labs Review Labs Reviewed  CBC WITH DIFFERENTIAL/PLATELET - Abnormal; Notable for the following:    MCH 25.8 (*)    All other components within normal limits  BASIC METABOLIC PANEL - Abnormal; Notable for the following:    Calcium 8.3 (*)    All other components  within normal limits    Imaging Review No results found. I have personally reviewed and evaluated these images and lab results as part of my medical decision-making.   EKG Interpretation None      MDM   Final diagnoses:  Nonintractable headache, unspecified chronicity pattern, unspecified headache type    Pt presents with HA, eye pain, lightheadedness and photophobia. Hx of migraines, pt reports this migraine feels similar to his typical migraine. Pt was seen in the ED on 09/03/15 for flu-like sxs, labs unremarkable, given IVF in the ED and d/c home with symptomatic tx. Pt reports his flu-like sxs have improved over the past few days. VSS. Exam unremarkable. No neuro deficits. Patient given IV fluids and IV Toradol. Orthostatics negative. Visual acuity unremarkable. I do not suspect intracranial lesion, meningitis, encephalopathy or encephalitis and do not think that cranial imaging is needed at this time. Labs unremarkable. On reevaluation patient is standing up and walking around the room without any assistance, no ataxia noted. Patient reports his headache has improved. Discussed results and plan for discharge with patient with symptomatic tx. Patient given  resources to follow up with PCP.    Chesley Noon [place], [place] 09/06/15 1834  Sherwood Gambler, MD 09/12/15 [phone]

## 2015-09-06 NOTE — ED Notes (Signed)
Pt reports flu like illness for the past week. States he has continued to feel bad, with a headache and eye pressure.

## 2015-09-06 NOTE — ED Notes (Signed)
Departed in NAD.

## 2015-09-13 ENCOUNTER — Encounter: Payer: Self-pay | Admitting: Clinical

## 2015-09-13 ENCOUNTER — Ambulatory Visit: Payer: Self-pay | Attending: Family Medicine | Admitting: Family Medicine

## 2015-09-13 ENCOUNTER — Encounter: Payer: Self-pay | Admitting: Family Medicine

## 2015-09-13 VITALS — BP 121/67 | HR 58 | Temp 97.9°F | Resp 16 | Ht 67.0 in | Wt 211.0 lb

## 2015-09-13 DIAGNOSIS — D179 Benign lipomatous neoplasm, unspecified: Secondary | ICD-10-CM | POA: Insufficient documentation

## 2015-09-13 DIAGNOSIS — Z87891 Personal history of nicotine dependence: Secondary | ICD-10-CM | POA: Insufficient documentation

## 2015-09-13 DIAGNOSIS — J309 Allergic rhinitis, unspecified: Secondary | ICD-10-CM | POA: Insufficient documentation

## 2015-09-13 DIAGNOSIS — J4520 Mild intermittent asthma, uncomplicated: Secondary | ICD-10-CM

## 2015-09-13 DIAGNOSIS — G43709 Chronic migraine without aura, not intractable, without status migrainosus: Secondary | ICD-10-CM

## 2015-09-13 DIAGNOSIS — L309 Dermatitis, unspecified: Secondary | ICD-10-CM | POA: Insufficient documentation

## 2015-09-13 DIAGNOSIS — IMO0002 Reserved for concepts with insufficient information to code with codable children: Secondary | ICD-10-CM

## 2015-09-13 DIAGNOSIS — J45909 Unspecified asthma, uncomplicated: Secondary | ICD-10-CM | POA: Insufficient documentation

## 2015-09-13 DIAGNOSIS — R51 Headache: Secondary | ICD-10-CM | POA: Insufficient documentation

## 2015-09-13 DIAGNOSIS — G43909 Migraine, unspecified, not intractable, without status migrainosus: Secondary | ICD-10-CM | POA: Insufficient documentation

## 2015-09-13 DIAGNOSIS — Z79899 Other long term (current) drug therapy: Secondary | ICD-10-CM | POA: Insufficient documentation

## 2015-09-13 MED ORDER — NORTRIPTYLINE HCL 25 MG PO CAPS: 25.0000 mg | ORAL_CAPSULE | Freq: Every day | ORAL | Status: DC

## 2015-09-13 MED ORDER — FLUTICASONE PROPIONATE 50 MCG/ACT NA SUSP: 2.0000 | Freq: Every day | NASAL | Status: DC

## 2015-09-13 MED ORDER — ALBUTEROL SULFATE HFA 108 (90 BASE) MCG/ACT IN AERS: 2.0000 | INHALATION_SPRAY | Freq: Four times a day (QID) | RESPIRATORY_TRACT | Status: DC | PRN

## 2015-09-13 MED ORDER — TRIAMCINOLONE ACETONIDE 0.5 % EX OINT: 1.0000 "application " | TOPICAL_OINTMENT | Freq: Two times a day (BID) | CUTANEOUS | Status: DC

## 2015-09-13 MED ORDER — CETIRIZINE HCL 10 MG PO TABS: 10.0000 mg | ORAL_TABLET | Freq: Every day | ORAL | Status: DC

## 2015-09-13 MED ORDER — SUMATRIPTAN SUCCINATE 100 MG PO TABS: 100.0000 mg | ORAL_TABLET | ORAL | Status: DC | PRN

## 2015-09-13 NOTE — Assessment & Plan Note (Signed)
A; asthma w/o exacerbation P: Refilled meds per orders

## 2015-09-13 NOTE — Progress Notes (Signed)
Depression screen [facility] 2/9 09/13/2015 04/09/2015 12/15/2014  Decreased Interest 3 0 0  Down, Depressed, Hopeless 1 0 0  PHQ - 2 Score 4 0 0  Altered sleeping 1 - -  Tired, decreased energy 1 - -  Change in appetite 1 - -  Feeling bad or failure about yourself  0 - -  Trouble concentrating 0 - -  Moving slowly or fidgety/restless 0 - -  Suicidal thoughts 1 - -  PHQ-9 Score 8 - -  *No plans to end life in last 2 weeks  GAD 7 : Generalized Anxiety Score 09/13/2015  Nervous, Anxious, on Edge 0  Control/stop worrying 1  Worry too much - different things 2  Trouble relaxing 2  Restless 0  Easily annoyed or irritable 1  Afraid - awful might happen 2  Total GAD 7 Score 8

## 2015-09-13 NOTE — Assessment & Plan Note (Signed)
A; chronic migraine P Vision screen imitrex for abortive therapy pamelor q HS for preventative therapy

## 2015-09-13 NOTE — Progress Notes (Signed)
Subjective:  Patient ID: Adrian [name], male    DOB: 1976/03/28  Age: 40 y.o. MRN: [id number]  CC: No chief complaint on file.   HPI Adrian [name] presents for    1. Headache: still having headaches. Coming and going. Located on the top of his head and behind his eyes. He went to ED last month with report of fall and head trauma. He does not remember having a fall or head trauma. He has normal CT head neck. Headaches last a couple of days. He has dizziness with headache. He is prone to headaches. He started having them in middle school. He was on imitrex in the past. imitrex in the past which helped. He has headache 4 days a week. When he has headache he has sensitivity to light. No smoking, THC, ETOH or street drugs. He has rare chocolate. High salt diet. He sleeps 6 hrs per night.   2. Cyst: under skin. Multiple. Some are tender. No skin rash or skin changes.   3. Asthma: did not take albuterol, zyrtec or flonase. Has nosebleed with flonase. Having coughing fits. A little CP and SOB. No fever or chills. No sick contacts.   Social History  Substance Use Topics  . Smoking status: Former Smoker    Types: Cigars    Quit date: 09/13/2012  . Smokeless tobacco: Not on file  . Alcohol Use: No    Outpatient Prescriptions Prior to Visit  Medication Sig Dispense Refill  . albuterol (PROVENTIL HFA;VENTOLIN HFA) 108 (90 BASE) MCG/ACT inhaler Inhale 2 puffs into the lungs every 6 (six) hours as needed for wheezing or shortness of breath. 1 Inhaler 3  . cetirizine (ZYRTEC) 10 MG tablet Take 1 tablet (10 mg total) by mouth daily. 30 tablet 11  . fluticasone (FLONASE) 50 MCG/ACT nasal spray Place 2 sprays into both nostrils daily. 16 g 6  . ibuprofen (ADVIL,MOTRIN) 200 MG tablet Take 400 mg by mouth every 6 (six) hours as needed for moderate pain.    Marland Kitchen ondansetron (ZOFRAN-ODT) 8 MG disintegrating tablet Take 1 tablet (8 mg total) by mouth every 8 (eight) hours as needed for nausea or vomiting. 10  tablet 0   No facility-administered medications prior to visit.    ROS Review of Systems  Constitutional: Positive for appetite change. Negative for fever, chills, fatigue and unexpected weight change.  Eyes: Negative for visual disturbance.  Respiratory: Negative for cough and shortness of breath.   Cardiovascular: Negative for chest pain, palpitations and leg swelling.  Gastrointestinal: Positive for nausea. Negative for vomiting, abdominal pain, diarrhea, constipation and blood in stool.  Endocrine: Negative for polydipsia, polyphagia and polyuria.  Musculoskeletal: Negative for myalgias, back pain, arthralgias, gait problem and neck pain.  Skin: Positive for rash (hyperpigmented xerotic rash on hands ).  Allergic/Immunologic: Negative for immunocompromised state.  Neurological: Positive for headaches.  Hematological: Negative for adenopathy. Does not bruise/bleed easily.  Psychiatric/Behavioral: Negative for suicidal ideas, sleep disturbance and dysphoric mood. The patient is not nervous/anxious.     Objective:  BP 121/67 mmHg  Pulse 58  Temp(Src) 97.9 F (36.6 C) (Oral)  Resp 16  Ht 5\' 7"  (1.702 m)  Wt 211 lb (95.709 kg)  BMI 33.04 kg/m2  SpO2 99%  BP/Weight 09/13/2015 [id number]  Systolic BP 123XX123 [id number]  Diastolic BP 67 90 51  Wt. (Lbs) 211 204 -  BMI 33.04 28.46 -   Physical Exam  Constitutional: He appears well-developed and well-nourished. No distress.  HENT:  Head:  Normocephalic and atraumatic.  Nose: Mucosal edema present.  Eyes: Conjunctivae and EOM are normal.  Neck: Normal range of motion. Neck supple.  Cardiovascular: Normal rate, regular rhythm, normal heart sounds and intact distal pulses.   Pulmonary/Chest: Effort normal and breath sounds normal.  Musculoskeletal: He exhibits no edema.  Neurological: He is alert.  Skin: Skin is warm and dry. No rash noted. No erythema.  Psychiatric: He has a normal mood and affect.     Assessment & Plan:    Adrian [name] was seen today for headache and cyst.  Diagnoses and all orders for this visit:  Asthma in adult, mild intermittent, uncomplicated -     albuterol (PROVENTIL HFA;VENTOLIN HFA) 108 (90 Base) MCG/ACT inhaler; Inhale 2 puffs into the lungs every 6 (six) hours as needed for wheezing or shortness of breath. -     cetirizine (ZYRTEC) 10 MG tablet; Take 1 tablet (10 mg total) by mouth daily.  Allergic rhinitis, unspecified allergic rhinitis type -     fluticasone (FLONASE) 50 MCG/ACT nasal spray; Place 2 sprays into both nostrils daily.  Chronic migraine -     SUMAtriptan (IMITREX) 100 MG tablet; Take 1 tablet (100 mg total) by mouth every 2 (two) hours as needed for migraine. May repeat in 2 hours if headache persists or recurs. -     nortriptyline (PAMELOR) 25 MG capsule; Take 1 capsule (25 mg total) by mouth at bedtime.  Eczema of both hands -     triamcinolone ointment (KENALOG) 0.5 %; Apply 1 application topically 2 (two) times daily.   Meds ordered this encounter  Medications  . albuterol (PROVENTIL HFA;VENTOLIN HFA) 108 (90 Base) MCG/ACT inhaler    Sig: Inhale 2 puffs into the lungs every 6 (six) hours as needed for wheezing or shortness of breath.    Dispense:  1 Inhaler    Refill:  3  . cetirizine (ZYRTEC) 10 MG tablet    Sig: Take 1 tablet (10 mg total) by mouth daily.    Dispense:  30 tablet    Refill:  11  . fluticasone (FLONASE) 50 MCG/ACT nasal spray    Sig: Place 2 sprays into both nostrils daily.    Dispense:  16 g    Refill:  6  . SUMAtriptan (IMITREX) 100 MG tablet    Sig: Take 1 tablet (100 mg total) by mouth every 2 (two) hours as needed for migraine. May repeat in 2 hours if headache persists or recurs.    Dispense:  10 tablet    Refill:  0  . triamcinolone ointment (KENALOG) 0.5 %    Sig: Apply 1 application topically 2 (two) times daily.    Dispense:  30 g    Refill:  2  . nortriptyline (PAMELOR) 25 MG capsule    Sig: Take 1 capsule (25 mg total) by  mouth at bedtime.    Dispense:  30 capsule    Refill:  1    Follow-up: No Follow-up on file.   Boykin Nearing MD

## 2015-09-13 NOTE — Assessment & Plan Note (Signed)
Benign skin lipomas/fibromas Reassurance

## 2015-09-13 NOTE — Progress Notes (Signed)
HFU  Dizziness, HA Complaining of possible cyst on torso  Pain scale #8 No tobacco user  No suicidal thoughts in the past two weeks

## 2015-09-13 NOTE — Patient Instructions (Signed)
Adrian [name] was seen today for headache and cyst.  Diagnoses and all orders for this visit:  Asthma in adult, mild intermittent, uncomplicated -     albuterol (PROVENTIL HFA;VENTOLIN HFA) 108 (90 Base) MCG/ACT inhaler; Inhale 2 puffs into the lungs every 6 (six) hours as needed for wheezing or shortness of breath. -     cetirizine (ZYRTEC) 10 MG tablet; Take 1 tablet (10 mg total) by mouth daily.  Allergic rhinitis, unspecified allergic rhinitis type -     fluticasone (FLONASE) 50 MCG/ACT nasal spray; Place 2 sprays into both nostrils daily.  Chronic migraine -     SUMAtriptan (IMITREX) 100 MG tablet; Take 1 tablet (100 mg total) by mouth every 2 (two) hours as needed for migraine. May repeat in 2 hours if headache persists or recurs. -     nortriptyline (PAMELOR) 25 MG capsule; Take 1 capsule (25 mg total) by mouth at bedtime.  Eczema of both hands -     triamcinolone ointment (KENALOG) 0.5 %; Apply 1 application topically 2 (two) times daily.   F/u in 6 weeks for headaches, keep headache diary to keep track of symptoms  Dr. Adrian Blackwater   Migraine Headache A migraine headache is an intense, throbbing pain on one or both sides of your head. A migraine can last for 30 minutes to several hours. CAUSES  The exact cause of a migraine headache is not always known. However, a migraine may be caused when nerves in the brain become irritated and release chemicals that cause inflammation. This causes pain. Certain things may also trigger migraines, such as:  Alcohol.  Smoking.  Stress.  Menstruation.  Aged cheeses.  Foods or drinks that contain nitrates, glutamate, aspartame, or tyramine.  Lack of sleep.  Chocolate.  Caffeine.  Hunger.  Physical exertion.  Fatigue.  Medicines used to treat chest pain (nitroglycerine), birth control pills, estrogen, and some blood pressure medicines. SIGNS AND SYMPTOMS  Pain on one or both sides of your head.  Pulsating or throbbing  pain.  Severe pain that prevents daily activities.  Pain that is aggravated by any physical activity.  Nausea, vomiting, or both.  Dizziness.  Pain with exposure to bright lights, loud noises, or activity.  General sensitivity to bright lights, loud noises, or smells. Before you get a migraine, you may get warning signs that a migraine is coming (aura). An aura may include:  Seeing flashing lights.  Seeing bright spots, halos, or zigzag lines.  Having tunnel vision or blurred vision.  Having feelings of numbness or tingling.  Having trouble talking.  Having muscle weakness. DIAGNOSIS  A migraine headache is often diagnosed based on:  Symptoms.  Physical exam.  A CT scan or MRI of your head. These imaging tests cannot diagnose migraines, but they can help rule out other causes of headaches. TREATMENT Medicines may be given for pain and nausea. Medicines can also be given to help prevent recurrent migraines.  HOME CARE INSTRUCTIONS  Only take over-the-counter or prescription medicines for pain or discomfort as directed by your health care provider. The use of long-term narcotics is not recommended.  Lie down in a dark, quiet room when you have a migraine.  Keep a journal to find out what may trigger your migraine headaches. For example, write down:  What you eat and drink.  How much sleep you get.  Any change to your diet or medicines.  Limit alcohol consumption.  Quit smoking if you smoke.  Get 7-9 hours of sleep, or  as recommended by your health care provider.  Limit stress.  Keep lights dim if bright lights bother you and make your migraines worse. SEEK IMMEDIATE MEDICAL CARE IF:   Your migraine becomes severe.  You have a fever.  You have a stiff neck.  You have vision loss.  You have muscular weakness or loss of muscle control.  You start losing your balance or have trouble walking.  You feel faint or pass out.  You have severe symptoms  that are different from your first symptoms. MAKE SURE YOU:   Understand these instructions.  Will watch your condition.  Will get help right away if you are not doing well or get worse.   This information is not intended to replace advice given to you by your health care provider. Make sure you discuss any questions you have with your health care provider.   Document Released: 06/16/2005 Document Revised: 07/07/2014 Document Reviewed: 02/21/2013 Elsevier Interactive Patient Education [employer].

## 2015-09-14 MED FILL — TRIAMCINOLONE 0.5% OINTMENT: 0.5 | 30 days supply | Qty: 30 | Fill #0

## 2015-09-14 MED FILL — ?CETIRIZINE HCL 10 MG TABLE: 10 | 30 days supply | Qty: 30 | Fill #0

## 2015-09-14 MED FILL — SUMATRIPTAN SUCC 100 MG TAB: 100 | 30 days supply | Qty: 9 | Fill #0

## 2015-09-14 MED FILL — !VENTOLIN HFA INHALER: 108 (90 BAS | 25 days supply | Qty: 18 | Fill #0

## 2015-09-14 MED FILL — NORTRIPTYLINE HCL 25 MG CAP: 25 | 30 days supply | Qty: 30 | Fill #0

## 2015-11-15 ENCOUNTER — Encounter: Payer: Self-pay | Admitting: Family Medicine

## 2015-11-15 ENCOUNTER — Encounter (INDEPENDENT_AMBULATORY_CARE_PROVIDER_SITE_OTHER): Payer: Self-pay

## 2015-11-15 ENCOUNTER — Ambulatory Visit: Payer: Self-pay | Attending: Family Medicine | Admitting: Family Medicine

## 2015-11-15 VITALS — BP 122/74 | HR 60 | Temp 98.1°F | Resp 20 | Ht 71.0 in | Wt 211.2 lb

## 2015-11-15 DIAGNOSIS — J4520 Mild intermittent asthma, uncomplicated: Secondary | ICD-10-CM

## 2015-11-15 MED ORDER — RANITIDINE HCL 300 MG PO TABS: 300.0000 mg | ORAL_TABLET | Freq: Every day | ORAL | Status: DC

## 2015-11-15 MED ORDER — ALBUTEROL SULFATE HFA 108 (90 BASE) MCG/ACT IN AERS: 2.0000 | INHALATION_SPRAY | Freq: Four times a day (QID) | RESPIRATORY_TRACT | Status: DC | PRN

## 2015-11-15 MED ORDER — FLUTICASONE PROPIONATE HFA 44 MCG/ACT IN AERO: 2.0000 | INHALATION_SPRAY | Freq: Two times a day (BID) | RESPIRATORY_TRACT | Status: DC

## 2015-11-15 MED FILL — raNITIdine HCL 150 MG TABS: 150 | 30 days supply | Qty: 60 | Fill #0

## 2015-11-15 MED FILL — !PROVENTIL HFA 90 MCG INH: 108 (90 BAS | 1 days supply | Qty: 1 | Fill #0

## 2015-11-15 MED FILL — !FLOVENT HFA 44 MCG INHALER: 44 MCG | 28 days supply | Qty: 1 | Fill #0

## 2015-11-15 NOTE — Assessment & Plan Note (Signed)
Asthma with persistent symptoms  Plan; Refilled albuterol Add flovent as a controller inhaler Zyrtec and flonase daily Add zantac to control GERD

## 2015-11-15 NOTE — Patient Instructions (Addendum)
Adrian [name] was seen today for asthma.  Diagnoses and all orders for this visit:  Asthma in adult, mild intermittent, uncomplicated -     fluticasone (FLOVENT HFA) 44 MCG/ACT inhaler; Inhale 2 puffs into the lungs 2 (two) times daily. -     albuterol (PROVENTIL HFA;VENTOLIN HFA) 108 (90 Base) MCG/ACT inhaler; Inhale 2 puffs into the lungs every 6 (six) hours as needed for wheezing or shortness of breath. -     ranitidine (ZANTAC) 300 MG tablet; Take 1 tablet (300 mg total) by mouth at bedtime.    F/u in 3-4 weeks for hand and leg pain  Dr. Adrian Blackwater

## 2015-11-15 NOTE — Progress Notes (Signed)
Subjective:  Patient ID: Adrian [name], male    DOB: 05-Feb-1976  Age: 40 y.o. MRN: [id number]  CC: Asthma   HPI Adrian [name] presents for  Asthma   1. Asthma: he is using his son's albuterol. He is not smoking. He has SOB every night. He admits to reflux. He denies CP, SOB or wheezing. No fever or chills. He last used albuterol this AM.    He has no suicidal ideation  Social History  Substance Use Topics  . Smoking status: Former Smoker    Types: Cigars    Quit date: 09/13/2012  . Smokeless tobacco: Not on file  . Alcohol Use: No    Outpatient Prescriptions Prior to Visit  Medication Sig Dispense Refill  . albuterol (PROVENTIL HFA;VENTOLIN HFA) 108 (90 Base) MCG/ACT inhaler Inhale 2 puffs into the lungs every 6 (six) hours as needed for wheezing or shortness of breath. 1 Inhaler 3  . cetirizine (ZYRTEC) 10 MG tablet Take 1 tablet (10 mg total) by mouth daily. 30 tablet 11  . fluticasone (FLONASE) 50 MCG/ACT nasal spray Place 2 sprays into both nostrils daily. 16 g 6  . ibuprofen (ADVIL,MOTRIN) 200 MG tablet Take 400 mg by mouth every 6 (six) hours as needed for moderate pain.    . nortriptyline (PAMELOR) 25 MG capsule Take 1 capsule (25 mg total) by mouth at bedtime. 30 capsule 1  . SUMAtriptan (IMITREX) 100 MG tablet Take 1 tablet (100 mg total) by mouth every 2 (two) hours as needed for migraine. May repeat in 2 hours if headache persists or recurs. 10 tablet 0  . triamcinolone ointment (KENALOG) 0.5 % Apply 1 application topically 2 (two) times daily. 30 g 2   No facility-administered medications prior to visit.    ROS Review of Systems  Constitutional: Negative for fever, chills, fatigue and unexpected weight change.  Eyes: Negative for visual disturbance.  Respiratory: Positive for shortness of breath (at night ) and wheezing. Negative for cough.   Cardiovascular: Negative for chest pain, palpitations and leg swelling.  Gastrointestinal: Negative for nausea,  vomiting, abdominal pain, diarrhea, constipation and blood in stool.  Endocrine: Negative for polydipsia, polyphagia and polyuria.  Musculoskeletal: Negative for myalgias, back pain, arthralgias, gait problem and neck pain.  Skin: Negative for rash.  Allergic/Immunologic: Negative for immunocompromised state.  Hematological: Negative for adenopathy. Does not bruise/bleed easily.  Psychiatric/Behavioral: Negative for suicidal ideas, sleep disturbance and dysphoric mood. The patient is not nervous/anxious.     Objective:  BP 122/74 mmHg  Pulse 60  Temp(Src) 98.1 F (36.7 C) (Oral)  Resp 20  Ht 5\' 11"  (1.803 m)  Wt 211 lb 3.2 oz (95.8 kg)  BMI 29.47 kg/m2  SpO2 98%  BP/Weight 11/15/2015 [id number]  Systolic BP 123XX123 [id number] [id number]  Diastolic BP 74 67 90  Wt. (Lbs) 211.2 211 204  BMI 29.47 33.04 28.46   Physical Exam  Constitutional: He appears well-developed and well-nourished. No distress.  HENT:  Head: Normocephalic and atraumatic.  Nose: Mucosal edema (L>R) present.  Neck: Normal range of motion. Neck supple.  Cardiovascular: Normal rate, regular rhythm, normal heart sounds and intact distal pulses.   Pulmonary/Chest: Effort normal and breath sounds normal.  Musculoskeletal: He exhibits no edema.  Neurological: He is alert.  Skin: Skin is warm and dry. No rash noted. No erythema.  Psychiatric: He has a normal mood and affect.     Assessment & Plan:   There are no diagnoses linked to this encounter. [employer]  was seen today for asthma.  Diagnoses and all orders for this visit:  Asthma in adult, mild intermittent, uncomplicated -     fluticasone (FLOVENT HFA) 44 MCG/ACT inhaler; Inhale 2 puffs into the lungs 2 (two) times daily. -     albuterol (PROVENTIL HFA;VENTOLIN HFA) 108 (90 Base) MCG/ACT inhaler; Inhale 2 puffs into the lungs every 6 (six) hours as needed for wheezing or shortness of breath. -     ranitidine (ZANTAC) 300 MG tablet; Take 1 tablet (300 mg total) by  mouth at bedtime.   Meds ordered this encounter  Medications  . fluticasone (FLOVENT HFA) 44 MCG/ACT inhaler    Sig: Inhale 2 puffs into the lungs 2 (two) times daily.    Dispense:  1 Inhaler    Refill:  12  . albuterol (PROVENTIL HFA;VENTOLIN HFA) 108 (90 Base) MCG/ACT inhaler    Sig: Inhale 2 puffs into the lungs every 6 (six) hours as needed for wheezing or shortness of breath.    Dispense:  1 Inhaler    Refill:  3  . ranitidine (ZANTAC) 300 MG tablet    Sig: Take 1 tablet (300 mg total) by mouth at bedtime.    Dispense:  30 tablet    Refill:  6    Follow-up: No Follow-up on file.   Boykin Nearing MD

## 2015-12-03 MED FILL — TRIAMCINOLONE 0.5% OINTMENT: 0.5 | 30 days supply | Qty: 30 | Fill #1

## 2015-12-03 MED FILL — PROAIR HFA 90 MCG INHALER: 108 (90 BAS | 30 days supply | Qty: 9 | Fill #1

## 2016-03-31 MED FILL — VENTOLIN HFA 90 MCG INHALER: 108 (90 BAS | 25 days supply | Qty: 18 | Fill #1

## 2016-03-31 MED FILL — TRIAMCINOLONE 0.5% OINTMENT: 0.5 | 30 days supply | Qty: 30 | Fill #2

## 2016-04-06 IMAGING — CR DG HAND COMPLETE 3+V*R*
3 series · 3 of 3 positions shown · non-contrast
Comparison: None.

CLINICAL DATA: Right hand and thumb pain for 2-3 weeks, increasing.
Manual labor.

EXAM:
RIGHT HAND - COMPLETE 3+ VIEW

[hand pa]
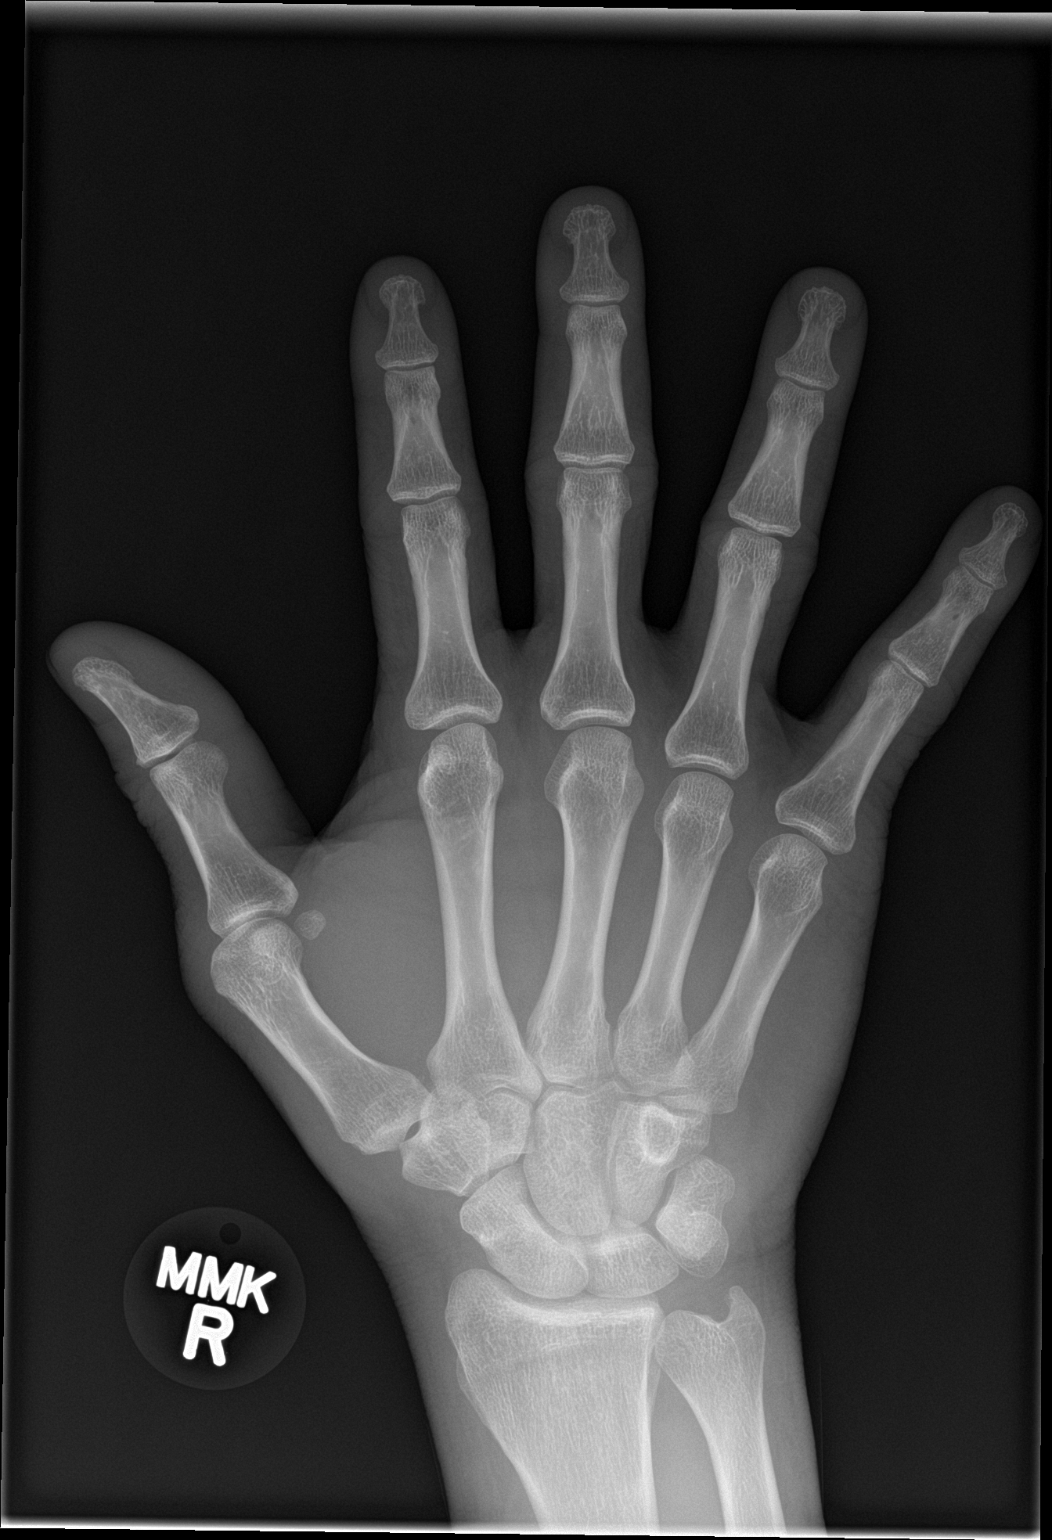

[hand obl]
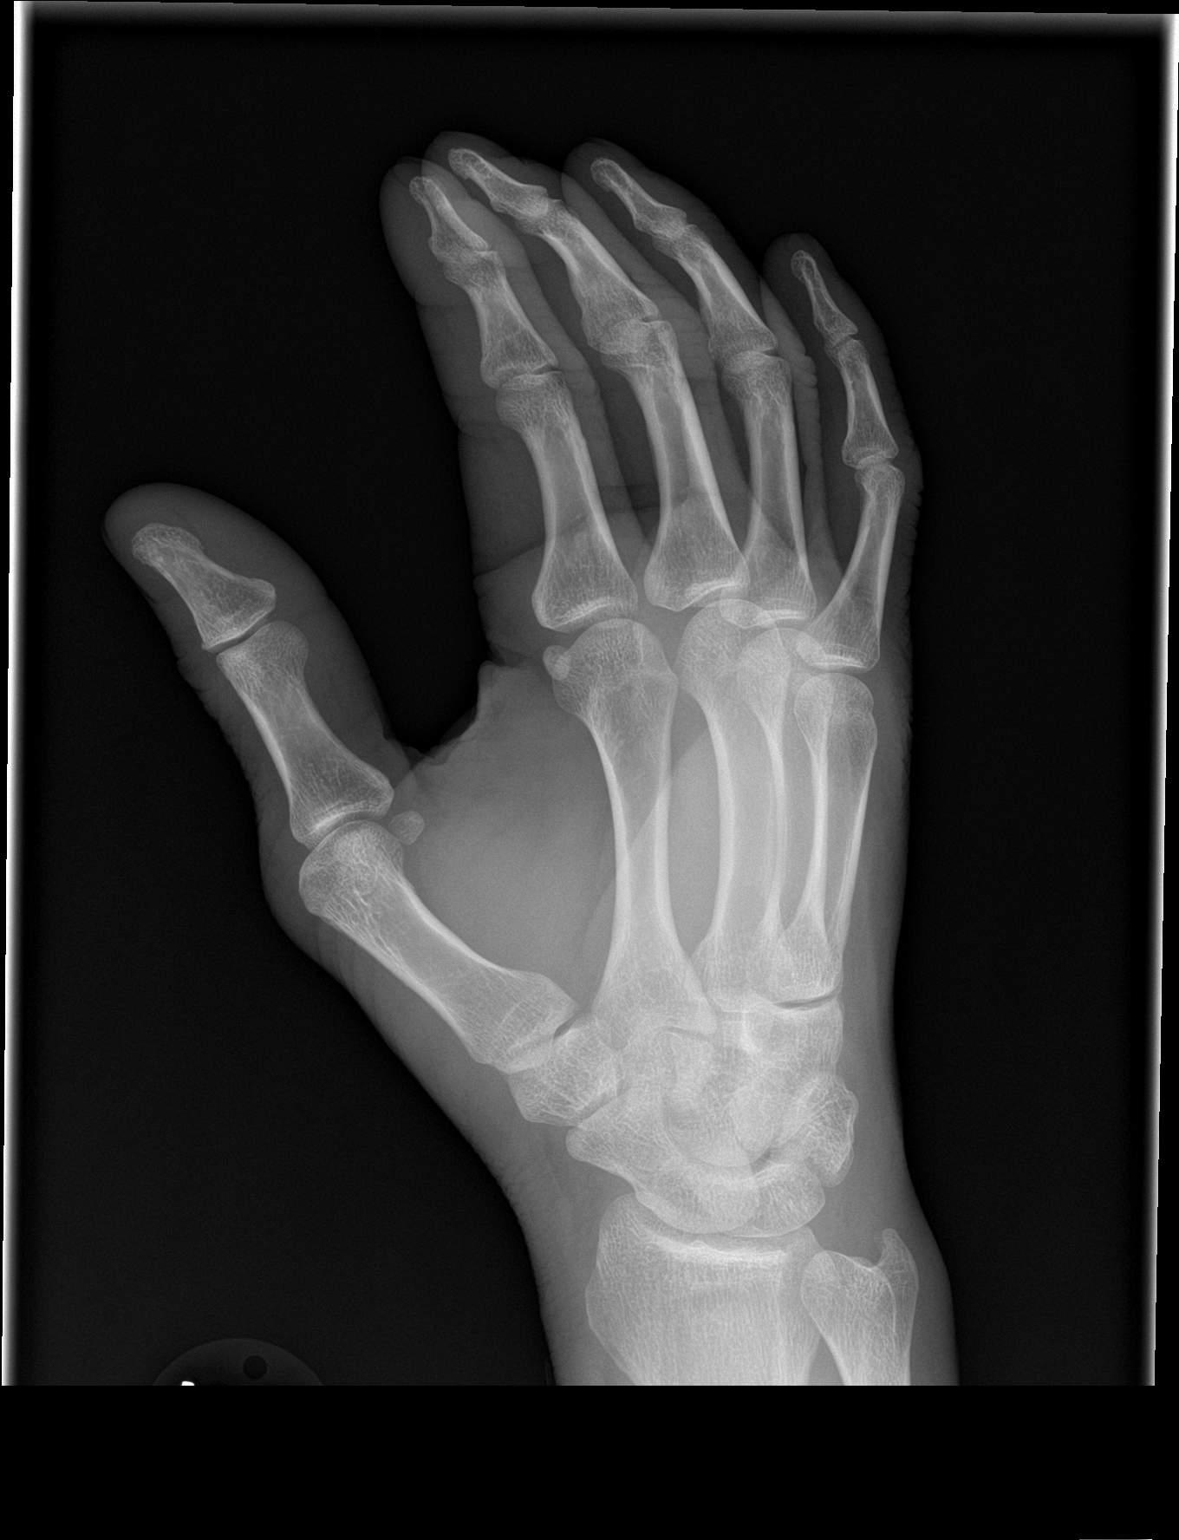

[hand lat]
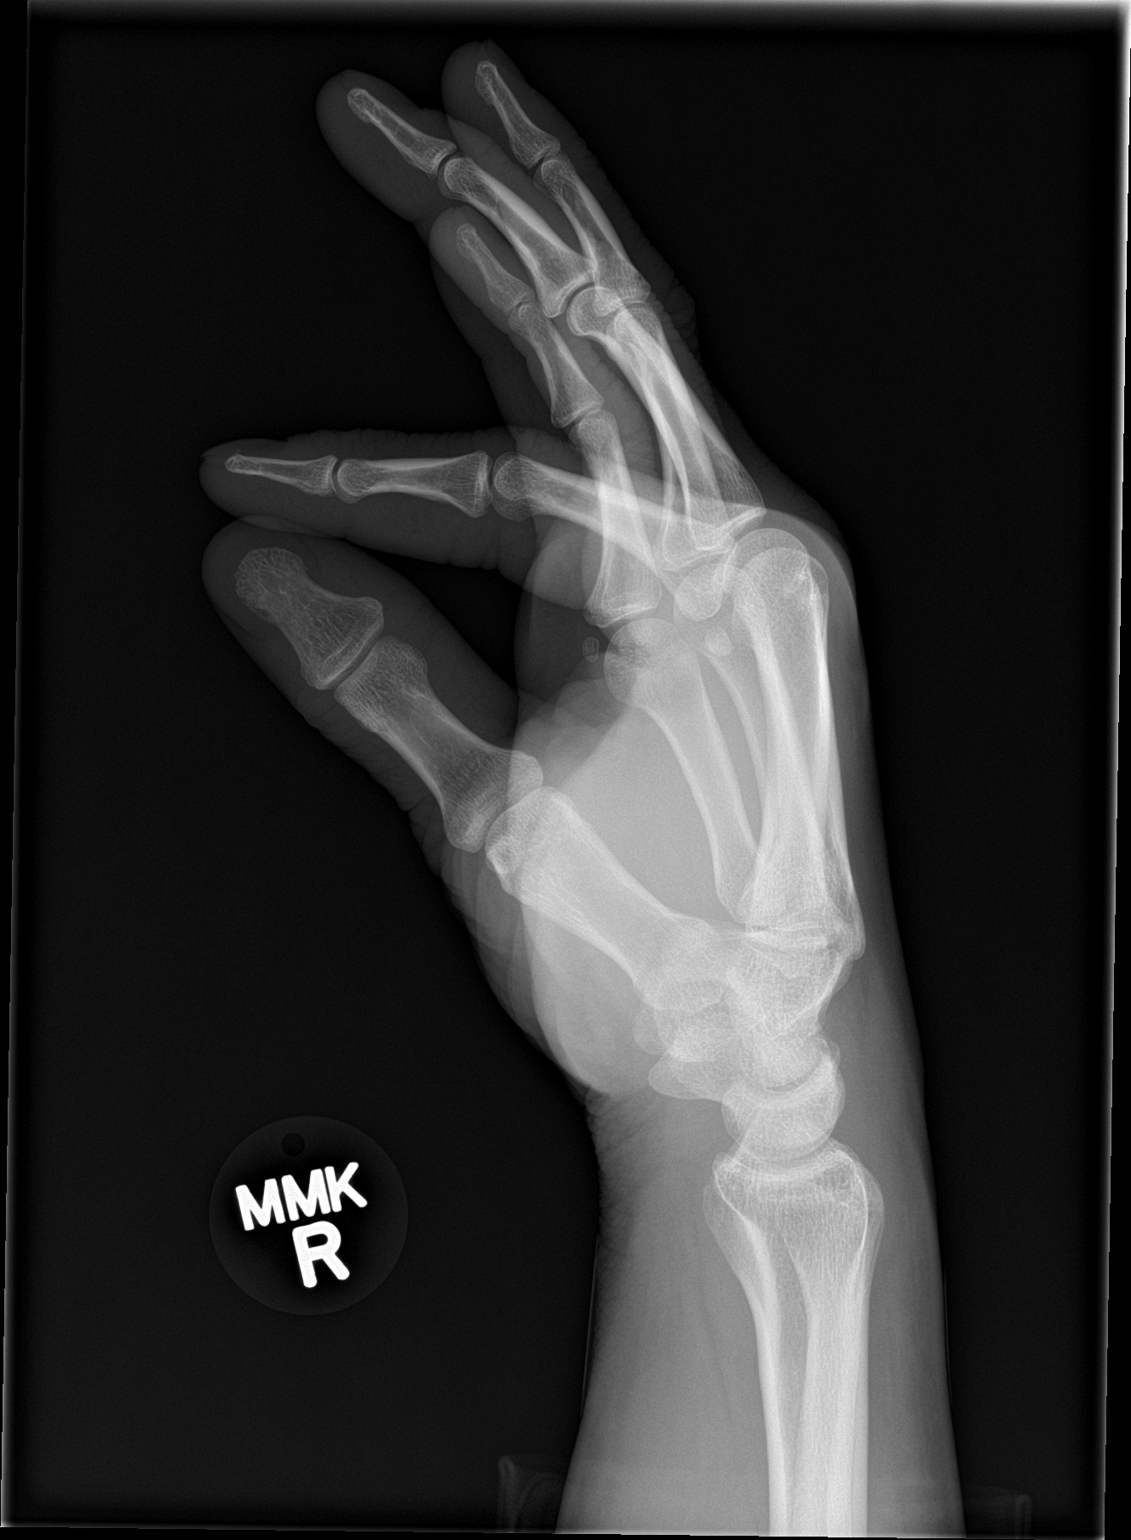

[3 of 3 positions shown; findings below may reference images not displayed]

FINDINGS: No acute osseous or joint abnormality.  No degenerative changes.
IMPRESSION: Negative.

## 2016-07-05 ENCOUNTER — Emergency Department (HOSPITAL_COMMUNITY)
Admission: EM | Admit: 2016-07-05 | Discharge: 2016-07-05 | Disposition: A | Discharge status: Home / Self Care | Payer: Self-pay | Attending: Emergency Medicine | Admitting: Emergency Medicine

## 2016-07-05 ENCOUNTER — Emergency Department (HOSPITAL_COMMUNITY): Payer: Self-pay

## 2016-07-05 ENCOUNTER — Encounter (HOSPITAL_COMMUNITY): Payer: Self-pay | Admitting: *Deleted

## 2016-07-05 DIAGNOSIS — S6991XA Unspecified injury of right wrist, hand and finger(s), initial encounter: Secondary | ICD-10-CM | POA: Insufficient documentation

## 2016-07-05 DIAGNOSIS — W06XXXA Fall from bed, initial encounter: Secondary | ICD-10-CM | POA: Insufficient documentation

## 2016-07-05 DIAGNOSIS — Y999 Unspecified external cause status: Secondary | ICD-10-CM | POA: Insufficient documentation

## 2016-07-05 DIAGNOSIS — J45909 Unspecified asthma, uncomplicated: Secondary | ICD-10-CM | POA: Insufficient documentation

## 2016-07-05 DIAGNOSIS — Z91010 Allergy to peanuts: Secondary | ICD-10-CM | POA: Insufficient documentation

## 2016-07-05 DIAGNOSIS — Y929 Unspecified place or not applicable: Secondary | ICD-10-CM | POA: Insufficient documentation

## 2016-07-05 DIAGNOSIS — Y939 Activity, unspecified: Secondary | ICD-10-CM | POA: Insufficient documentation

## 2016-07-05 DIAGNOSIS — Z91040 Latex allergy status: Secondary | ICD-10-CM | POA: Insufficient documentation

## 2016-07-05 DIAGNOSIS — Z87891 Personal history of nicotine dependence: Secondary | ICD-10-CM | POA: Insufficient documentation

## 2016-07-05 NOTE — Discharge Instructions (Signed)
There is no fracture or dislocation noted on x-ray. This may indicate a sprain. The biggest factor in healing for an injury of this type is time. Keep the splint on for support and protection, as needed. Ibuprofen or naproxen to reduce pain and inflammation. Applying ice and keeping the extremity elevated may also reduce inflammation.

## 2016-07-05 NOTE — ED Provider Notes (Signed)
[facility] DEPT Provider Note   CSN: [id number] Arrival date & time: 07/05/16  0757     History   Chief Complaint Chief Complaint  Patient presents with  . Hand Injury    HPI Adrian [name] is a 41 y.o. male.  HPI   Adrian [name] is a 41 y.o. male, patient with no pertinent past medical history, presenting to the ED with Right hand injury that occurred a week ago. Patient states he fell out of bed onto his right hand. He has pain over the dorsal right hand. Pain is moderate, throbbing, nonradiating. Patient has not tried any treatments prior to arrival. Denies neuro deficits, wrist pain, or any other injuries or complaints.      Past Medical History:  Diagnosis Date  . 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 Eczema   . Environmental allergies    grass  . Foot fracture, left   . Heart murmur   . Migraine   . Multiple food allergies    fish, tomatoes, peanuts, eggs, chicken and others    Patient Active Problem List   Diagnosis Date Noted  . Chronic migraine 09/13/2015  . Allergic rhinitis 04/09/2015  . Poor vision 04/09/2015  . Bilateral wrist pain 12/15/2014  . Pain, dental 05/19/2012  . Lipoma 01/25/2012  . Asthma in adult 01/13/2012  . Eczema, dyshidrotic 01/13/2012    Past Surgical History:  Procedure Laterality Date  . INGUINAL HERNIA REPAIR  1994       Home Medications    Prior to Admission medications   Medication Sig Start Date End Date Taking? Authorizing Provider  albuterol (PROVENTIL HFA;VENTOLIN HFA) 108 (90 Base) MCG/ACT inhaler Inhale 2 puffs into the lungs every 6 (six) hours as needed for wheezing or shortness of breath. 11/15/15   Josalyn Funches, MD  cetirizine (ZYRTEC) 10 MG tablet Take 1 tablet (10 mg total) by mouth daily. 09/13/15   Josalyn Funches, MD  fluticasone (FLONASE) 50 MCG/ACT nasal spray Place 2 sprays into both nostrils daily. 09/13/15   Josalyn Funches, MD  fluticasone (FLOVENT HFA) 44 MCG/ACT inhaler Inhale 2 puffs into the lungs 2  (two) times daily. 11/15/15   Josalyn Funches, MD  ibuprofen (ADVIL,MOTRIN) 200 MG tablet Take 400 mg by mouth every 6 (six) hours as needed for moderate pain.    Historical Provider, MD  nortriptyline (PAMELOR) 25 MG capsule Take 1 capsule (25 mg total) by mouth at bedtime. 09/13/15   Josalyn Funches, MD  ranitidine (ZANTAC) 300 MG tablet Take 1 tablet (300 mg total) by mouth at bedtime. 11/15/15   Josalyn Funches, MD  SUMAtriptan (IMITREX) 100 MG tablet Take 1 tablet (100 mg total) by mouth every 2 (two) hours as needed for migraine. May repeat in 2 hours if headache persists or recurs. 09/13/15   Josalyn Funches, MD  triamcinolone ointment (KENALOG) 0.5 % Apply 1 application topically 2 (two) times daily. 09/13/15   Boykin Nearing, MD    Family History Family History  Problem Relation Age of Onset  . Asthma Father   . Diabetes Mother   . Hypertension Mother   . Clotting disorder      Aunt  . Breast cancer      Grandmother  . Prostate cancer      uncle  . Hyperlipidemia      grandparent  . Hypertension      grandparent/grandparent  . Kidney disease      aunt/uncle/grandparent  . Stroke      grandparent  Social History Social History  Substance Use Topics  . Smoking status: Former Smoker    Types: Cigars    Quit date: 09/13/2012  . Smokeless tobacco: Never Used  . Alcohol use No     Allergies   Atrovent [ipratropium]; Peanut-containing drug products; Shellfish allergy; Tomato; and Latex   Review of Systems Review of Systems  Neurological: Negative for weakness and numbness.     Physical Exam Updated Vital Signs BP 127/65 (BP Location: Left Arm)   Pulse 62   Temp 97.9 F (36.6 C) (Oral)   Resp 20   Wt 101.6 kg   SpO2 99%   BMI 31.24 kg/m   Physical Exam  Constitutional: He appears well-developed and well-nourished. No distress.  HENT:  Head: Normocephalic and atraumatic.  Eyes: Conjunctivae are normal.  Neck: Neck supple.  Cardiovascular: Normal rate,  regular rhythm and intact distal pulses.   Pulmonary/Chest: Effort normal.  Musculoskeletal: He exhibits edema and tenderness.  Swelling and tenderness over the dorsal right hand, centered around the third and fourth MCP joints. Full range of motion against resistance in the right hand and fingers.  Neurological: He is alert.  No sensory deficits in the right hand or fingers. Grip strength 5 out of 5 bilaterally.  Skin: Skin is warm and dry. He is not diaphoretic.  Psychiatric: He has a normal mood and affect. His behavior is normal.  Nursing note and vitals reviewed.    ED Treatments / Results  Labs (all labs ordered are listed, but only abnormal results are displayed) Labs Reviewed - No data to display  EKG  EKG Interpretation None       Radiology Dg Hand Complete Right  Result Date: 07/05/2016 CLINICAL DATA:  Hand injury 1 week ago with right third and fourth MCP swelling. Initial encounter. EXAM: RIGHT HAND - COMPLETE 3+ VIEW COMPARISON:  11/22/2014 FINDINGS: There is no evidence of fracture or dislocation. There is no evidence of arthropathy or other focal bone abnormality. Soft tissues are unremarkable. IMPRESSION: Negative. Electronically Signed   By: Monte Fantasia M.D.   On: 07/05/2016 09:04    Procedures Procedures (including critical care time)  Medications Ordered in ED Medications - No data to display   Initial Impression / Assessment and Plan / ED Course  I have reviewed the triage vital signs and the nursing notes.  Pertinent labs & imaging results that were available during my care of the patient were reviewed by me and considered in my medical decision making (see chart for details).  Clinical Course     Patient presents with a right hand injury that occurred a week ago. No fracture or dislocation on x-ray. Symptomatic care discussed.     Final Clinical Impressions(s) / ED Diagnoses   Final diagnoses:  Injury of right hand, initial encounter     New Prescriptions New Prescriptions   No medications on file     Layla Maw 07/05/16 [place], MD 07/05/16 [phone]

## 2016-07-05 NOTE — ED Triage Notes (Signed)
Pt states hurt R hand when he fell out of bed 1 week ago.

## 2016-07-05 NOTE — ED Notes (Signed)
Declined W/C at D/C and was escorted to lobby by RN. 

## 2016-08-04 ENCOUNTER — Other Ambulatory Visit: Payer: Self-pay | Admitting: Family Medicine

## 2016-08-04 DIAGNOSIS — L309 Dermatitis, unspecified: Secondary | ICD-10-CM

## 2016-08-13 ENCOUNTER — Ambulatory Visit: Payer: Self-pay | Admitting: Physician Assistant

## 2016-08-15 ENCOUNTER — Encounter: Payer: Self-pay | Admitting: Physician Assistant

## 2016-08-15 ENCOUNTER — Ambulatory Visit: Payer: Self-pay | Attending: Internal Medicine | Admitting: Physician Assistant

## 2016-08-15 VITALS — BP 114/72 | HR 84 | Temp 97.9°F | Resp 16 | Wt 223.6 lb

## 2016-08-15 DIAGNOSIS — L301 Dyshidrosis [pompholyx]: Secondary | ICD-10-CM | POA: Insufficient documentation

## 2016-08-15 DIAGNOSIS — L309 Dermatitis, unspecified: Secondary | ICD-10-CM

## 2016-08-15 DIAGNOSIS — J4520 Mild intermittent asthma, uncomplicated: Secondary | ICD-10-CM | POA: Insufficient documentation

## 2016-08-15 MED ORDER — TRIAMCINOLONE ACETONIDE 0.5 % EX OINT: TOPICAL_OINTMENT | Freq: Two times a day (BID) | CUTANEOUS | 3 refills | Status: DC

## 2016-08-15 MED ORDER — PREDNISONE 10 MG PO TABS: ORAL_TABLET | ORAL | 0 refills | Status: DC

## 2016-08-15 MED FILL — predniSONE 10 MG TABS: 10 | 6 days supply | Qty: 21 | Fill #0

## 2016-08-15 MED FILL — TRIAMCINOLONE 0.5% OINTMENT: 0.5 | 30 days supply | Qty: 75 | Fill #0

## 2016-08-15 NOTE — Progress Notes (Signed)
Adrian [name], is a 41 y.o. male  [id number]  [id number]  DOB - 24-Dec-1975  Subjective:  Chief Complaint and HPI: Adrian [name] is a 41 y.o. male here today with recurrent hand rash.  He has had this on and off most of his adult life.  It gets better with steroid creams then worsens again when he runs out.  He has been putting his hands in wet gloves bc it provides temporary relief.  He has also used sand paper on his hands due to the itching and thickened skin.   Also c/o sore area in his gums.    ROS:   Constitutional:  No f/c, No night sweats, No unexplained weight loss. EENT:  No vision changes, No blurry vision, No hearing changes. No mouth, throat, or ear problems.  Respiratory: No cough, No SOB Cardiac: No CP, no palpitations GI:  No abd pain, No N/V/D. GU: No Urinary s/sx Musculoskeletal: No joint pain Neuro: No headache, no dizziness, no motor weakness.  Skin: +rash Endocrine:  No polydipsia. No polyuria.  Psych: Denies SI/HI  No problems updated.  ALLERGIES: Allergies  Allergen Reactions  . Atrovent [Ipratropium] Anaphylaxis    Atrovent contains peanut oil and PT has a HX of anaphlaxis to peanuts  . Peanut-Containing Drug Products Anaphylaxis, Hives, Nausea And Vomiting and Swelling  . Shellfish Allergy Anaphylaxis, Hives, Swelling and Other (See Comments)    "all seafood'  . Tomato Anaphylaxis, Hives and Swelling  . Latex Itching    PAST MEDICAL HISTORY: Past Medical History:  Diagnosis Date  . 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 Eczema   . Environmental allergies    grass  . Foot fracture, left   . Heart murmur   . Migraine   . Multiple food allergies    fish, tomatoes, peanuts, eggs, chicken and others    MEDICATIONS AT HOME: Prior to Admission medications   Medication Sig Start Date End Date Taking? Authorizing Provider  albuterol (PROVENTIL HFA;VENTOLIN HFA) 108 (90 Base) MCG/ACT inhaler Inhale 2 puffs into the lungs every 6 (six) hours as needed  for wheezing or shortness of breath. 11/15/15   Adrian Funches, MD  cetirizine (ZYRTEC) 10 MG tablet Take 1 tablet (10 mg total) by mouth daily. 09/13/15   Adrian Funches, MD  fluticasone (FLONASE) 50 MCG/ACT nasal spray Place 2 sprays into both nostrils daily. 09/13/15   Adrian Funches, MD  fluticasone (FLOVENT HFA) 44 MCG/ACT inhaler Inhale 2 puffs into the lungs 2 (two) times daily. 11/15/15   Adrian Funches, MD  ibuprofen (ADVIL,MOTRIN) 200 MG tablet Take 400 mg by mouth every 6 (six) hours as needed for moderate pain.    Historical Provider, MD  nortriptyline (PAMELOR) 25 MG capsule Take 1 capsule (25 mg total) by mouth at bedtime. Patient not taking: Reported on 08/15/2016 09/13/15   Adrian Nearing, MD  predniSONE (DELTASONE) 10 MG tablet 6,5,4,3,2,1 08/15/16   Adrian Donovan, PA-C  ranitidine (ZANTAC) 300 MG tablet Take 1 tablet (300 mg total) by mouth at bedtime. 11/15/15   Adrian Funches, MD  SUMAtriptan (IMITREX) 100 MG tablet Take 1 tablet (100 mg total) by mouth every 2 (two) hours as needed for migraine. May repeat in 2 hours if headache persists or recurs. Patient not taking: Reported on 08/15/2016 09/13/15   Adrian Nearing, MD  triamcinolone ointment (KENALOG) 0.5 % Apply topically 2 (two) times daily. 08/15/16   Adrian Donovan, PA-C     Objective:  EXAM:   Vitals:  08/15/16 0843  BP: 114/72  Pulse: 84  Resp: 16  Temp: 97.9 F (36.6 C)  TempSrc: Oral  SpO2: 99%  Weight: 223 lb 9.6 oz (101.4 kg)    General appearance : A&OX3. NAD. Non-toxic-appearing HEENT: Atraumatic and Normocephalic.  PERRLA. EOM intact.  TM clear B. Mouth-MMM, post pharynx WNL w/o erythema, No PND. There is a small denuded area of gum around the lower posterior molar on the R. Neck: supple, no JVD. No cervical lymphadenopathy. No thyromegaly Chest/Lungs:  Breathing-non-labored, Good air entry bilaterally, breath sounds normal without rales, rhonchi, or wheezing  CVS: S1 S2 regular, no murmurs,  gallops, rubs  Extremities: Bilateral Lower Ext shows no edema, both legs are warm to touch with = pulse throughout Neurology:  CN II-XII grossly intact, Non focal.   Psych:  TP linear. J/I WNL. Normal speech. Appropriate eye contact and affect.  Skin:  Severe dishydrotic eczema B hands with blistering, lichenification and fingers.  No sign of secondary infection  Data Review No results found for: HGBA1C   Assessment & Plan   1. Eczema of both hands-severe dishydrosis with skin fissures-high risk of infection - triamcinolone ointment (KENALOG) 0.5 %; Apply topically 2 (two) times daily.  Dispense: 80 g; Refill: 3 - predniSONE (DELTASONE) 10 MG tablet; 6,5,4,3,2,1  Dispense: 21 tablet; Refill: 0 (reviewed glucose that was done less than 1 year ago and was normal) Discussed the remitting and relapsing nature of this condition at length.  He should not put his hands in gloves with water or use sand paper on his hands.  I discussed vaseline and other proper treatment measures at length  2. Mild intermittent asthma in adult without complication Stable  3.  Salt water gargles for sore area around tooth in mouth.  No sign of abscess   Patient have been counseled extensively about nutrition and exercise  Return if symptoms worsen or fail to improve.  The patient was given clear instructions to go to ER or return to medical center if symptoms don't improve, worsen or new problems develop. The patient verbalized understanding. The patient was told to call to get lab results if they haven't heard anything in the next week.     Adrian Caldron, PA-C [facility] [place], [place]   08/15/2016, 9:04 AMPatient ID: [name], male   DOB: 10-02-1975, 41 y.o.   MRN: [id number]

## 2016-09-25 ENCOUNTER — Emergency Department (HOSPITAL_COMMUNITY): Payer: Self-pay

## 2016-09-25 ENCOUNTER — Encounter (HOSPITAL_COMMUNITY): Payer: Self-pay

## 2016-09-25 ENCOUNTER — Emergency Department (HOSPITAL_COMMUNITY)
Admission: EM | Admit: 2016-09-25 | Discharge: 2016-09-25 | Disposition: A | Discharge status: Home / Self Care | Payer: Self-pay | Attending: Emergency Medicine | Admitting: Emergency Medicine

## 2016-09-25 DIAGNOSIS — Z91040 Latex allergy status: Secondary | ICD-10-CM | POA: Insufficient documentation

## 2016-09-25 DIAGNOSIS — J45909 Unspecified asthma, uncomplicated: Secondary | ICD-10-CM | POA: Insufficient documentation

## 2016-09-25 DIAGNOSIS — Y999 Unspecified external cause status: Secondary | ICD-10-CM | POA: Insufficient documentation

## 2016-09-25 DIAGNOSIS — Z79899 Other long term (current) drug therapy: Secondary | ICD-10-CM | POA: Insufficient documentation

## 2016-09-25 DIAGNOSIS — S62306A Unspecified fracture of fifth metacarpal bone, right hand, initial encounter for closed fracture: Secondary | ICD-10-CM | POA: Insufficient documentation

## 2016-09-25 DIAGNOSIS — S62336A Displaced fracture of neck of fifth metacarpal bone, right hand, initial encounter for closed fracture: Secondary | ICD-10-CM

## 2016-09-25 DIAGNOSIS — Y929 Unspecified place or not applicable: Secondary | ICD-10-CM | POA: Insufficient documentation

## 2016-09-25 DIAGNOSIS — W500XXA Accidental hit or strike by another person, initial encounter: Secondary | ICD-10-CM | POA: Insufficient documentation

## 2016-09-25 DIAGNOSIS — Y939 Activity, unspecified: Secondary | ICD-10-CM | POA: Insufficient documentation

## 2016-09-25 DIAGNOSIS — Z91010 Allergy to peanuts: Secondary | ICD-10-CM | POA: Insufficient documentation

## 2016-09-25 DIAGNOSIS — Z87891 Personal history of nicotine dependence: Secondary | ICD-10-CM | POA: Insufficient documentation

## 2016-09-25 MED ORDER — OXYCODONE-ACETAMINOPHEN 5-325 MG PO TABS
1.0000 | ORAL_TABLET | Freq: Once | ORAL | Status: AC
  Administered 2016-09-25: 1 via ORAL
  Filled 2016-09-25: qty 1

## 2016-09-25 MED ORDER — OXYCODONE-ACETAMINOPHEN 5-325 MG PO TABS: 2.0000 | ORAL_TABLET | ORAL | 0 refills | Status: DC | PRN
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
## 2016-09-25 NOTE — ED Notes (Signed)
Hand surgery at bedside.
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
## 2016-09-25 NOTE — ED Notes (Signed)
Ortho Paged to place Ulnar Gutter splint.

## 2016-09-25 NOTE — Progress Notes (Signed)
Orthopedic Tech Progress Note Patient Details:  Adrian [name] 20-Nov-1975 [id number]  Ortho Devices Type of Ortho Device: Ulna gutter splint Ortho Device/Splint Location: Applied Ulna Gutter Short arm splint to pt right arm/hand.  Pt tolerated fair.  Provided and applied Arm sling for support.  Right Hand/Arm Ortho Device/Splint Interventions: Application, Adjustment   Kristopher Oppenheim 09/25/2016, 12:44 PM

## 2016-09-25 NOTE — ED Notes (Signed)
Pt ambulated to room from waiting room, tolerated well. 
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
## 2016-09-25 NOTE — ED Notes (Signed)
ED Provider at bedside. 

## 2016-09-25 NOTE — ED Notes (Signed)
Updated and informed patient.  Will get ice for hand and right now does not want pain pill.  Will continue to monitor

## 2016-09-26 ENCOUNTER — Encounter (HOSPITAL_COMMUNITY): Payer: Self-pay | Admitting: Emergency Medicine

## 2016-09-26 ENCOUNTER — Emergency Department (HOSPITAL_COMMUNITY)
Admission: EM | Admit: 2016-09-26 | Discharge: 2016-09-26 | Disposition: A | Discharge status: Home / Self Care | Payer: Self-pay | Attending: Emergency Medicine | Admitting: Emergency Medicine

## 2016-09-26 ENCOUNTER — Emergency Department (HOSPITAL_COMMUNITY): Payer: Self-pay

## 2016-09-26 DIAGNOSIS — S46812A Strain of other muscles, fascia and tendons at shoulder and upper arm level, left arm, initial encounter: Secondary | ICD-10-CM | POA: Insufficient documentation

## 2016-09-26 DIAGNOSIS — Z79899 Other long term (current) drug therapy: Secondary | ICD-10-CM | POA: Insufficient documentation

## 2016-09-26 DIAGNOSIS — Y929 Unspecified place or not applicable: Secondary | ICD-10-CM | POA: Insufficient documentation

## 2016-09-26 DIAGNOSIS — M25512 Pain in left shoulder: Secondary | ICD-10-CM

## 2016-09-26 DIAGNOSIS — W01198A Fall on same level from slipping, tripping and stumbling with subsequent striking against other object, initial encounter: Secondary | ICD-10-CM | POA: Insufficient documentation

## 2016-09-26 DIAGNOSIS — J45909 Unspecified asthma, uncomplicated: Secondary | ICD-10-CM | POA: Insufficient documentation

## 2016-09-26 DIAGNOSIS — Z91040 Latex allergy status: Secondary | ICD-10-CM | POA: Insufficient documentation

## 2016-09-26 DIAGNOSIS — Y999 Unspecified external cause status: Secondary | ICD-10-CM | POA: Insufficient documentation

## 2016-09-26 DIAGNOSIS — Y939 Activity, unspecified: Secondary | ICD-10-CM | POA: Insufficient documentation

## 2016-09-26 DIAGNOSIS — Z87891 Personal history of nicotine dependence: Secondary | ICD-10-CM | POA: Insufficient documentation

## 2016-09-26 MED ORDER — METHOCARBAMOL 500 MG PO TABS
500.0000 mg | ORAL_TABLET | Freq: Once | ORAL | Status: AC
  Administered 2016-09-26: 500 mg via ORAL
  Filled 2016-09-26: qty 1

## 2016-09-26 MED ORDER — METHOCARBAMOL 500 MG PO TABS: 500.0000 mg | ORAL_TABLET | Freq: Two times a day (BID) | ORAL | 0 refills | Status: DC

## 2016-09-26 MED ORDER — IBUPROFEN 800 MG PO TABS
800.0000 mg | ORAL_TABLET | Freq: Once | ORAL | Status: AC
  Administered 2016-09-26: 800 mg via ORAL
  Filled 2016-09-26: qty 1

## 2016-09-26 MED ORDER — NAPROXEN 500 MG PO TABS: 500.0000 mg | ORAL_TABLET | Freq: Two times a day (BID) | ORAL | 0 refills | Status: DC

## 2016-09-26 NOTE — ED Provider Notes (Signed)
[facility] DEPT Provider Note   CSN: [id number] Arrival date & time: 09/26/16  0234     History   Chief Complaint Chief Complaint  Patient presents with  . Shoulder Pain    HPI Adrian [name] is a 41 y.o. male with a hx of Asthma, eczema, migraine headache presents to the Emergency Department complaining of acute, persistent left shoulder pain after fall several hours prior to arrival. Patient reports he was just seen for right arm fracture. He reports he was walking with his right arm in the sling as directed when he stumbled. He reports he fell striking his left shoulder on the ground. Did not hit his head or have a loss of consciousness. He denies neck or back pain. No numbness tingling or weakness in the left upper extremity. No treatments prior to arrival. Movement and palpation makes the symptoms worse. Nothing makes them better.  The history is provided by the patient and medical records. No language interpreter was used.    Past Medical History:  Diagnosis Date  . 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 Eczema   . Environmental allergies    grass  . Foot fracture, left   . Heart murmur   . Migraine   . Multiple food allergies    fish, tomatoes, peanuts, eggs, chicken and others    Patient Active Problem List   Diagnosis Date Noted  . Chronic migraine 09/13/2015  . Allergic rhinitis 04/09/2015  . Poor vision 04/09/2015  . Bilateral wrist pain 12/15/2014  . Pain, dental 05/19/2012  . Lipoma 01/25/2012  . Asthma in adult 01/13/2012  . Eczema, dyshidrotic 01/13/2012    Past Surgical History:  Procedure Laterality Date  . INGUINAL HERNIA REPAIR  1994       Home Medications    Prior to Admission medications   Medication Sig Start Date End Date Taking? Authorizing Provider  albuterol (PROVENTIL HFA;VENTOLIN HFA) 108 (90 Base) MCG/ACT inhaler Inhale 2 puffs into the lungs every 6 (six) hours as needed for wheezing or shortness of breath. 11/15/15   Josalyn Funches, MD    cetirizine (ZYRTEC) 10 MG tablet Take 1 tablet (10 mg total) by mouth daily. 09/13/15   Josalyn Funches, MD  fluticasone (FLONASE) 50 MCG/ACT nasal spray Place 2 sprays into both nostrils daily. 09/13/15   Josalyn Funches, MD  fluticasone (FLOVENT HFA) 44 MCG/ACT inhaler Inhale 2 puffs into the lungs 2 (two) times daily. 11/15/15   Josalyn Funches, MD  ibuprofen (ADVIL,MOTRIN) 200 MG tablet Take 400 mg by mouth every 6 (six) hours as needed for moderate pain.    Historical Provider, MD  methocarbamol (ROBAXIN) 500 MG tablet Take 1 tablet (500 mg total) by mouth 2 (two) times daily. 09/26/16   Mckaylie Vasey, PA-C  naproxen (NAPROSYN) 500 MG tablet Take 1 tablet (500 mg total) by mouth 2 (two) times daily with a meal. 09/26/16   Jonael Paradiso, PA-C  nortriptyline (PAMELOR) 25 MG capsule Take 1 capsule (25 mg total) by mouth at bedtime. Patient not taking: Reported on 08/15/2016 09/13/15   Boykin Nearing, MD  oxyCODONE-acetaminophen (PERCOCET) 5-325 MG tablet Take 2 tablets by mouth every 4 (four) hours as needed. 09/25/16   Merrily Pew, MD  predniSONE (DELTASONE) 10 MG tablet 6,5,4,3,2,1 08/15/16   Argentina Donovan, PA-C  ranitidine (ZANTAC) 300 MG tablet Take 1 tablet (300 mg total) by mouth at bedtime. 11/15/15   Josalyn Funches, MD  SUMAtriptan (IMITREX) 100 MG tablet Take 1 tablet (100 mg total)  by mouth every 2 (two) hours as needed for migraine. May repeat in 2 hours if headache persists or recurs. Patient not taking: Reported on 08/15/2016 09/13/15   Boykin Nearing, MD  triamcinolone ointment (KENALOG) 0.5 % Apply topically 2 (two) times daily. 08/15/16   Argentina Donovan, PA-C    Family History Family History  Problem Relation Age of Onset  . Asthma Father   . Diabetes Mother   . Hypertension Mother   . Clotting disorder      Aunt  . Breast cancer      Grandmother  . Prostate cancer      uncle  . Hyperlipidemia      grandparent  . Hypertension      grandparent/grandparent  .  Kidney disease      aunt/uncle/grandparent  . Stroke      grandparent    Social History Social History  Substance Use Topics  . Smoking status: Former Smoker    Types: Cigars    Quit date: 09/13/2012  . Smokeless tobacco: Never Used  . Alcohol use No     Allergies   Atrovent [ipratropium]; Peanut-containing drug products; Shellfish allergy; Tomato; and Latex   Review of Systems Review of Systems  Musculoskeletal: Positive for arthralgias and myalgias.  All other systems reviewed and are negative.    Physical Exam Updated Vital Signs BP 129/84 (BP Location: Right Arm)   Pulse 67   Temp 98.8 F (37.1 C) (Oral)   Resp 18   Ht 5\' 11"  (1.803 m)   Wt 99.8 kg   SpO2 98%   BMI 30.68 kg/m   Physical Exam  Constitutional: He appears well-developed and well-nourished. No distress.  HENT:  Head: Normocephalic and atraumatic.  Eyes: Conjunctivae are normal.  Neck: Normal range of motion.  Cardiovascular: Normal rate, regular rhythm and intact distal pulses.   Capillary refill < 3 sec  Pulmonary/Chest: Effort normal and breath sounds normal.  Musculoskeletal: He exhibits tenderness. He exhibits no edema.       Left shoulder: He exhibits decreased range of motion ( Slightly), tenderness and pain. He exhibits no swelling, no effusion, no crepitus, normal pulse and normal strength.  Left upper extremity with intact sensation and strength 5/5 with tenderness to palpation along the left trapezius and left shoulder. No deformity. Patient with almost full range of motion while distracted.    Neurological: He is alert. Coordination normal.  Skin: Skin is warm and dry. He is not diaphoretic.  No tenting of the skin  Psychiatric: He has a normal mood and affect.  Nursing note and vitals reviewed.    ED Treatments / Results  Labs (all labs ordered are listed, but only abnormal results are displayed) Labs Reviewed - No data to display  Radiology Dg Shoulder Left  Result  Date: 09/26/2016 CLINICAL DATA:  Status post fall, with left shoulder pain. Initial encounter. EXAM: LEFT SHOULDER - 2+ VIEW COMPARISON:  None. FINDINGS: There is no evidence of fracture or dislocation. The left humeral head is seated within the glenoid fossa. Mild degenerative change is noted at the left acromioclavicular joint. No significant soft tissue abnormalities are seen. The visualized portions of the left lung are clear. IMPRESSION: No evidence of fracture or dislocation. Electronically Signed   By: Garald Balding M.D.   On: 09/26/2016 03:22    Procedures Procedures (including critical care time)  Medications Ordered in ED Medications  methocarbamol (ROBAXIN) tablet 500 mg (not administered)  ibuprofen (ADVIL,MOTRIN) tablet 800 mg (  not administered)     Initial Impression / Assessment and Plan / ED Course  I have reviewed the triage vital signs and the nursing notes.  Pertinent labs & imaging results that were available during my care of the patient were reviewed by me and considered in my medical decision making (see chart for details).     Patient X-Ray negative for obvious fracture or dislocation. Pain managed in ED. Pt advised to follow up with orthopedics if symptoms persist. Conservative therapy recommended and discussed. Patient will be dc home & is agreeable with above plan.   Final Clinical Impressions(s) / ED Diagnoses   Final diagnoses:  Acute pain of left shoulder  Strain of left trapezius muscle, initial encounter    New Prescriptions New Prescriptions   METHOCARBAMOL (ROBAXIN) 500 MG TABLET    Take 1 tablet (500 mg total) by mouth 2 (two) times daily.   NAPROXEN (NAPROSYN) 500 MG TABLET    Take 1 tablet (500 mg total) by mouth 2 (two) times daily with a meal.     Abigail Butts, PA-C 09/26/16 [place], MD 09/26/16 [phone]

## 2016-09-26 NOTE — Discharge Instructions (Signed)
1. Medications: alternate naprosyn and tylenol for pain control, Use robaxin for muscle spasm; usual home medications 2. Treatment: rest, ice, elevate and use brace, drink plenty of fluids, gentle stretching 3. Follow Up: Please followup with orthopedics as directed or your PCP in 1 week if no improvement for discussion of your diagnoses and further evaluation after today's visit; if you do not have a primary care doctor use the resource guide provided to find one; Please return to the ER for worsening symptoms or other concerns

## 2016-09-26 NOTE — ED Triage Notes (Signed)
Pt reports fall, seen earlier today for broken hand. States he tripped and was unable to catch self d/t sling (which he is not wearing at this time).

## 2016-10-02 ENCOUNTER — Encounter: Payer: Self-pay | Admitting: Physician Assistant

## 2016-10-02 ENCOUNTER — Ambulatory Visit: Payer: Self-pay | Attending: Family Medicine | Admitting: Physician Assistant

## 2016-10-02 VITALS — BP 110/63 | HR 59 | Temp 98.1°F | Resp 16 | Wt 223.2 lb

## 2016-10-02 DIAGNOSIS — Z91040 Latex allergy status: Secondary | ICD-10-CM | POA: Insufficient documentation

## 2016-10-02 DIAGNOSIS — Z91010 Allergy to peanuts: Secondary | ICD-10-CM | POA: Insufficient documentation

## 2016-10-02 DIAGNOSIS — Z91012 Allergy to eggs: Secondary | ICD-10-CM | POA: Insufficient documentation

## 2016-10-02 DIAGNOSIS — S62604A Fracture of unspecified phalanx of right ring finger, initial encounter for closed fracture: Secondary | ICD-10-CM | POA: Insufficient documentation

## 2016-10-02 DIAGNOSIS — S62606A Fracture of unspecified phalanx of right little finger, initial encounter for closed fracture: Secondary | ICD-10-CM | POA: Insufficient documentation

## 2016-10-02 DIAGNOSIS — S62339A Displaced fracture of neck of unspecified metacarpal bone, initial encounter for closed fracture: Secondary | ICD-10-CM

## 2016-10-02 DIAGNOSIS — Z91013 Allergy to seafood: Secondary | ICD-10-CM | POA: Insufficient documentation

## 2016-10-02 MED FILL — TRIAMCINOLONE 0.5% OINTMENT: 0.5 | 30 days supply | Qty: 75 | Fill #1

## 2016-10-02 NOTE — Patient Instructions (Signed)
THOMPSON, DAVID A., MD.   Specialty: Orthopedic Surgery  Why: They will call you with an appointment  Contact information:  [place] [address]  [phone]
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
## 2016-11-12 ENCOUNTER — Encounter: Payer: Self-pay | Admitting: Family Medicine

## 2017-02-03 ENCOUNTER — Other Ambulatory Visit: Payer: Self-pay | Admitting: Family Medicine

## 2017-02-03 DIAGNOSIS — J4520 Mild intermittent asthma, uncomplicated: Secondary | ICD-10-CM

## 2017-02-03 MED FILL — VENTOLIN HFA 90 MCG INHALER: 108 (90 BAS | 25 days supply | Qty: 18 | Fill #0

## 2017-02-03 MED FILL — TRIAMCINOLONE 0.5% OINTMENT: 0.5 | 30 days supply | Qty: 75 | Fill #2

## 2017-05-13 MED FILL — !VENTOLIN HFA INHALER: 108 (90 BAS | 25 days supply | Qty: 18 | Fill #1

## 2017-05-13 MED FILL — TRIAMCINOLONE 0.5% OINTMENT: 0.5 | 30 days supply | Qty: 75 | Fill #3

## 2017-07-26 ENCOUNTER — Encounter (HOSPITAL_COMMUNITY): Payer: Self-pay | Admitting: *Deleted

## 2017-07-26 ENCOUNTER — Other Ambulatory Visit: Payer: Self-pay

## 2017-07-26 ENCOUNTER — Emergency Department (HOSPITAL_COMMUNITY)
Admission: EM | Admit: 2017-07-26 | Discharge: 2017-07-27 | Disposition: A | Discharge status: Home / Self Care | Payer: Self-pay | Attending: Physician Assistant | Admitting: Physician Assistant

## 2017-07-26 DIAGNOSIS — J45909 Unspecified asthma, uncomplicated: Secondary | ICD-10-CM | POA: Insufficient documentation

## 2017-07-26 DIAGNOSIS — Z87891 Personal history of nicotine dependence: Secondary | ICD-10-CM | POA: Insufficient documentation

## 2017-07-26 DIAGNOSIS — L0291 Cutaneous abscess, unspecified: Secondary | ICD-10-CM

## 2017-07-26 DIAGNOSIS — L02511 Cutaneous abscess of right hand: Secondary | ICD-10-CM | POA: Insufficient documentation

## 2017-07-26 DIAGNOSIS — Z79899 Other long term (current) drug therapy: Secondary | ICD-10-CM | POA: Insufficient documentation

## 2017-07-26 DIAGNOSIS — Z91010 Allergy to peanuts: Secondary | ICD-10-CM | POA: Insufficient documentation

## 2017-07-26 MED ORDER — SULFAMETHOXAZOLE-TRIMETHOPRIM 800-160 MG PO TABS: 1.0000 | ORAL_TABLET | Freq: Two times a day (BID) | ORAL | 0 refills | Status: AC

## 2017-07-26 MED ORDER — IBUPROFEN 800 MG PO TABS
800.0000 mg | ORAL_TABLET | Freq: Once | ORAL | Status: AC
  Administered 2017-07-26: 800 mg via ORAL
  Filled 2017-07-26: qty 1

## 2017-07-26 MED ORDER — SULFAMETHOXAZOLE-TRIMETHOPRIM 800-160 MG PO TABS
1.0000 | ORAL_TABLET | Freq: Once | ORAL | Status: AC
  Administered 2017-07-26: 1 via ORAL
  Filled 2017-07-26: qty 1

## 2017-07-26 MED ORDER — LIDOCAINE HCL (PF) 1 % IJ SOLN
5.0000 mL | Freq: Once | INTRAMUSCULAR | Status: AC
  Administered 2017-07-26: 5 mL via INTRADERMAL
  Filled 2017-07-26: qty 5

## 2017-07-26 NOTE — ED Provider Notes (Signed)
[facility] EMERGENCY DEPARTMENT Provider Note   CSN: [id number] Arrival date & time: 07/26/17  1441     History   Chief Complaint Chief Complaint  Patient presents with  . Abscess    HPI Lenin Kuhnle is a 42 y.o. male.  HPI   Patient is a 42 year old male presenting with abscess to the right hand.  Patient noticed it increasing in size over the last several days.  He said he "tried to take a knife to it".  But did not have much like.  Patient also has an additional abscess that is already draining in his left groin.  Past Medical History:  Diagnosis Date  . 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 Eczema   . Environmental allergies    grass  . Foot fracture, left   . Heart murmur   . Migraine   . Multiple food allergies    fish, tomatoes, peanuts, eggs, chicken and others    Patient Active Problem List   Diagnosis Date Noted  . Chronic migraine 09/13/2015  . Allergic rhinitis 04/09/2015  . Poor vision 04/09/2015  . Bilateral wrist pain 12/15/2014  . Pain, dental 05/19/2012  . Lipoma 01/25/2012  . Asthma in adult 01/13/2012  . Eczema, dyshidrotic 01/13/2012    Past Surgical History:  Procedure Laterality Date  . INGUINAL HERNIA REPAIR  1994       Home Medications    Prior to Admission medications   Medication Sig Start Date End Date Taking? Authorizing Provider  cetirizine (ZYRTEC) 10 MG tablet Take 1 tablet (10 mg total) by mouth daily. Patient not taking: Reported on 10/02/2016 09/13/15   Boykin Nearing, MD  fluticasone (FLONASE) 50 MCG/ACT nasal spray Place 2 sprays into both nostrils daily. Patient not taking: Reported on 10/02/2016 09/13/15   Boykin Nearing, MD  fluticasone (FLOVENT HFA) 44 MCG/ACT inhaler Inhale 2 puffs into the lungs 2 (two) times daily. 11/15/15   Funches, Adriana Mccallum, MD  ibuprofen (ADVIL,MOTRIN) 200 MG tablet Take 400 mg by mouth every 6 (six) hours as needed for moderate pain.    [provider]  methocarbamol (ROBAXIN)  500 MG tablet Take 1 tablet (500 mg total) by mouth 2 (two) times daily. 09/26/16   Muthersbaugh, Jarrett Soho, PA-C  naproxen (NAPROSYN) 500 MG tablet Take 1 tablet (500 mg total) by mouth 2 (two) times daily with a meal. 09/26/16   Muthersbaugh, Jarrett Soho, PA-C  nortriptyline (PAMELOR) 25 MG capsule Take 1 capsule (25 mg total) by mouth at bedtime. Patient not taking: Reported on 08/15/2016 09/13/15   Boykin Nearing, MD  oxyCODONE-acetaminophen (PERCOCET) 5-325 MG tablet Take 2 tablets by mouth every 4 (four) hours as needed. 09/25/16   Mesner, Corene Cornea, MD  predniSONE (DELTASONE) 10 MG tablet 6,5,4,3,2,1 08/15/16   Argentina Donovan, PA-C  ranitidine (ZANTAC) 300 MG tablet Take 1 tablet (300 mg total) by mouth at bedtime. 11/15/15   Funches, Adriana Mccallum, MD  SUMAtriptan (IMITREX) 100 MG tablet Take 1 tablet (100 mg total) by mouth every 2 (two) hours as needed for migraine. May repeat in 2 hours if headache persists or recurs. Patient not taking: Reported on 08/15/2016 09/13/15   Boykin Nearing, MD  triamcinolone ointment (KENALOG) 0.5 % Apply topically 2 (two) times daily. 08/15/16   Argentina Donovan, PA-C  VENTOLIN HFA 108 (90 Base) MCG/ACT inhaler INHALE 2 PUFFS INTO THE LUNGS EVERY 6 HOURS AS NEEDED FOR WHEEZING OR SHORTNESS OF BREATH. 02/03/17   Boykin Nearing, MD  Family History Family History  Problem Relation Age of Onset  . Asthma Father   . Diabetes Mother   . Hypertension Mother   . Clotting disorder Unknown        Aunt  . Breast cancer Unknown        Grandmother  . Prostate cancer Unknown        uncle  . Hyperlipidemia Unknown        grandparent  . Hypertension Unknown        grandparent/grandparent  . Kidney disease Unknown        aunt/uncle/grandparent  . Stroke Unknown        grandparent    Social History Social History   Tobacco Use  . Smoking status: Former Smoker    Types: Cigars    Last attempt to quit: 09/13/2012    Years since quitting: 4.8  . Smokeless tobacco: Never  Used  Substance Use Topics  . Alcohol use: No  . Drug use: Yes    Types: Marijuana     Allergies   Atrovent [ipratropium]; Peanut-containing drug products; Shellfish allergy; Tomato; and Latex   Review of Systems Review of Systems  Constitutional: Negative for activity change.  Respiratory: Negative for shortness of breath.   Cardiovascular: Negative for chest pain.  Gastrointestinal: Negative for abdominal pain.  Skin: Positive for color change.  All other systems reviewed and are negative.    Physical Exam Updated Vital Signs BP (!) 147/99 (BP Location: Right Arm)   Pulse (!) 49   Temp 98.7 F (37.1 C) (Oral)   Resp 18   SpO2 97%   Physical Exam  Constitutional: He is oriented to person, place, and time. He appears well-nourished.  HENT:  Head: Normocephalic.  Eyes: Conjunctivae are normal.  Cardiovascular: Normal rate.  Pulmonary/Chest: Effort normal.  Abdominal:  Left groin draining abscess no surrounding cellulitis  Musculoskeletal:  Small abscess to the right pinky on the dorsum of the hand.  No real surrounding cellulitis. Pic below  Neurological: He is oriented to person, place, and time.  Skin: Skin is warm and dry. He is not diaphoretic.  Psychiatric: He has a normal mood and affect. His behavior is normal.     ED Treatments / Results  Labs (all labs ordered are listed, but only abnormal results are displayed) Labs Reviewed - No data to display  EKG  EKG Interpretation None       Radiology No results found.  Procedures .Marland KitchenIncision and Drainage Date/Time: 07/26/2017 6:49 PM Performed by: Macarthur Critchley, MD Authorized by: Macarthur Critchley, MD   Consent:    Consent obtained:  Verbal Location:    Type:  Abscess   Size:  R pinky Pre-procedure details:    Skin preparation:  Betadine Anesthesia (see MAR for exact dosages):    Anesthesia method:  Local infiltration   Local anesthetic:  Lidocaine 1% WITH epi Procedure type:      Complexity:  Complex Procedure details:    Needle aspiration: no     Incision types:  Stab incision and single straight   Scalpel blade:  10   Wound management:  Probed and deloculated   Drainage:  Purulent   Drainage amount:  Moderate   Wound treatment:  Wound left open   Packing materials:  None Post-procedure details:    Patient tolerance of procedure:  Tolerated well, no immediate complications Comments:     Patient had much relief as purulence drained.   (including critical care time)  Medications Ordered in ED Medications  ibuprofen (ADVIL,MOTRIN) tablet 800 mg (not administered)  lidocaine (PF) (XYLOCAINE) 1 % injection 5 mL (not administered)  sulfamethoxazole-trimethoprim (BACTRIM DS,SEPTRA DS) 800-160 MG per tablet 1 tablet (not administered)     Initial Impression / Assessment and Plan / ED Course  I have reviewed the triage vital signs and the nursing notes.  Pertinent labs & imaging results that were available during my care of the patient were reviewed by me and considered in my medical decision making (see chart for details).     Patient is a 42 year old male presenting with abscess to the right hand.  Patient noticed it increasing in size over the last several days.  He said he "tried to take a knife to it".  But did not have much like.  Patient also has an additional abscess that is already draining in his left groin.  5:54 PM Will do small I&D to right pinky finger.  Patient had swelling in the area.  But does not appear to be a deep infection.  Patient had no surrounding redness.  He had no tenderness along his tendon.  No difficulty with mobility.  No difference in sensation.  Will get start oral antibiotics and return precautions were expressed.  I&D was successful.  Hand is a high risk area but it was such a focal small area of abscess with no surrounding cellulitis.  Will give patient oral antibiotics and have patient follow-up.  Patient was able to  clearly express return precautions.      Final Clinical Impressions(s) / ED Diagnoses   Final diagnoses:  None    ED Discharge Orders    None       Macarthur Critchley, MD 07/26/17 1900

## 2017-07-26 NOTE — ED Triage Notes (Signed)
Pt reports possible bug bite to right hand, has swelling and redness to his hand and now unable to bend some of his fingers. Denies fever.

## 2017-07-26 NOTE — ED Notes (Signed)
Rt little finger swollen and painful  Not fever

## 2017-07-26 NOTE — Discharge Instructions (Signed)
Please return if increasing redness or increasing pain in your hand.  As we discussed please use warm compresses to help express the pus.

## 2017-08-17 ENCOUNTER — Ambulatory Visit: Payer: Self-pay | Admitting: Nurse Practitioner

## 2017-09-25 ENCOUNTER — Ambulatory Visit: Payer: Self-pay | Attending: Nurse Practitioner | Admitting: Nurse Practitioner

## 2017-09-25 ENCOUNTER — Encounter: Payer: Self-pay | Admitting: Nurse Practitioner

## 2017-09-25 VITALS — BP 114/71 | HR 55 | Temp 98.2°F | Resp 16 | Ht 71.0 in | Wt 215.8 lb

## 2017-09-25 DIAGNOSIS — Z91040 Latex allergy status: Secondary | ICD-10-CM | POA: Insufficient documentation

## 2017-09-25 DIAGNOSIS — Z91013 Allergy to seafood: Secondary | ICD-10-CM | POA: Insufficient documentation

## 2017-09-25 DIAGNOSIS — Z91010 Allergy to peanuts: Secondary | ICD-10-CM | POA: Insufficient documentation

## 2017-09-25 DIAGNOSIS — Z7251 High risk heterosexual behavior: Secondary | ICD-10-CM | POA: Insufficient documentation

## 2017-09-25 DIAGNOSIS — Z8249 Family history of ischemic heart disease and other diseases of the circulatory system: Secondary | ICD-10-CM | POA: Insufficient documentation

## 2017-09-25 DIAGNOSIS — Z833 Family history of diabetes mellitus: Secondary | ICD-10-CM | POA: Insufficient documentation

## 2017-09-25 DIAGNOSIS — J4520 Mild intermittent asthma, uncomplicated: Secondary | ICD-10-CM | POA: Insufficient documentation

## 2017-09-25 DIAGNOSIS — L309 Dermatitis, unspecified: Secondary | ICD-10-CM | POA: Insufficient documentation

## 2017-09-25 DIAGNOSIS — Z91012 Allergy to eggs: Secondary | ICD-10-CM | POA: Insufficient documentation

## 2017-09-25 DIAGNOSIS — J3089 Other allergic rhinitis: Secondary | ICD-10-CM | POA: Insufficient documentation

## 2017-09-25 DIAGNOSIS — Z888 Allergy status to other drugs, medicaments and biological substances status: Secondary | ICD-10-CM | POA: Insufficient documentation

## 2017-09-25 DIAGNOSIS — Z91018 Allergy to other foods: Secondary | ICD-10-CM | POA: Insufficient documentation

## 2017-09-25 DIAGNOSIS — Z825 Family history of asthma and other chronic lower respiratory diseases: Secondary | ICD-10-CM | POA: Insufficient documentation

## 2017-09-25 MED ORDER — FLUTICASONE PROPIONATE 50 MCG/ACT NA SUSP: 2.0000 | Freq: Every day | NASAL | 6 refills | Status: DC

## 2017-09-25 MED ORDER — FLUTICASONE PROPIONATE HFA 44 MCG/ACT IN AERO: 2.0000 | INHALATION_SPRAY | Freq: Two times a day (BID) | RESPIRATORY_TRACT | 12 refills | Status: DC

## 2017-09-25 MED ORDER — ALBUTEROL SULFATE HFA 108 (90 BASE) MCG/ACT IN AERS: 2.0000 | INHALATION_SPRAY | Freq: Four times a day (QID) | RESPIRATORY_TRACT | 1 refills | Status: DC | PRN

## 2017-09-25 MED ORDER — TRIAMCINOLONE ACETONIDE 0.5 % EX OINT: TOPICAL_OINTMENT | Freq: Two times a day (BID) | CUTANEOUS | 3 refills | Status: DC

## 2017-09-25 MED ORDER — CETIRIZINE HCL 10 MG PO TABS: 10.0000 mg | ORAL_TABLET | Freq: Every day | ORAL | 11 refills | Status: DC

## 2017-09-25 MED FILL — ALBUTEROL SULFATE HFA 108 (: 108 (90 BAS | 25 days supply | Qty: 18 | Fill #0

## 2017-09-25 MED FILL — TRIAMCINOLONE 0.5% OINTMENT: 0.5 | 10 days supply | Qty: 30 | Fill #0

## 2017-09-25 MED FILL — !FLOVENT HFA 44 MCG INHALER: 44 MCG | 30 days supply | Qty: 1 | Fill #0

## 2017-09-25 NOTE — Patient Instructions (Signed)
Eczema Eczema is a broad term for a group of skin conditions that cause skin to become rough and inflamed. Each type of eczema has different triggers, symptoms, and treatments. Eczema of any type is usually itchy and symptoms range from mild to severe. Eczema and its symptoms are not spread from person to person (are not contagious). It can appear on different parts of the body at different times. Your eczema may not look the same as someone else's eczema. What are the types of eczema? Atopic dermatitis This is a long-term (chronic) skin disease that keeps coming back (recurring). Usual symptoms are dry skin and small, solid pimples that may swell and leak fluid (weep). Contact dermatitis This happens when something irritates the skin and causes a rash. The irritation can come from substances that you are allergic to (allergens), such as poison ivy, chemicals, or medicines that were applied to your skin. Dyshidrotic eczema This is a form of eczema on the hands and feet. It shows up as very itchy, fluid-filled blisters. It can affect people of any age, but is more common before age 40. Hand eczema This causes very itchy areas of skin on the palms and sides of the hands and fingers. This type of eczema is common in industrial jobs where you may be exposed to many different types of irritants. Lichen simplex chronicus This type of eczema occurs when a person constantly scratches one area of the body. Repeated scratching of the area leads to thickened skin (lichenification). Lichen simplex chronicus can occur along with other types of eczema. It is more common in adults, but may be seen in children as well. Nummular eczema This is a common type of eczema. It has no known cause. It typically causes a red, circular, crusty lesion (plaque) that may be itchy. Scratching may become a habit and can cause bleeding. Nummular eczema occurs most often in people of middle-age or older. It most often affects the  hands. Seborrheic dermatitis This is a common skin disease that mainly affects the scalp. It may also affect any oily areas of the body, such as the face, sides of nose, eyebrows, ears, eyelids, and chest. It is marked by small scaling and redness of the skin (erythema). This can affect people of all ages. In infants, this condition is known as "cradle cap." Stasis dermatitis This is a common skin disease that usually appears on the legs and feet. It most often occurs in people who have a condition that prevents blood from being pumped through the veins in the legs (chronic venous insufficiency). Stasis dermatitis is a chronic condition that needs long-term management. How is eczema diagnosed? Your health care provider will examine your skin and review your medical history. He or she may also give you skin patch tests. These tests involve taking patches that contain possible allergens and placing them on your back. He or she will then check in a few days to see if an allergic reaction occurred. What are the common treatments? Treatment for eczema is based on the type of eczema you have. Hydrocortisone steroid medicine can relieve itching quickly and help reduce inflammation. This medicine may be prescribed or obtained over-the-counter, depending on the strength of the medicine that is needed. Follow these instructions at home:  Take over-the-counter and prescription medicines only as told by your health care provider.  Use creams or ointments to moisturize your skin. Do not use lotions.  Learn what triggers or irritates your symptoms. Avoid these things.  Treat   symptom flare-ups quickly.  Do not itch your skin. This can make your rash worse.  Keep all follow-up visits as told by your health care provider. This is important. Where to find more information:  The American Academy of Dermatology: http://jones-macias.info/  The National Eczema Association: www.nationaleczema.org Contact a health care  provider if:  You have serious itching, even with treatment.  You regularly scratch your skin until it bleeds.  Your rash looks different than usual.  Your skin is painful, swollen, or more red than usual.  You have a fever. Summary  There are eight general types of eczema. Each type has different triggers.  Eczema of any type causes itching that may range from mild to severe.  Treatment varies based on the type of eczema you have. Hydrocortisone steroid medicine can help with itching and inflammation.  Protecting your skin is the best way to prevent eczema. Use moisturizers and lotions. Avoid triggers and irritants, and treat flare-ups quickly. This information is not intended to replace advice given to you by your health care provider. Make sure you discuss any questions you have with your health care provider. Document Released: 10/30/2016 Document Revised: 10/30/2016 Document Reviewed: 10/30/2016 Elsevier Interactive Patient Education  2018 [employer].  Atopic Dermatitis Atopic dermatitis is a skin disorder that causes inflammation of the skin. This is the most common type of eczema. Eczema is a group of skin conditions that cause the skin to be itchy, red, and swollen. This condition is generally worse during the cooler winter months and often improves during the warm summer months. Symptoms can vary from person to person. Atopic dermatitis usually starts showing signs in infancy and can last through adulthood. This condition cannot be passed from one person to another (non-contagious), but it is more common in families. Atopic dermatitis may not always be present. When it is present, it is called a flare-up. What are the causes? The exact cause of this condition is not known. Flare-ups of the condition may be triggered by:  Contact with something that you are sensitive or allergic to.  Stress.  Certain foods.  Extremely hot or cold weather.  Harsh chemicals and  soaps.  Dry air.  Chlorine.  What increases the risk? This condition is more likely to develop in people who have a personal history or family history of eczema, allergies, asthma, or hay fever. What are the signs or symptoms? Symptoms of this condition include:  Dry, scaly skin.  Red, itchy rash.  Itchiness, which can be severe. This may occur before the skin rash. This can make sleeping difficult.  Skin thickening and cracking that can occur over time.  How is this diagnosed? This condition is diagnosed based on your symptoms, a medical history, and a physical exam. How is this treated? There is no cure for this condition, but symptoms can usually be controlled. Treatment focuses on:  Controlling the itchiness and scratching. You may be given medicines, such as antihistamines or steroid creams.  Limiting exposure to things that you are sensitive or allergic to (allergens).  Recognizing situations that cause stress and developing a plan to manage stress.  If your atopic dermatitis does not get better with medicines, or if it is all over your body (widespread), a treatment using a specific type of light (phototherapy) may be used. Follow these instructions at home: Skin care  Keep your skin well-moisturized. Doing this seals in moisture and helps to prevent dryness. ? Use unscented lotions that have petroleum in them. ?  Avoid lotions that contain alcohol or water. They can dry the skin.  Keep baths or showers short (less than 5 minutes) in warm water. Do not use hot water. ? Use mild, unscented cleansers for bathing. Avoid soap and bubble bath. ? Apply a moisturizer to your skin right after a bath or shower.  Do not apply anything to your skin without checking with your health care provider. General instructions  Dress in clothes made of cotton or cotton blends. Dress lightly because heat increases itchiness.  When washing your clothes, rinse your clothes twice so all  of the soap is removed.  Avoid any triggers that can cause a flare-up.  Try to manage your stress.  Keep your fingernails cut short.  Avoid scratching. Scratching makes the rash and itchiness worse. It may also result in a skin infection (impetigo) due to a break in the skin caused by scratching.  Take or apply over-the-counter and prescription medicines only as told by your health care provider.  Keep all follow-up visits as told by your health care provider. This is important.  Do not be around people who have cold sores or fever blisters. If you get the infection, it may cause your atopic dermatitis to worsen. Contact a health care provider if:  Your itchiness interferes with sleep.  Your rash gets worse or it is not better within one week of starting treatment.  You have a fever.  You have a rash flare-up after having contact with someone who has cold sores or fever blisters. Get help right away if:  You develop pus or soft yellow scabs in the rash area. Summary  This condition causes a red rash and itchy, dry, scaly skin.  Treatment focuses on controlling the itchiness and scratching, limiting exposure to things that you are sensitive or allergic to (allergens), recognizing situations that cause stress, and developing a plan to manage stress.  Keep your skin well-moisturized.  Keep baths or showers shorter than 5 minutes and use warm water. Do not use hot water. This information is not intended to replace advice given to you by your health care provider. Make sure you discuss any questions you have with your health care provider. Document Released: 06/13/2000 Document Revised: 07/18/2016 Document Reviewed: 07/18/2016 Elsevier Interactive Patient Education  [employer].

## 2017-09-25 NOTE — Progress Notes (Signed)
Assessment & Plan:  Adrian [name] was seen today for re-establish, eczema and std testing.  Diagnoses and all orders for this visit:  Asthma in adult, mild intermittent, uncomplicated -     cetirizine (ZYRTEC) 10 MG tablet; Take 1 tablet (10 mg total) by mouth daily. -     fluticasone (FLOVENT HFA) 44 MCG/ACT inhaler; Inhale 2 puffs into the lungs 2 (two) times daily. -     albuterol (VENTOLIN HFA) 108 (90 Base) MCG/ACT inhaler; Inhale 2 puffs into the lungs every 6 (six) hours as needed for wheezing or shortness of breath (cough). INSTRUCTIONS: ASTHMA RED FLAGS  High risk heterosexual behavior -     HIV antibody (with reflex) -     Urine cytology ancillary only  Allergic rhinitis due to other allergic trigger, unspecified seasonality -     fluticasone (FLONASE) 50 MCG/ACT nasal spray; Place 2 sprays into both nostrils daily.  Eczema of both hands -     triamcinolone ointment (KENALOG) 0.5 %; Apply topically 2 (two) times daily.    Patient has been counseled on age-appropriate routine health concerns for screening and prevention. These are reviewed and up-to-date. Referrals have been placed accordingly. Immunizations are up-to-date or declined.    Subjective:   Chief Complaint  Patient presents with  . re-establish  . Eczema  . STD testing   HPI Adrian [name] 42 y.o. male presents to office today to establish care.   Eczema He has severe atopic dermatitis which has been treated in the past with triamcinolone ointment. He does have significant scarring to both hands likely from chronic or overuse of steroids.  Symptoms have been gradually worsening since running out of triamcinolone.  Risk factors include allergic rhinitis/seasonal allergies/asthma. Treatment modalities that have been used in the past include: triamcinolone ointment.    Sexually Transmitted Disease Check Patient presents for sexually transmitted disease check. Sexual history reviewed with the patient. STD  exposure: sexual contact with individual with uncertain background.  Previous history of STD:  none. Current symptoms include none.  Contraception: none.  Asthma Chronic since childhood. Stable. Symptoms well controlled on flovent. He uses his ventolin inhaler sparingly. Denies cough, wheezing or shortness of breath.   Review of Systems  Constitutional: Negative for fever, malaise/fatigue and weight loss.  HENT: Negative.  Negative for nosebleeds.   Eyes: Negative.  Negative for blurred vision, double vision and photophobia.  Respiratory: Negative.  Negative for cough, shortness of breath and wheezing.   Cardiovascular: Negative.  Negative for chest pain, palpitations and leg swelling.  Gastrointestinal: Negative.  Negative for heartburn, nausea and vomiting.  Musculoskeletal: Negative.  Negative for myalgias.  Skin: Positive for itching and rash.  Neurological: Negative.  Negative for dizziness, focal weakness, seizures and headaches.  Endo/Heme/Allergies: Positive for environmental allergies.  Psychiatric/Behavioral: Negative.  Negative for suicidal ideas.    Past Medical History:  Diagnosis Date  . 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 Eczema   . Environmental allergies    grass  . Foot fracture, left   . Heart murmur   . Migraine   . Multiple food allergies    fish, tomatoes, peanuts, eggs, chicken and others    Past Surgical History:  Procedure Laterality Date  . INGUINAL HERNIA REPAIR  1994    Family History  Problem Relation Age of Onset  . Asthma Father   . Diabetes Mother   . Hypertension Mother   . Clotting disorder Unknown  Aunt  . Breast cancer Unknown        Grandmother  . Prostate cancer Unknown        uncle  . Hyperlipidemia Unknown        grandparent  . Hypertension Unknown        grandparent/grandparent  . Kidney disease Unknown        aunt/uncle/grandparent  . Stroke Unknown        grandparent    Social History Reviewed with no changes to be made  today.   Outpatient Medications Prior to Visit  Medication Sig Dispense Refill  . acetaminophen (TYLENOL) 500 MG tablet Take 500-1,000 mg by mouth every 6 (six) hours as needed (for pain).    . calcium carbonate (TUMS - DOSED IN MG ELEMENTAL CALCIUM) 500 MG chewable tablet Chew 1-2 tablets by mouth as needed for indigestion or heartburn.    . cetirizine (ZYRTEC) 10 MG tablet Take 1 tablet (10 mg total) by mouth daily. (Patient not taking: Reported on 07/26/2017) 30 tablet 11  . fluticasone (FLONASE) 50 MCG/ACT nasal spray Place 2 sprays into both nostrils daily. (Patient not taking: Reported on 07/26/2017) 16 g 6  . fluticasone (FLOVENT HFA) 44 MCG/ACT inhaler Inhale 2 puffs into the lungs 2 (two) times daily. (Patient not taking: Reported on 07/26/2017) 1 Inhaler 12  . methocarbamol (ROBAXIN) 500 MG tablet Take 1 tablet (500 mg total) by mouth 2 (two) times daily. (Patient not taking: Reported on 07/26/2017) 20 tablet 0  . naproxen (NAPROSYN) 500 MG tablet Take 1 tablet (500 mg total) by mouth 2 (two) times daily with a meal. (Patient not taking: Reported on 07/26/2017) 30 tablet 0  . nortriptyline (PAMELOR) 25 MG capsule Take 1 capsule (25 mg total) by mouth at bedtime. (Patient not taking: Reported on 07/26/2017) 30 capsule 1  . oxyCODONE-acetaminophen (PERCOCET) 5-325 MG tablet Take 2 tablets by mouth every 4 (four) hours as needed. (Patient not taking: Reported on 07/26/2017) 30 tablet 0  . predniSONE (DELTASONE) 10 MG tablet 6,5,4,3,2,1 (Patient not taking: Reported on 07/26/2017) 21 tablet 0  . ranitidine (ZANTAC) 300 MG tablet Take 1 tablet (300 mg total) by mouth at bedtime. (Patient not taking: Reported on 07/26/2017) 30 tablet 6  . SUMAtriptan (IMITREX) 100 MG tablet Take 1 tablet (100 mg total) by mouth every 2 (two) hours as needed for migraine. May repeat in 2 hours if headache persists or recurs. (Patient not taking: Reported on 07/26/2017) 10 tablet 0  . triamcinolone ointment (KENALOG) 0.5 %  Apply topically 2 (two) times daily. 80 g 3  . VENTOLIN HFA 108 (90 Base) MCG/ACT inhaler INHALE 2 PUFFS INTO THE LUNGS EVERY 6 HOURS AS NEEDED FOR WHEEZING OR SHORTNESS OF BREATH. 18 g 1   No facility-administered medications prior to visit.     Allergies  Allergen Reactions  . Apple Shortness Of Breath, Itching, Nausea Only and Swelling    Tongue itches also  . Atrovent [Ipratropium] Anaphylaxis    Atrovent contains peanut oil and PT has a HX of anaphlaxis to peanuts  . Banana Shortness Of Breath and Itching    Makes tongue itch  . Carrot Oil Shortness Of Breath and Itching  . Fish Allergy Anaphylaxis, Hives and Swelling  . Fruit & Vegetable Daily [Nutritional Supplements] Shortness Of Breath and Itching    Patient stated that he allergic to most all fruits  . Peanut-Containing Drug Products Anaphylaxis, Hives, Nausea And Vomiting and Swelling  . Shellfish Allergy Anaphylaxis, Hives, Swelling  and Other (See Comments)    "ALL SEAFOOD"  . Tomato Anaphylaxis, Hives and Swelling  . Chicken Meat (Diagnostic) Other (See Comments)    Tested allergic to this years ago  . Latex Itching    No powdered gloves!!  . Pork-Derived Products Other (See Comments)    CAUSES SEVERE HEADACHES  . Soy Allergy Other (See Comments)    Tested allergic to this years ago  . Grass Extracts [Gramineae Pollens] Hives, Rash and Other (See Comments)    Wheezing also  . Tree Extract Other (See Comments)    Patient tested allergic to this years ago       Objective:    BP 114/71   Pulse (!) 55   Temp 98.2 F (36.8 C) (Oral)   Resp 16   Ht 5\' 11"  (1.803 m)   Wt 215 lb 12.8 oz (97.9 kg)   SpO2 98%   BMI 30.10 kg/m  Wt Readings from Last 3 Encounters:  09/25/17 215 lb 12.8 oz (97.9 kg)  10/02/16 223 lb 3.2 oz (101.2 kg)  09/26/16 220 lb (99.8 kg)    Physical Exam  Constitutional: He is oriented to person, place, and time. He appears well-developed and well-nourished. He is cooperative.  HENT:    Head: Normocephalic and atraumatic.  Eyes: EOM are normal.  Neck: Normal range of motion.  Cardiovascular: Regular rhythm, normal heart sounds and intact distal pulses. Bradycardia present. Exam reveals no gallop and no friction rub.  No murmur heard. Pulmonary/Chest: Effort normal and breath sounds normal. No tachypnea. No respiratory distress. He has no decreased breath sounds. He has no wheezes. He has no rhonchi. He has no rales. He exhibits no tenderness.  Abdominal: Soft. Bowel sounds are normal.  Musculoskeletal: Normal range of motion. He exhibits no edema.  Neurological: He is alert and oriented to person, place, and time. Coordination normal.  Skin: Skin is warm, dry and intact. Rash noted. Rash is macular.  Severe atopic dermatitis of bilateral hands. No signs of infection. Skin is dry and intact with significant scarring.   Psychiatric: He has a normal mood and affect. His behavior is normal. Judgment and thought content normal.  Nursing note and vitals reviewed.      Patient has been counseled extensively about nutrition and exercise as well as the importance of adherence with medications and regular follow-up. The patient was given clear instructions to go to ER or return to medical center if symptoms don't improve, worsen or new problems develop. The patient verbalized understanding.   Follow-up: No follow-ups on file.   Gildardo Pounds, FNP-BC [facility] and [facility] [place], [place]   09/25/2017, 1:37 PM

## 2017-09-26 LAB — HIV ANTIBODY (ROUTINE TESTING W REFLEX): HIV Screen 4th Generation wRfx: NONREACTIVE

## 2017-09-28 ENCOUNTER — Telehealth: Payer: Self-pay

## 2017-09-28 LAB — URINE CYTOLOGY ANCILLARY ONLY
Chlamydia: NEGATIVE
Neisseria Gonorrhea: NEGATIVE
Trichomonas: NEGATIVE

## 2017-09-28 NOTE — Telephone Encounter (Signed)
CMA attempt to call patient to inform on lab results.  Patient's emergency contact (Mother) was inform of the results and will relay the message to patient.

## 2017-09-28 NOTE — Telephone Encounter (Signed)
-----   Message from Gildardo Pounds, NP sent at 09/27/2017  7:52 PM EDT ----- HIV test was negative

## 2017-09-29 ENCOUNTER — Telehealth: Payer: Self-pay | Admitting: *Deleted

## 2017-09-29 NOTE — Telephone Encounter (Signed)
Unable to reach patient. Left message for patient to return call with mother.   Notes recorded by Gildardo Pounds, NP on 09/28/2017 at 5:17 PM EDT Urine negative for gonorrhea/chlamydia or trichomonas ------  Notes recorded by Gildardo Pounds, NP on 09/27/2017 at 7:52 PM EDT HIV test was negative

## 2017-09-29 NOTE — Telephone Encounter (Signed)
Patient called back I informed him that his test results where negative and he said he had already came and picked them up the other day.

## 2017-09-30 LAB — URINE CYTOLOGY ANCILLARY ONLY: Candida vaginitis: NEGATIVE

## 2017-12-16 ENCOUNTER — Encounter: Payer: Self-pay | Admitting: Nurse Practitioner

## 2017-12-16 ENCOUNTER — Ambulatory Visit: Payer: Self-pay | Attending: Nurse Practitioner | Admitting: Nurse Practitioner

## 2017-12-16 VITALS — BP 120/76 | HR 56 | Temp 98.5°F | Ht 71.0 in | Wt 204.0 lb

## 2017-12-16 DIAGNOSIS — L301 Dyshidrosis [pompholyx]: Secondary | ICD-10-CM | POA: Insufficient documentation

## 2017-12-16 DIAGNOSIS — J4520 Mild intermittent asthma, uncomplicated: Secondary | ICD-10-CM

## 2017-12-16 DIAGNOSIS — Z79899 Other long term (current) drug therapy: Secondary | ICD-10-CM | POA: Insufficient documentation

## 2017-12-16 DIAGNOSIS — Z91013 Allergy to seafood: Secondary | ICD-10-CM | POA: Insufficient documentation

## 2017-12-16 DIAGNOSIS — J45909 Unspecified asthma, uncomplicated: Secondary | ICD-10-CM | POA: Insufficient documentation

## 2017-12-16 DIAGNOSIS — Z8249 Family history of ischemic heart disease and other diseases of the circulatory system: Secondary | ICD-10-CM | POA: Insufficient documentation

## 2017-12-16 MED ORDER — ALBUTEROL SULFATE HFA 108 (90 BASE) MCG/ACT IN AERS: 2.0000 | INHALATION_SPRAY | Freq: Four times a day (QID) | RESPIRATORY_TRACT | 1 refills | Status: DC | PRN

## 2017-12-16 MED ORDER — PREDNISONE 50 MG PO TABS: ORAL_TABLET | ORAL | 0 refills | Status: DC

## 2017-12-16 MED ORDER — BETAMETHASONE VALERATE 0.1 % EX OINT: 1.0000 "application " | TOPICAL_OINTMENT | Freq: Two times a day (BID) | CUTANEOUS | 1 refills | Status: DC

## 2017-12-16 MED FILL — predniSONE 10 MG TABS: 10 | 5 days supply | Qty: 25 | Fill #0

## 2017-12-16 MED FILL — BETAMETHASONE VALER 0.1% OI: 0.1 | 7 days supply | Qty: 30 | Fill #0

## 2017-12-16 MED FILL — !VENTOLIN HFA INHALER: 108 (90 BAS | 25 days supply | Qty: 18 | Fill #0

## 2017-12-16 NOTE — Progress Notes (Signed)
Assessment & Plan:  Adrian [name] was seen today for eczema.  Diagnoses and all orders for this visit:  Eczema, dyshidrotic -     predniSONE (DELTASONE) 50 MG tablet; Take one tablet by mouth daily for 5 days. -     betamethasone valerate ointment (VALISONE) 0.1 %; Apply 1 application topically 2 (two) times daily.  Asthma in adult, mild intermittent, uncomplicated -     albuterol (VENTOLIN HFA) 108 (90 Base) MCG/ACT inhaler; Inhale 2 puffs into the lungs every 6 (six) hours as needed for wheezing or shortness of breath (cough). Chronic and well controlled with sparing use of albuterol inhaler   Patient has been counseled on age-appropriate routine health concerns for screening and prevention. These are reviewed and up-to-date. Referrals have been placed accordingly. Immunizations are up-to-date or declined.    Subjective:   Chief Complaint  Patient presents with  . Eczema    Pt. stated he have eczema on both of hands and it gives him pain.    HPI Adrian [name] 42 y.o. male presents to office today with complaints of severe pruritic skin rash which has been resistant to treatment with triamcinolone cream in the past. He has never been evaluated by a dermatologist. I have instructed him to follow up with the financial counselor and apply for the financial assistance so that he may be referred to dermatology.   Eczema Patient complains of rash. Onset of symptoms several years ago, and have been gradually worsening since that time.  Risk factors include history of asthma and environmental allergies, works in a kitchen washing dishes. Treatment modalities that have been used in the past include: prednisone, triamcinolone acetonide, hot water and rubbing alcohol.  Prednisone has provided moderate relief of his symptoms.    Review of Systems  Constitutional: Negative for fever, malaise/fatigue and weight loss.  Respiratory: Negative.  Negative for cough, shortness of breath and wheezing.     Cardiovascular: Negative.  Negative for chest pain, palpitations and leg swelling.  Skin: Positive for itching and rash.  Neurological: Negative.  Negative for weakness and headaches.  Endo/Heme/Allergies: Positive for environmental allergies.  Psychiatric/Behavioral: Negative.  Negative for suicidal ideas.    Past Medical History:  Diagnosis Date  . 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 Eczema   . Environmental allergies    grass  . Foot fracture, left   . Heart murmur   . Migraine   . Multiple food allergies    fish, tomatoes, peanuts, eggs, chicken and others    Past Surgical History:  Procedure Laterality Date  . INGUINAL HERNIA REPAIR  1994    Family History  Problem Relation Age of Onset  . Asthma Father   . Diabetes Mother   . Hypertension Mother   . Clotting disorder Unknown        Aunt  . Breast cancer Unknown        Grandmother  . Prostate cancer Unknown        uncle  . Hyperlipidemia Unknown        grandparent  . Hypertension Unknown        grandparent/grandparent  . Kidney disease Unknown        aunt/uncle/grandparent  . Stroke Unknown        grandparent    Social History Reviewed with no changes to be made today.   Outpatient Medications Prior to Visit  Medication Sig Dispense Refill  . cetirizine (ZYRTEC) 10 MG tablet Take 1 tablet (10 mg total)  by mouth daily. 30 tablet 11  . fluticasone (FLONASE) 50 MCG/ACT nasal spray Place 2 sprays into both nostrils daily. 16 g 6  . fluticasone (FLOVENT HFA) 44 MCG/ACT inhaler Inhale 2 puffs into the lungs 2 (two) times daily. 1 Inhaler 12  . triamcinolone ointment (KENALOG) 0.5 % Apply topically 2 (two) times daily. 80 g 3  . albuterol (VENTOLIN HFA) 108 (90 Base) MCG/ACT inhaler Inhale 2 puffs into the lungs every 6 (six) hours as needed for wheezing or shortness of breath (cough). 18 g 1  . acetaminophen (TYLENOL) 500 MG tablet Take 500-1,000 mg by mouth every 6 (six) hours as needed (for pain).    . calcium carbonate  (TUMS - DOSED IN MG ELEMENTAL CALCIUM) 500 MG chewable tablet Chew 1-2 tablets by mouth as needed for indigestion or heartburn.     No facility-administered medications prior to visit.     Allergies  Allergen Reactions  . Apple Shortness Of Breath, Itching, Nausea Only and Swelling    Tongue itches also  . Atrovent [Ipratropium] Anaphylaxis    Atrovent contains peanut oil and PT has a HX of anaphlaxis to peanuts  . Banana Shortness Of Breath and Itching    Makes tongue itch  . Carrot Oil Shortness Of Breath and Itching  . Fish Allergy Anaphylaxis, Hives and Swelling  . Fruit & Vegetable Daily [Nutritional Supplements] Shortness Of Breath and Itching    Patient stated that he allergic to most all fruits  . Peanut-Containing Drug Products Anaphylaxis, Hives, Nausea And Vomiting and Swelling  . Shellfish Allergy Anaphylaxis, Hives, Swelling and Other (See Comments)    "ALL SEAFOOD"  . Tomato Anaphylaxis, Hives and Swelling  . Chicken Meat (Diagnostic) Other (See Comments)    Tested allergic to this years ago  . Latex Itching    No powdered gloves!!  . Pork-Derived Products Other (See Comments)    CAUSES SEVERE HEADACHES  . Soy Allergy Other (See Comments)    Tested allergic to this years ago  . Grass Extracts [Gramineae Pollens] Hives, Rash and Other (See Comments)    Wheezing also  . Tree Extract Other (See Comments)    Patient tested allergic to this years ago       Objective:    BP 120/76 (BP Location: Left Arm, Patient Position: Sitting, Cuff Size: Normal)   Pulse (!) 56   Temp 98.5 F (36.9 C) (Oral)   Ht 5\' 11"  (1.803 m)   Wt 204 lb (92.5 kg)   SpO2 100%   BMI 28.45 kg/m  Wt Readings from Last 3 Encounters:  12/16/17 204 lb (92.5 kg)  09/25/17 215 lb 12.8 oz (97.9 kg)  10/02/16 223 lb 3.2 oz (101.2 kg)    Physical Exam  Constitutional: He is oriented to person, place, and time. He appears well-developed and well-nourished. He is cooperative.  HENT:  Head:  Normocephalic and atraumatic.  Eyes: EOM are normal.  Cardiovascular: Normal rate, regular rhythm, normal heart sounds and intact distal pulses. Exam reveals no gallop and no friction rub.  No murmur heard. Pulmonary/Chest: Effort normal and breath sounds normal. No tachypnea. No respiratory distress. He has no decreased breath sounds. He has no wheezes. He has no rhonchi. He has no rales. He exhibits no tenderness.  Neurological: He is alert and oriented to person, place, and time. Coordination normal.  Skin: Skin is warm and dry. Rash noted.  There is a dry scaly white rash on back of bilateral hands as well as  the palmar aspect of the hands.   Psychiatric: He has a normal mood and affect. His behavior is normal. Judgment and thought content normal.  Nursing note and vitals reviewed.     Patient has been counseled extensively about nutrition and exercise as well as the importance of adherence with medications and regular follow-up. The patient was given clear instructions to go to ER or return to medical center if symptoms don't improve, worsen or new problems develop. The patient verbalized understanding.   Follow-up: Return in about 1 month (around 01/13/2018) for eczema.   Gildardo Pounds, FNP-BC [facility] and [place], [place]   12/20/2017, 10:54 PM

## 2017-12-16 NOTE — Patient Instructions (Signed)
Dyshidrotic Eczema Dyshidrotic eczema (pompholyx) is a type of eczema that causes very itchy (pruritic), fluid-filled blisters (vesicles) to form on the hands and feet. It can affect people of any age, but is more common before the age of 7. There is no cure, but treatment and certain lifestyle changes can help relieve symptoms. What are the causes? The cause of this condition is not known. What increases the risk? You are more likely to develop this condition if:  You wash your hands frequently.  You have a personal history or family history of eczema, allergies, asthma, or hay fever.  You are allergic to metals such as nickel or cobalt.  You work with cement.  You smoke.  What are the signs or symptoms? Symptoms of this condition may affect the hands, feet, or both. Symptoms may come and go (recur), and may include:  Severe itching, which may happen before blisters appear.  Blisters. These may form suddenly. ? In the early stages, blisters may form near the fingertips. ? In severe cases, blisters may grow to large blister masses (bullae). ? Blisters resolve in 2-3 weeks without bursting. This is followed by a dry phase in which itching eases.  Pain and swelling.  Cracks or long, narrow openings (fissures) in the skin.  Severe dryness.  Ridges on the nails.  How is this diagnosed? This condition may be diagnosed based on:  A physical exam.  Your symptoms.  Your medical history.  Skin scrapings to rule out a fungal infection.  Testing a swab of fluid for bacteria (culture).  Removing and checking a small piece of skin (biopsy) in order to test for infection or to rule out other conditions.  Skin patch tests. These tests involve taking patches that contain possible allergens and placing them on your back. Your health care provider will wait a few days and then check to see if an allergic reaction occurred. These tests may be done if your health care provider suspects  allergic reactions, or to rule out other types of eczema.  You may be referred to a health care provider who specializes in the skin (dermatologist) to help diagnose and treat this condition. How is this treated? There is no cure for this condition, but treatment can help relieve symptoms. Depending on how many blisters you have and how severe they are, your health care provider may suggest:  Avoiding allergens, irritants, or triggers that worsen symptoms. This may involve lifestyle changes such as: ? Using different lotions or soaps. ? Avoiding hot weather or places that will cause you to sweat a lot. ? Managing stress with coping techniques such as relaxation and exercise, and asking for help when you need it. ? Diet changes as recommended by your health care provider.  Using a clean, damp towel (cool compress) to relieve symptoms.  Soaking in a bath that contains a type of salt that relieves irritation (aluminum acetate soaks).  Medicine taken by mouth to reduce itching (oral antihistamines).  Medicine applied to the skin to reduce swelling and irritation (topical corticosteroids).  Medicine that reduces the activity of the body's disease-fighting system (immunosuppressants) to treat inflammation. This may be given in severe cases.  Antibiotic medicines to treat bacterial infection.  Light therapy (phototherapy). This involves shining ultraviolet (UV) light on affected skin in order to reduce itchiness and inflammation.  Follow these instructions at home: Bathing and skin care  Wash skin gently. After bathing or washing your hands, pat your skin dry. Avoid rubbing your  skin.  Remove all jewelry before bathing. If the skin under the jewelry stays wet, blisters may form or get worse.  Apply cool compresses as told by your health care provider: ? Soak a clean towel in cool water. ? Wring out excess water until towel is damp. ? Place the towel over affected skin. Leave the towel on  for 20 minutes at a time, 2-3 times a day.  Use mild soaps, cleansers, and lotions that do not contain dyes, perfumes, or other irritants.  Keep your skin hydrated. To do this: ? Avoid very hot water. Take lukewarm baths or showers. ? Apply moisturizer within three minutes of bathing. This locks in moisture. Medicines  Take and apply over-the-counter and prescription medicines only as told by your health care provider.  If you were prescribed antibiotic medicine, take or apply it as told by your health care provider. Do not stop using the antibiotic even if you start to feel better. General instructions  Identify and avoid triggers and allergens.  Keep fingernails short to avoid breaking open the skin while scratching.  Use waterproof gloves to protect your hands when doing work that keeps your hands wet for a long time.  Wear socks to keep your feet dry.  Do not use any products that contain nicotine or tobacco, such as cigarettes and e-cigarettes. If you need help quitting, ask your health care provider.  Keep all follow-up visits as told by your health care provider. This is important. Contact a health care provider if:  You have symptoms that do not go away.  You have signs of infection, such as: ? Crusting, pus, or a bad smell. ? More redness, swelling, or pain. ? Increased warmth in the affected area. Summary  Dyshidrotic eczema (pompholyx) is a type of eczema that causes very itchy (pruritic), fluid-filled blisters (vesicles) to form on the hands and feet.  The cause of this condition is not known.  There is no cure for this condition, but treatment can help relieve symptoms. Treatment depends on how many blisters you have and how severe they are.  Use mild soaps, cleansers, and lotions that do not contain dyes, perfumes, or other irritants. Keep your skin hydrated. This information is not intended to replace advice given to you by your health care provider. Make sure  you discuss any questions you have with your health care provider. Document Released: 10/30/2016 Document Revised: 10/30/2016 Document Reviewed: 10/30/2016 Elsevier Interactive Patient Education  2018 [employer].

## 2017-12-20 ENCOUNTER — Encounter: Payer: Self-pay | Admitting: Nurse Practitioner

## 2018-01-15 ENCOUNTER — Ambulatory Visit: Payer: Self-pay | Admitting: Nurse Practitioner

## 2018-02-09 IMAGING — CR DG SHOULDER 2+V*L*
3 series · 3 of 3 positions shown · non-contrast
Comparison: None.

CLINICAL DATA: Status post fall, with left shoulder pain. Initial
encounter.

EXAM:
LEFT SHOULDER - 2+ VIEW

[shoulder grashey]
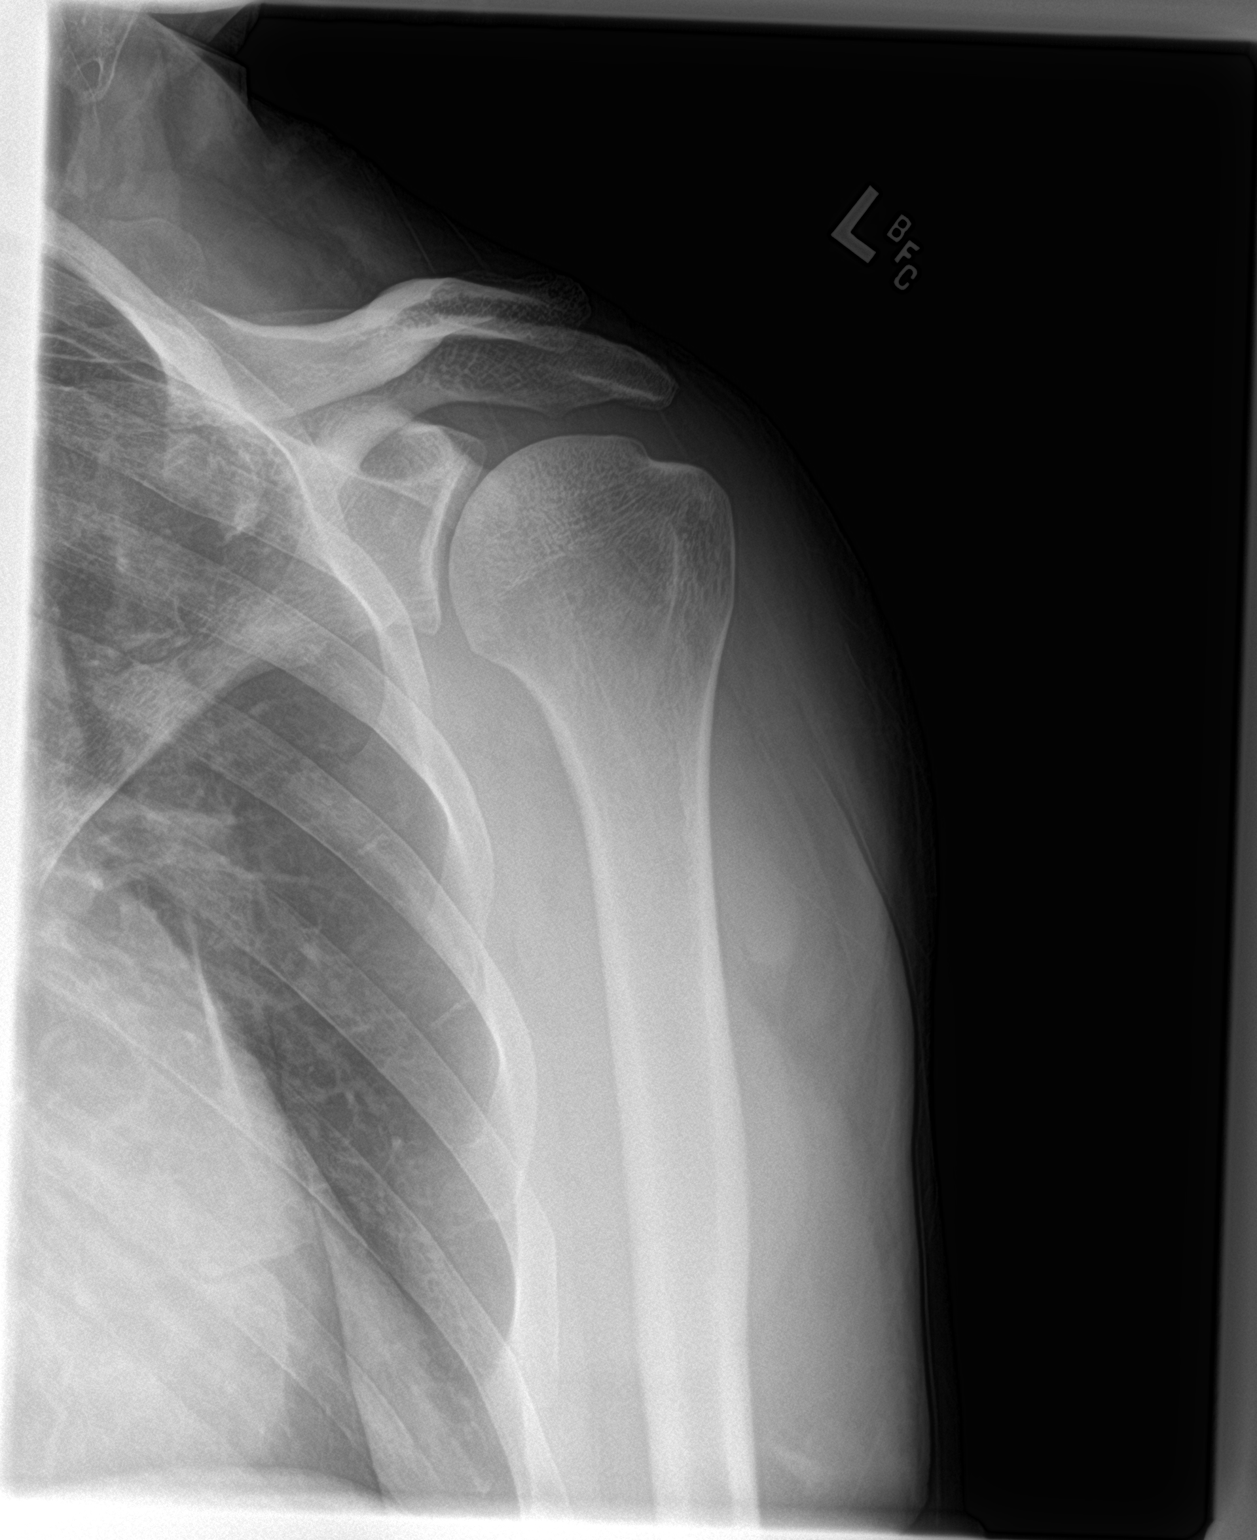

[shoulder y view]
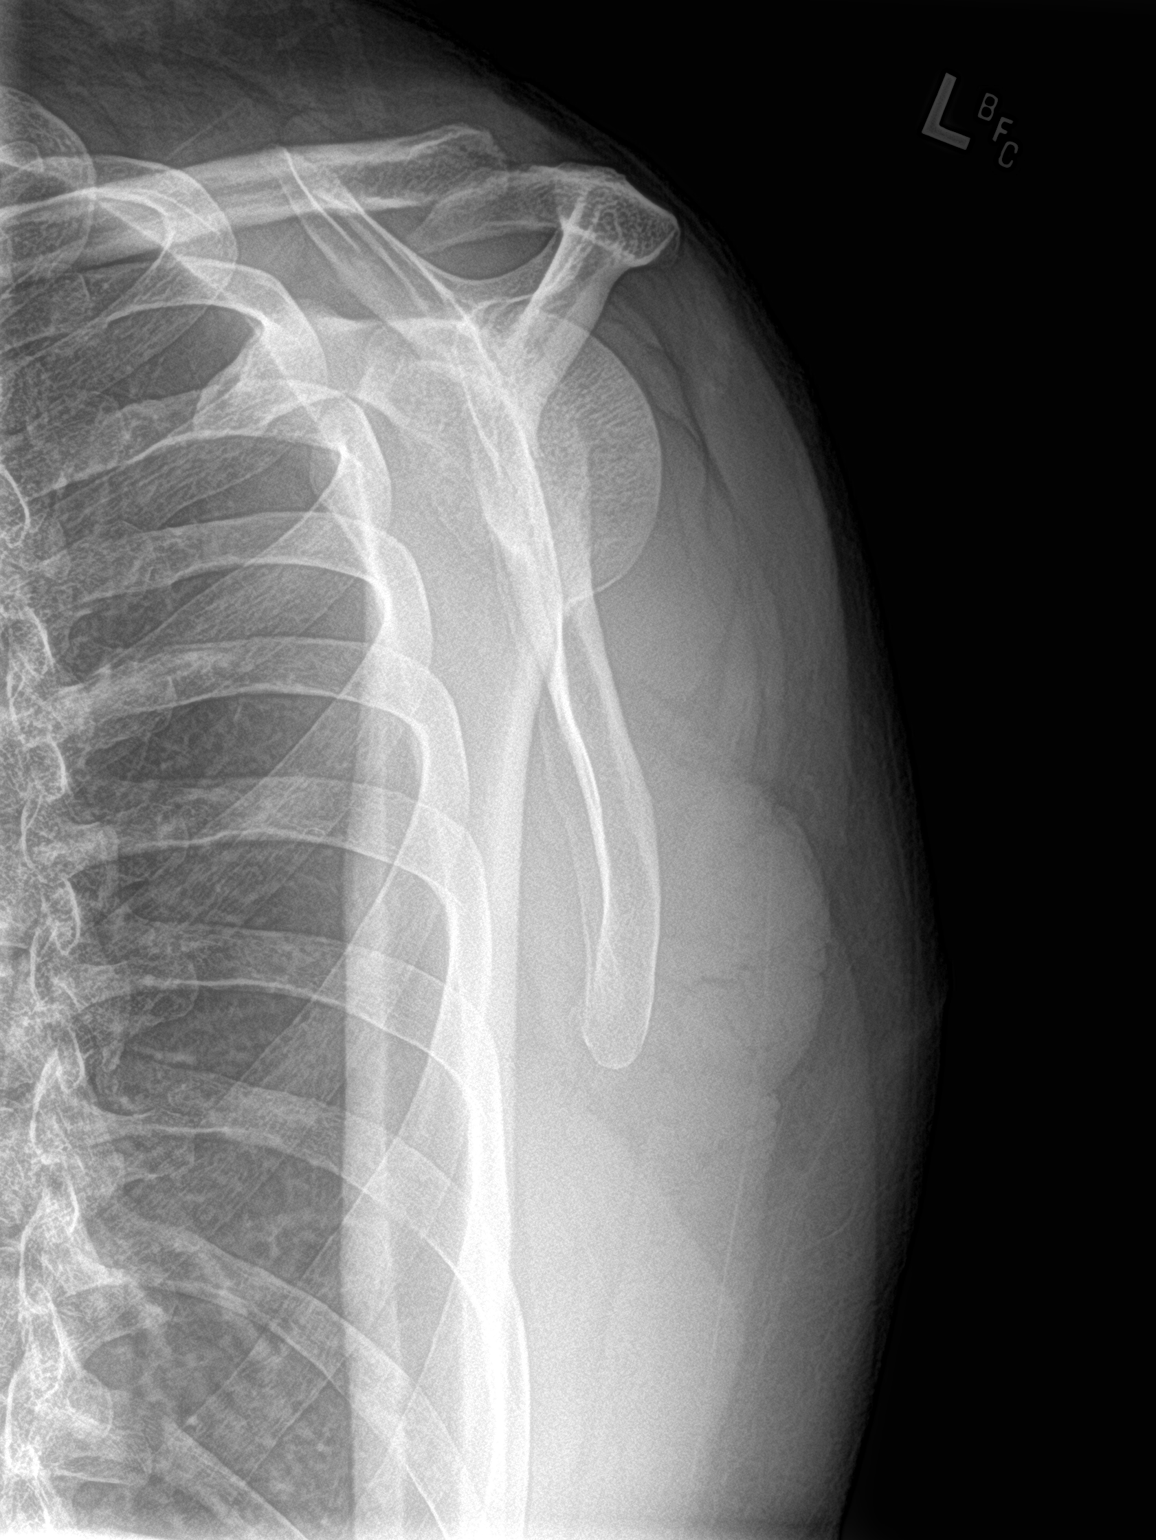

[shoulder ap neutral]
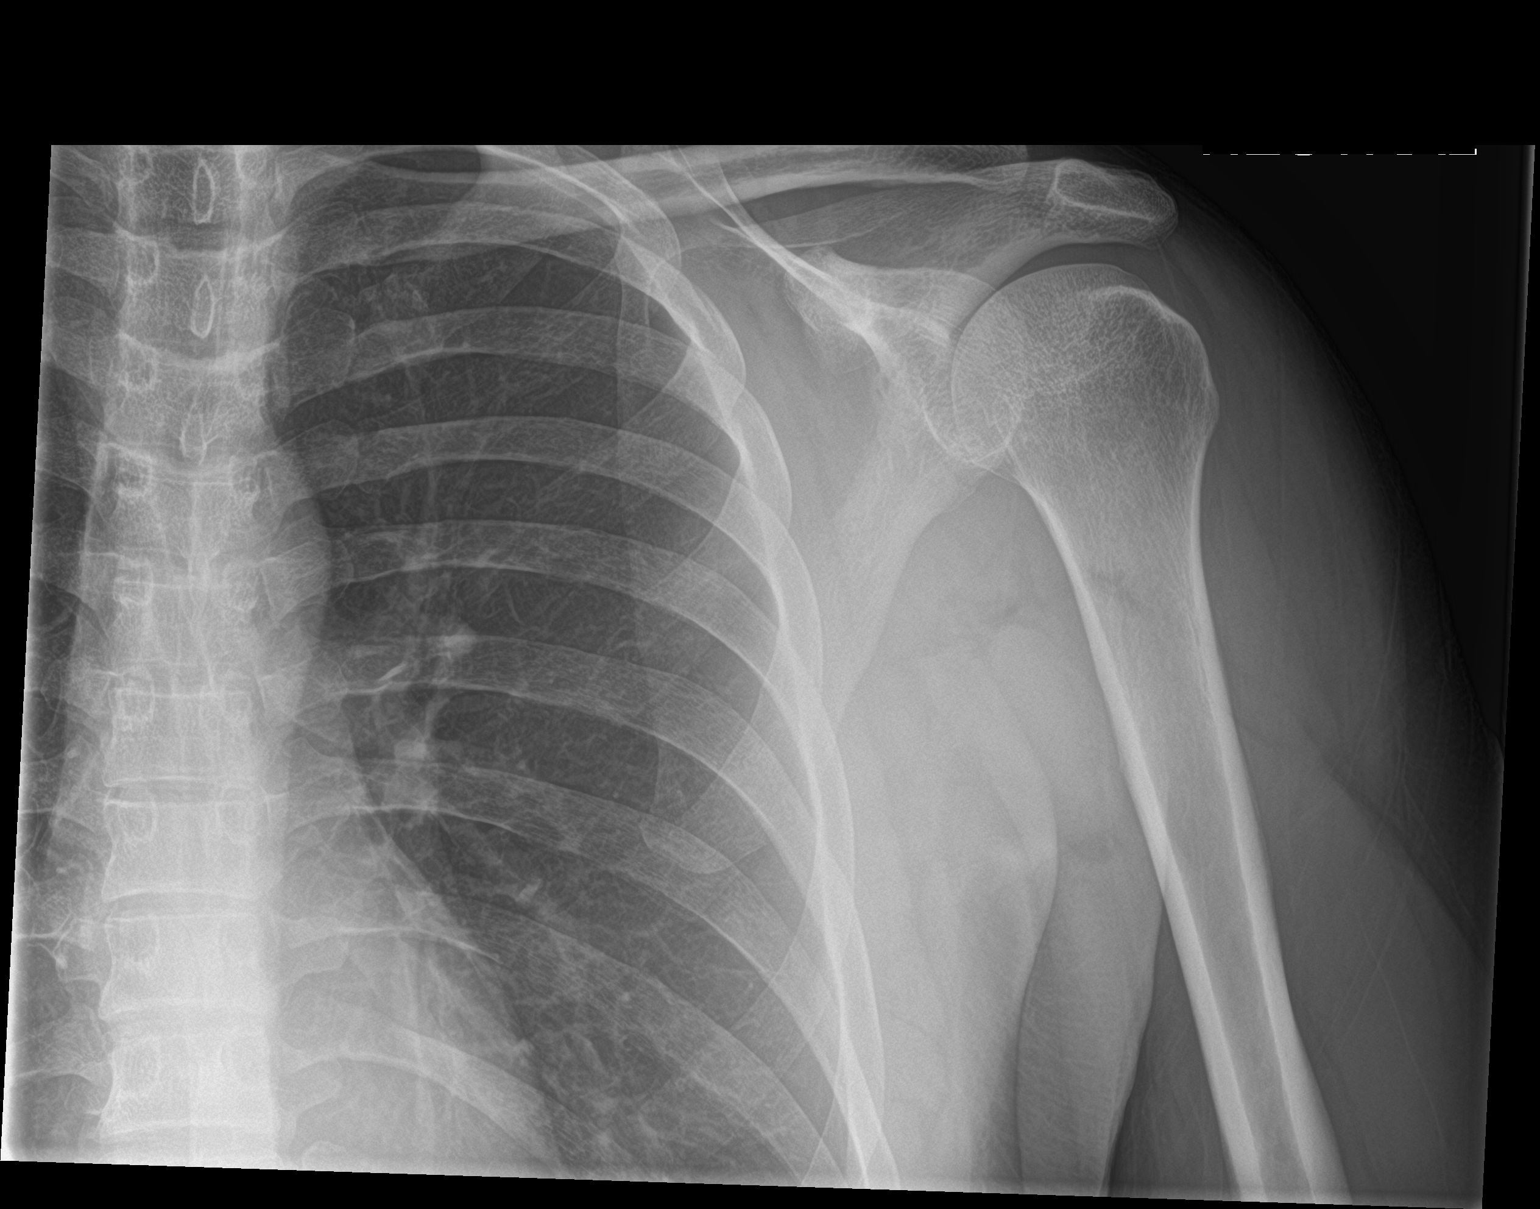

[3 of 3 positions shown; findings below may reference images not displayed]

FINDINGS: There is no evidence of fracture or dislocation. The left humeral
head is seated within the glenoid fossa. Mild degenerative change is
noted at the left acromioclavicular joint. No significant soft
tissue abnormalities are seen. The visualized portions of the left
lung are clear.
IMPRESSION: No evidence of fracture or dislocation.

## 2018-02-17 MED FILL — TRIAMCINOLONE 0.5% OINTMENT: 0.5 | 15 days supply | Qty: 30 | Fill #1

## 2018-02-17 MED FILL — BETAMETHASONE VALER 0.1% OI: 0.1 | 7 days supply | Qty: 30 | Fill #1

## 2018-02-17 MED FILL — !VENTOLIN HFA INHALER: 108 (90 BAS | 25 days supply | Qty: 18 | Fill #1

## 2018-04-12 ENCOUNTER — Encounter: Payer: Self-pay | Admitting: Nurse Practitioner

## 2018-04-12 ENCOUNTER — Ambulatory Visit: Payer: Self-pay | Attending: Nurse Practitioner | Admitting: Nurse Practitioner

## 2018-04-12 VITALS — BP 124/78 | HR 50 | Temp 98.3°F | Resp 16 | Ht 71.0 in | Wt 198.8 lb

## 2018-04-12 DIAGNOSIS — Z8249 Family history of ischemic heart disease and other diseases of the circulatory system: Secondary | ICD-10-CM | POA: Insufficient documentation

## 2018-04-12 DIAGNOSIS — Z79899 Other long term (current) drug therapy: Secondary | ICD-10-CM | POA: Insufficient documentation

## 2018-04-12 DIAGNOSIS — J45909 Unspecified asthma, uncomplicated: Secondary | ICD-10-CM | POA: Insufficient documentation

## 2018-04-12 DIAGNOSIS — Z833 Family history of diabetes mellitus: Secondary | ICD-10-CM | POA: Insufficient documentation

## 2018-04-12 DIAGNOSIS — L301 Dyshidrosis [pompholyx]: Secondary | ICD-10-CM | POA: Insufficient documentation

## 2018-04-12 DIAGNOSIS — Z9889 Other specified postprocedural states: Secondary | ICD-10-CM | POA: Insufficient documentation

## 2018-04-12 NOTE — Addendum Note (Signed)
Addended by: Geryl Rankins on: 04/12/2018 05:07 PM   Modules accepted: Level of Service

## 2018-04-12 NOTE — Progress Notes (Signed)
Needs relief for rash on hands and feet. Cracking and burning discomfort. H/o eczema.  Discomfort- 10/10

## 2018-04-12 NOTE — Progress Notes (Signed)
Assessment & Plan:  Adrian [name] was seen today for rash.  Diagnoses and all orders for this visit:  Dyshidrotic eczema Patient has been advised to apply for financial assistance and schedule to see our financial counselor.  He does not wish to continue any of the previous creams. He needs to be referred to Dermatology.   Patient has been counseled on age-appropriate routine health concerns for screening and prevention. These are reviewed and up-to-date. Referrals have been placed accordingly. Immunizations are up-to-date or declined.    Subjective:   Chief Complaint  Patient presents with  . Rash   HPI Adrian [name] 42 y.o. male presents to office today for follow up to skin disorder.  Dyshidrotic Eczema Patient has not been evaluated in this office since 12/16/2017 for his skin condition.  He was a no-show on 01/15/2018 for follow-up.  He has been prescribed multiple creams/ointments in the past for his bilateral hands including betamethasone, triamcinolone, Cetaphil, and fluocinolone.  None of which she reports has been effective in treating his skin disorder.  I have recommended on several occasions that he apply for the financial assistance here in this office in order to be referred to dermatology.  As of today he still has not made an appointment nor has he filled out any financial assistance forms.  He is requesting that I give him a letter to be given to his attorney stating that he is not able to work.  He states he is "going to be locked up for not paying child support" and needs this letter stating he cannot work.  I have instructed him that I cannot give him a letter stating that he cannot work as his current diagnosis of dyshidrotic eczema does not prevent him from working.  He still needs to be evaluated by dermatology to determine what type of skin disorder he truly has.   Compared to his last office visit with me on 12/16/2017 his skin condition appears to have improved however he  states it has only improved because he continuously keeps his hand in water. He then states he has cracks in the skin of both feet however when I request to look at his feet he states "well it's not there right now but it was before".   Review of Systems  Constitutional: Negative for fever, malaise/fatigue and weight loss.  HENT: Negative.  Negative for nosebleeds.   Eyes: Negative.  Negative for blurred vision, double vision and photophobia.  Respiratory: Negative.  Negative for cough and shortness of breath.   Cardiovascular: Negative.  Negative for chest pain, palpitations and leg swelling.  Gastrointestinal: Negative.  Negative for heartburn, nausea and vomiting.  Musculoskeletal: Negative.  Negative for myalgias.  Skin: Positive for itching and rash.  Neurological: Negative.  Negative for dizziness, focal weakness, seizures and headaches.  Psychiatric/Behavioral: Negative.  Negative for suicidal ideas.    Past Medical History:  Diagnosis Date  . 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 Eczema   . Environmental allergies    grass  . Foot fracture, left   . Heart murmur   . Migraine   . Multiple food allergies    fish, tomatoes, peanuts, eggs, chicken and others    Past Surgical History:  Procedure Laterality Date  . INGUINAL HERNIA REPAIR  1994    Family History  Problem Relation Age of Onset  . Asthma Father   . Diabetes Mother   . Hypertension Mother   . Clotting disorder Unknown  Aunt  . Breast cancer Unknown        Grandmother  . Prostate cancer Unknown        uncle  . Hyperlipidemia Unknown        grandparent  . Hypertension Unknown        grandparent/grandparent  . Kidney disease Unknown        aunt/uncle/grandparent  . Stroke Unknown        grandparent    Social History Reviewed with no changes to be made today.   Outpatient Medications Prior to Visit  Medication Sig Dispense Refill  . acetaminophen (TYLENOL) 500 MG tablet Take 500-1,000 mg by mouth every 6  (six) hours as needed (for pain).    Marland Kitchen albuterol (VENTOLIN HFA) 108 (90 Base) MCG/ACT inhaler Inhale 2 puffs into the lungs every 6 (six) hours as needed for wheezing or shortness of breath (cough). 18 g 1  . betamethasone valerate ointment (VALISONE) 0.1 % Apply 1 application topically 2 (two) times daily. 30 g 1  . calcium carbonate (TUMS - DOSED IN MG ELEMENTAL CALCIUM) 500 MG chewable tablet Chew 1-2 tablets by mouth as needed for indigestion or heartburn.    . cetirizine (ZYRTEC) 10 MG tablet Take 1 tablet (10 mg total) by mouth daily. 30 tablet 11  . fluticasone (FLONASE) 50 MCG/ACT nasal spray Place 2 sprays into both nostrils daily. 16 g 6  . fluticasone (FLOVENT HFA) 44 MCG/ACT inhaler Inhale 2 puffs into the lungs 2 (two) times daily. 1 Inhaler 12  . triamcinolone ointment (KENALOG) 0.5 % Apply topically 2 (two) times daily. 80 g 3  . predniSONE (DELTASONE) 50 MG tablet Take one tablet by mouth daily for 5 days. (Patient not taking: Reported on 04/12/2018) 5 tablet 0   No facility-administered medications prior to visit.     Allergies  Allergen Reactions  . Apple Shortness Of Breath, Itching, Nausea Only and Swelling    Tongue itches also  . Atrovent [Ipratropium] Anaphylaxis    Atrovent contains peanut oil and PT has a HX of anaphlaxis to peanuts  . Banana Shortness Of Breath and Itching    Makes tongue itch  . Carrot Oil Shortness Of Breath and Itching  . Fish Allergy Anaphylaxis, Hives and Swelling  . Fruit & Vegetable Daily [Nutritional Supplements] Shortness Of Breath and Itching    Patient stated that he allergic to most all fruits  . Peanut-Containing Drug Products Anaphylaxis, Hives, Nausea And Vomiting and Swelling  . Shellfish Allergy Anaphylaxis, Hives, Swelling and Other (See Comments)    "ALL SEAFOOD"  . Tomato Anaphylaxis, Hives and Swelling  . Chicken Meat (Diagnostic) Other (See Comments)    Tested allergic to this years ago  . Latex Itching    No powdered  gloves!!  . Pork-Derived Products Other (See Comments)    CAUSES SEVERE HEADACHES  . Soy Allergy Other (See Comments)    Tested allergic to this years ago  . Grass Extracts [Gramineae Pollens] Hives, Rash and Other (See Comments)    Wheezing also  . Tree Extract Other (See Comments)    Patient tested allergic to this years ago       Objective:    BP 124/78 (BP Location: Left Arm, Patient Position: Sitting, Cuff Size: Large)   Pulse (!) 50   Temp 98.3 F (36.8 C) (Oral)   Resp 16   Ht 5\' 11"  (1.803 m)   Wt 198 lb 12.8 oz (90.2 kg)   SpO2 99%   BMI  27.73 kg/m  Wt Readings from Last 3 Encounters:  04/12/18 198 lb 12.8 oz (90.2 kg)  12/16/17 204 lb (92.5 kg)  09/25/17 215 lb 12.8 oz (97.9 kg)    Physical Exam  Constitutional: He is oriented to person, place, and time. He appears well-developed and well-nourished. He is cooperative.  HENT:  Head: Normocephalic and atraumatic.  Eyes: EOM are normal.  Neck: Normal range of motion.  Cardiovascular: Normal rate, regular rhythm and normal heart sounds. Exam reveals no gallop and no friction rub.  No murmur heard. Pulmonary/Chest: Effort normal and breath sounds normal. No tachypnea. No respiratory distress. He has no decreased breath sounds. He has no wheezes. He has no rhonchi. He has no rales. He exhibits no tenderness.  Abdominal: Soft. Bowel sounds are normal.  Musculoskeletal: Normal range of motion. He exhibits no edema, tenderness or deformity.       Right hand: Normal. He exhibits no deformity, no laceration and no swelling. Normal sensation noted. Normal strength noted.       Left hand: Normal. He exhibits no deformity, no laceration and no swelling. Normal sensation noted. Normal strength noted.  Bilateral hands are noted for extremely rough dry skin. Skin is intact. There are no obvious signs of infection including erythema, drainage, warmth or swelling.   Neurological: He is alert and oriented to person, place, and time.  Coordination normal.  Skin: Skin is warm and dry. No erythema. No pallor.  Psychiatric: He has a normal mood and affect. His behavior is normal. Judgment and thought content normal.  Nursing note and vitals reviewed.        Patient has been counseled extensively about nutrition and exercise as well as the importance of adherence with medications and regular follow-up. The patient was given clear instructions to go to ER or return to medical center if symptoms don't improve, worsen or new problems develop. The patient verbalized understanding.   Follow-up: No follow-ups on file.   Gildardo Pounds, FNP-BC [facility] [place], [place]   04/12/2018, 4:09 PM

## 2018-04-12 NOTE — Patient Instructions (Signed)
Dyshidrotic Eczema Dyshidrotic eczema (pompholyx) is a type of eczema that causes very itchy (pruritic), fluid-filled blisters (vesicles) to form on the hands and feet. It can affect people of any age, but is more common before the age of 80. There is no cure, but treatment and certain lifestyle changes can help relieve symptoms. What are the causes? The cause of this condition is not known. What increases the risk? You are more likely to develop this condition if:  You wash your hands frequently.  You have a personal history or family history of eczema, allergies, asthma, or hay fever.  You are allergic to metals such as nickel or cobalt.  You work with cement.  You smoke.  What are the signs or symptoms? Symptoms of this condition may affect the hands, feet, or both. Symptoms may come and go (recur), and may include:  Severe itching, which may happen before blisters appear.  Blisters. These may form suddenly. ? In the early stages, blisters may form near the fingertips. ? In severe cases, blisters may grow to large blister masses (bullae). ? Blisters resolve in 2-3 weeks without bursting. This is followed by a dry phase in which itching eases.  Pain and swelling.  Cracks or long, narrow openings (fissures) in the skin.  Severe dryness.  Ridges on the nails.  How is this diagnosed? This condition may be diagnosed based on:  A physical exam.  Your symptoms.  Your medical history.  Skin scrapings to rule out a fungal infection.  Testing a swab of fluid for bacteria (culture).  Removing and checking a small piece of skin (biopsy) in order to test for infection or to rule out other conditions.  Skin patch tests. These tests involve taking patches that contain possible allergens and placing them on your back. Your health care provider will wait a few days and then check to see if an allergic reaction occurred. These tests may be done if your health care provider suspects  allergic reactions, or to rule out other types of eczema.  You may be referred to a health care provider who specializes in the skin (dermatologist) to help diagnose and treat this condition. How is this treated? There is no cure for this condition, but treatment can help relieve symptoms. Depending on how many blisters you have and how severe they are, your health care provider may suggest:  Avoiding allergens, irritants, or triggers that worsen symptoms. This may involve lifestyle changes such as: ? Using different lotions or soaps. ? Avoiding hot weather or places that will cause you to sweat a lot. ? Managing stress with coping techniques such as relaxation and exercise, and asking for help when you need it. ? Diet changes as recommended by your health care provider.  Using a clean, damp towel (cool compress) to relieve symptoms.  Soaking in a bath that contains a type of salt that relieves irritation (aluminum acetate soaks).  Medicine taken by mouth to reduce itching (oral antihistamines).  Medicine applied to the skin to reduce swelling and irritation (topical corticosteroids).  Medicine that reduces the activity of the body's disease-fighting system (immunosuppressants) to treat inflammation. This may be given in severe cases.  Antibiotic medicines to treat bacterial infection.  Light therapy (phototherapy). This involves shining ultraviolet (UV) light on affected skin in order to reduce itchiness and inflammation.  Follow these instructions at home: Bathing and skin care  Wash skin gently. After bathing or washing your hands, pat your skin dry. Avoid rubbing your  skin.  Remove all jewelry before bathing. If the skin under the jewelry stays wet, blisters may form or get worse.  Apply cool compresses as told by your health care provider: ? Soak a clean towel in cool water. ? Wring out excess water until towel is damp. ? Place the towel over affected skin. Leave the towel on  for 20 minutes at a time, 2-3 times a day.  Use mild soaps, cleansers, and lotions that do not contain dyes, perfumes, or other irritants.  Keep your skin hydrated. To do this: ? Avoid very hot water. Take lukewarm baths or showers. ? Apply moisturizer within three minutes of bathing. This locks in moisture. Medicines  Take and apply over-the-counter and prescription medicines only as told by your health care provider.  If you were prescribed antibiotic medicine, take or apply it as told by your health care provider. Do not stop using the antibiotic even if you start to feel better. General instructions  Identify and avoid triggers and allergens.  Keep fingernails short to avoid breaking open the skin while scratching.  Use waterproof gloves to protect your hands when doing work that keeps your hands wet for a long time.  Wear socks to keep your feet dry.  Do not use any products that contain nicotine or tobacco, such as cigarettes and e-cigarettes. If you need help quitting, ask your health care provider.  Keep all follow-up visits as told by your health care provider. This is important. Contact a health care provider if:  You have symptoms that do not go away.  You have signs of infection, such as: ? Crusting, pus, or a bad smell. ? More redness, swelling, or pain. ? Increased warmth in the affected area. Summary  Dyshidrotic eczema (pompholyx) is a type of eczema that causes very itchy (pruritic), fluid-filled blisters (vesicles) to form on the hands and feet.  The cause of this condition is not known.  There is no cure for this condition, but treatment can help relieve symptoms. Treatment depends on how many blisters you have and how severe they are.  Use mild soaps, cleansers, and lotions that do not contain dyes, perfumes, or other irritants. Keep your skin hydrated. This information is not intended to replace advice given to you by your health care provider. Make sure  you discuss any questions you have with your health care provider. Document Released: 10/30/2016 Document Revised: 10/30/2016 Document Reviewed: 10/30/2016 Elsevier Interactive Patient Education  2018 [employer].

## 2018-05-17 ENCOUNTER — Other Ambulatory Visit: Payer: Self-pay

## 2018-05-17 ENCOUNTER — Emergency Department (HOSPITAL_COMMUNITY): Payer: Self-pay

## 2018-05-17 ENCOUNTER — Encounter (HOSPITAL_COMMUNITY): Payer: Self-pay | Admitting: Emergency Medicine

## 2018-05-17 ENCOUNTER — Emergency Department (HOSPITAL_COMMUNITY)
Admission: EM | Admit: 2018-05-17 | Discharge: 2018-05-17 | Disposition: A | Discharge status: Home / Self Care | Payer: Self-pay | Attending: Emergency Medicine | Admitting: Emergency Medicine

## 2018-05-17 DIAGNOSIS — Y999 Unspecified external cause status: Secondary | ICD-10-CM | POA: Insufficient documentation

## 2018-05-17 DIAGNOSIS — R0781 Pleurodynia: Secondary | ICD-10-CM | POA: Insufficient documentation

## 2018-05-17 DIAGNOSIS — J45909 Unspecified asthma, uncomplicated: Secondary | ICD-10-CM | POA: Insufficient documentation

## 2018-05-17 DIAGNOSIS — Z79899 Other long term (current) drug therapy: Secondary | ICD-10-CM | POA: Insufficient documentation

## 2018-05-17 DIAGNOSIS — Z91010 Allergy to peanuts: Secondary | ICD-10-CM | POA: Insufficient documentation

## 2018-05-17 DIAGNOSIS — Y939 Activity, unspecified: Secondary | ICD-10-CM | POA: Insufficient documentation

## 2018-05-17 DIAGNOSIS — F17210 Nicotine dependence, cigarettes, uncomplicated: Secondary | ICD-10-CM | POA: Insufficient documentation

## 2018-05-17 DIAGNOSIS — Y929 Unspecified place or not applicable: Secondary | ICD-10-CM | POA: Insufficient documentation

## 2018-05-17 DIAGNOSIS — Z91040 Latex allergy status: Secondary | ICD-10-CM | POA: Insufficient documentation
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
## 2018-05-17 NOTE — Discharge Instructions (Addendum)
Please read attached information. If you experience any new or worsening signs or symptoms please return to the emergency room for evaluation. Please follow-up with your primary care provider or specialist as discussed.  °

## 2018-05-21 ENCOUNTER — Ambulatory Visit: Payer: Self-pay | Admitting: Nurse Practitioner

## 2018-08-02 ENCOUNTER — Ambulatory Visit: Payer: Self-pay | Attending: Nurse Practitioner | Admitting: Nurse Practitioner

## 2018-08-02 ENCOUNTER — Encounter: Payer: Self-pay | Admitting: Nurse Practitioner

## 2018-08-02 VITALS — BP 121/78 | HR 50 | Temp 98.2°F | Ht 71.0 in | Wt 197.8 lb

## 2018-08-02 DIAGNOSIS — J4520 Mild intermittent asthma, uncomplicated: Secondary | ICD-10-CM

## 2018-08-02 DIAGNOSIS — R454 Irritability and anger: Secondary | ICD-10-CM

## 2018-08-02 DIAGNOSIS — L301 Dyshidrosis [pompholyx]: Secondary | ICD-10-CM

## 2018-08-02 DIAGNOSIS — J0100 Acute maxillary sinusitis, unspecified: Secondary | ICD-10-CM

## 2018-08-02 DIAGNOSIS — F913 Oppositional defiant disorder: Secondary | ICD-10-CM

## 2018-08-02 DIAGNOSIS — R42 Dizziness and giddiness: Secondary | ICD-10-CM

## 2018-08-02 DIAGNOSIS — R4689 Other symptoms and signs involving appearance and behavior: Secondary | ICD-10-CM

## 2018-08-02 MED ORDER — BETAMETHASONE VALERATE 0.1 % EX OINT: 1.0000 "application " | TOPICAL_OINTMENT | Freq: Two times a day (BID) | CUTANEOUS | 1 refills | Status: DC

## 2018-08-02 MED ORDER — ALBUTEROL SULFATE HFA 108 (90 BASE) MCG/ACT IN AERS: 2.0000 | INHALATION_SPRAY | Freq: Four times a day (QID) | RESPIRATORY_TRACT | 1 refills | Status: DC | PRN

## 2018-08-02 MED ORDER — AMOXICILLIN-POT CLAVULANATE 875-125 MG PO TABS
1.0000 | ORAL_TABLET | Freq: Two times a day (BID) | ORAL | 0 refills | Status: AC
Start: 2018-08-02 — End: 2018-08-07

## 2018-08-02 MED ORDER — DIVALPROEX SODIUM 500 MG PO DR TAB: 500.0000 mg | DELAYED_RELEASE_TABLET | Freq: Every day | ORAL | 1 refills | Status: DC

## 2018-08-02 MED ORDER — MECLIZINE HCL 25 MG PO TABS: 25.0000 mg | ORAL_TABLET | Freq: Three times a day (TID) | ORAL | 1 refills | Status: AC | PRN

## 2018-08-02 MED FILL — MECLIZINE 25 MG TABLET: 25 | 30 days supply | Qty: 60 | Fill #0

## 2018-08-02 MED FILL — !VENTOLIN HFA INHALER: 108 (90 BAS | 25 days supply | Qty: 18 | Fill #0

## 2018-08-02 MED FILL — BETAMETHASONE VALER 0.1% OI: 0.1 | 15 days supply | Qty: 30 | Fill #0

## 2018-08-02 MED FILL — DIVALPROEX SOD DR 500 MG TA: 500 | 30 days supply | Qty: 30 | Fill #0

## 2018-08-02 MED FILL — AMOX-CLAV 875-125 MG TABLET: 875-125 | 5 days supply | Qty: 10 | Fill #0

## 2018-08-02 NOTE — Patient Instructions (Addendum)
Behavioral Health Resources:   What if I or someone I know is in crisis?  . If you are thinking about harming yourself or having thoughts of suicide, or if you know someone who is, seek help right away.  . Call your doctor or mental health care provider.  . Call 911 or go to a hospital emergency room to get immediate help, or ask a friend or family member to help you do these things.  . Call the [place] National Suicide Prevention Lifeline's toll-free, 24-hour hotline at 1-800-273-TALK 714-029-8129) or TTY: 1-800-799-4 TTY 509-215-0395) to talk to a trained counselor.  . If you are in crisis, make sure you are not left alone.   . If someone else is in crisis, make sure he or she is not left alone   24 Hour Availability  [facility]  [address] [place], [place] [address]  [phone] or [phone]  [facility] [employer] (Domestic Violence, Rape & Victim Assistance [phone]  [facility]  [address], [place]  [address]               [phone] or [phone]  [place]    (ONLY from 8am-4pm)    [phone]  [facility] (24/7)   [phone]  [place] National Suicide Hotline   (364)045-1350 Diamantina Monks)  Support from local police to aid getting patient to hospital (http://www.[place].gov/index.aspx?page=2797)         ONGOING BEHAVIORAL HEALTH SUPPORT FOR UNINSURED and UNDERINSURED:  Beverly Sessions  [phone]   [facility] first time, Monday-Friday, 8:30am-5:00pm  *Bring snack, drink, something to do, long wait at first visit, they do have pharmacy for behavioral health medications/ Bring own interpreter at first visit, if needed  [employer] [place]  [place] Monday-Friday, 8:30am-12pm & 1-2:30pm  *pacientes que hablen espanol, favor comunicarse con el Sr.  [place], extension 2244 o [place], extension [place]:  [phone] or [email or web address]  [address]  Call or email, may self-refer  * uninsured/underinsured, [phone], have both mental health and substance use challenges   [facility]:  Phone [phone]; Fax [phone]  *Call to schedule an appointment  3rd Floor located @?[address], corner of [place]  Mon-Thursday: 8:30am-8:00pm Friday: 8:30am-7:00pm  * Be sure to park in a space labeled "Psychology Department," located to the right of the main door of the building. Enter the main doors facing the parking lot and take the elevator or stairs to the 3rd Floor.   [place]:  [phone] or  [phone] (24/hour helpline)  [address]  Call to make appointment, tends to be a long wait to begin services, depending on insurance    [facility]  [phone] ??  *Call to schedule an appointment   [address] [place], 8:00am-5:00pm            [place]  [phone] [address], [place]  Monday-Friday, walk-in 8am-3pm  First appointment is assessment, then will make appointment for psychiatry     Intermittent Explosive Disorder Intermittent explosive disorder (IED) is a mental health disorder in which a person has trouble controlling his or her angry impulses. A person with this disorder  may repeatedly have sudden episodes of aggression (explosive rages). The rages are unplanned and too much for the situation. They can result in serious injury to people or animals, or property damage. IED may interfere with relationships, school, or work, and may cause legal or financial problems. Intermittent explosive disorder most commonly starts in childhood or the teenage years. It may go away after 10-20 years or may last for life. People with IED often have one or  more other mental disorders, such as other impulse control disorders, depression, anxiety, or substance abuse. People with this disorder are also at higher risk for self-injury and suicide attempts. What are the causes? The cause of IED is not known. What increases the risk? You are more likely to develop this condition if:  You are male.  You were abused during childhood.  You have a family member who has IED, a different impulse control disorder, or other mental health issues. What are the signs or symptoms? Explosive rages may be verbal, physical, or both. Examples of verbal rages may include:  Temper tantrums after the age of 46 years.  Verbal arguments or fights.  Vicious name calling, cursing, or insulting others.  Road rage. Physical rages are directed at people, animals, or property. They may or may not cause injury or damage. Examples of physical aggression may include:  Pushing, slapping, punching, choking, or kicking.  Punching holes in walls, breaking furniture, or throwing items. Verbal or physical rages:  May happen an average of twice a week for 3 months or longer. Rages causing physical injury or property damage may occur at least three times a year.  Usually last less than 30 minutes. People with IED may want to change their behavior but are often unable to monitor and control their outbursts by themselves. You may feel remorseful or embarrassed after a rage. In between rages, there may be no signs of anger, or you may be irritable. Before or during a rage, you may have one or more of the following sensations:  Tingling.  Shaking (tremors).  Pounding heart.  Chest tightness.  Head pressure.  Hearing an echo. How is this diagnosed? Intermittent explosive disorder is diagnosed through an assessment by your health care provider. He or she may ask about:  Your rages and how they affect your life.  Your moods, thoughts, and other behaviors.  Your medical  history.  Your use of medicines or drugs. Your health care provider may do a physical exam and order lab tests or brain imaging tests. You may be evaluated by a mental health professional. How is this treated? This condition is treated by mental health specialists. Treatment may include:  Therapy. You may have therapy that: ? Focuses on your underlying feelings and reasons that lead to rages. ? Evaluates your triggers for rages and ways to prevent or control them (cognitive behavioral therapy, CBT). CBT in a group setting may be especially helpful.  Support groups. These can provide emotional support, advice, and guidance.  Medicine to help reduce the frequency and severity of explosive rages. There are several types of medicines that may be used, including certain antidepressants, mood stabilizers, anti-seizure medicines (anticonvulsants), major tranquilizers (antipsychotics), and beta blockers. The most effective treatment is usually a combination of therapy, support groups, and medicine. You will also be treated for any other mental health problems you have. Follow these instructions at home:   Take over-the-counter and prescription medicines only as told by your health care provider.  Do not start  taking any new over-the-counter or prescription medicines before first getting approval from your health care provider.  Attend therapy and support groups as recommended.  Try to avoid situations and people that upset you.  Keep all follow-up visits as told by your health care provider. This is important. Contact a health care provider if you:  Have worsening symptoms.  Are not able to take your medicine as told. Get help right away if you have:  Thoughts about hurting yourself or someone else.  Seriously injured yourself or someone else. If you ever feel like you may hurt yourself or others, or have thoughts about taking your own life, get help right away. You can go to your  nearest emergency department or call:  Your local emergency services (911 in the [place]  A suicide crisis helpline, such as the [place] at [phone]. This is open 24 hours a day. Summary  Intermittent explosive disorder (IED) is a mental health disorder in which a person has trouble controlling his or her angry impulses.  Intermittent explosive disorder most commonly starts in childhood or the teenage years.  People with IED may want to change their behavior but are often unable to monitor and control their outbursts by themselves.  The most effective treatment is usually a combination of therapy, support groups, and medicine. Treatment will also focus on any other mental health problems you have. This information is not intended to replace advice given to you by your health care provider. Make sure you discuss any questions you have with your health care provider. Document Released: 09/23/2007 Document Revised: 04/23/2017 Document Reviewed: 04/23/2017 Elsevier Interactive Patient Education  2019 [place] for Managing Your Anger How can anger affect me? Everyone feels angry from time to time. It is okay and normal to feel angry. However, the way that you behave or react to anger can make it a problem. If you react too strongly to anger or you cannot control your anger, that can cause relationships problems at home and work. Anger can also affect your health. Uncontrolled anger increases your risk of heart disease. When you are angry, your heart rate and blood pressure rise. Levels of certain energy hormones, such as adrenaline, also increase. When this happens, your heart has to work harder. In extreme cases, anger can cause the blood vessels to become narrow. This reduces the supply of blood and oxygen to the heart, and that can trigger chest pain (angina). Anger can also trigger stress-related problems, such as:  Headaches.  Poor  digestion.  Trouble sleeping (insomnia). What actions can be taken?     You can take actions to help you manage your anger. For example:  Express your anger. When you express your anger in a healthy way, it is a form of communication. The following strategies can help you to express your anger in a healthy way and when you are ready to do so: ? Step away. When you are feeling reactive, it may take at least 20 minutes for your body to return to its normal blood pressure and heart rate. To help your body do this, take a walk, listen to music, stretch, take deep breaths, and avoid the situation or person who is making you angry. Try to only discuss your anger when you feel calm again. ? Try to consider how others feel before you react. Avoid swearing, sighing, raising your voice, or blaming. ? Choose a good time to work through problems. You may be  more likely to lose your temper at the end of the day when you are tired. ? Keep an anger journal. Writing down the situations that make you angry can help you figure out what triggers your anger and why.  Consider changing your perception. Is there another way you can view the situation that will leave you with a different emotion? Sometimes, changing the way you think about a situation can make it seem less infuriating. Here are some ways to do that: ? Remind yourself that everyone is not out to get you. ? Remind yourself that a disappointing result is not the end of the world. ? Take steps to solve or prevent the situation that upsets you. ? Find the humor in an aggravating situation. ? Deal with the physical effects by taking deep breaths, exercising, or taking a walk. ? Slowly repeat the word "relax" or another calming phrase. ? Picture a relaxing image in your mind. Close your eyes and use that image to help you calm yourself. Why are these changes important? Anger becomes a problem if it occurs frequently and lasts for long periods of time. You  may also need help managing your anger if:  You use physical force or aggression when you are angry and others feel threatened and fearful.  You feel that your anger is out of control.  Anger is interfering with your job.  Anger is causing problems with your health.  Anger is causing problems with your relationships.  Anger is affecting your ability to tolerate normal daily situations, such as sitting in a traffic jam or waiting in line.  You treat others disrespectfully.  You do not trust people around you. It may help to ask someone you trust whether he or she thinks you show any of these signs. Sometimes, it can be hard to recognize the problem yourself. Where to find support  A psychologist or another licensed mental health professional can help you learn how to manage your anger. Ask your health care provider for a referral, or look online to find a psychologist who specializes in anger management. You can search the websites of many mental health organizations to find a mental health care provider. Local Domestic Abuse Projects are also available for help. Your local hospital or behavioral counselors in your area may also offer anger management programs or support groups that can help. Where to find more information  The U.[place]. Centers for Disease Control and Prevention: RentalRefinancing.at  American Psychological Association: WirelessPromos.com.cy  The Substance Abuse and Mental Health Services Administration: FuneralShow.uy  The [employer] on Domestic Violence: https://anderson-johnson.com/ This information is not intended to replace advice given to you by your health care provider. Make sure you discuss any questions you have with your health care provider. Document Released: 04/13/2007 Document Revised: 10/16/2016 Document Reviewed: 04/20/2015 Elsevier Interactive Patient Education   2019 [employer].

## 2018-08-02 NOTE — Progress Notes (Signed)
Assessment & Plan:  Adrian [name] was seen today for behavior problem.  Diagnoses and all orders for this visit:  Anger reaction -     divalproex (DEPAKOTE) 500 MG DR tablet; Take 1 tablet (500 mg total) by mouth daily for 30 days.  Oppositional defiant behavior -     divalproex (DEPAKOTE) 500 MG DR tablet; Take 1 tablet (500 mg total) by mouth daily for 30 days.  Acute non-recurrent maxillary sinusitis -     amoxicillin-clavulanate (AUGMENTIN) 875-125 MG tablet; Take 1 tablet by mouth 2 (two) times daily for 5 days.  Dizziness -     meclizine (ANTIVERT) 25 MG tablet; Take 1 tablet (25 mg total) by mouth 3 (three) times daily as needed for up to 30 days for dizziness.  Eczema, dyshidrotic -     betamethasone valerate ointment (VALISONE) 0.1 %; Apply 1 application topically 2 (two) times daily.  Asthma in adult, mild intermittent, uncomplicated -     albuterol (VENTOLIN HFA) 108 (90 Base) MCG/ACT inhaler; Inhale 2 puffs into the lungs every 6 (six) hours as needed for wheezing or shortness of breath (cough).    Patient has been counseled on age-appropriate routine health concerns for screening and prevention. These are reviewed and up-to-date. Referrals have been placed accordingly. Immunizations are up-to-date or declined.    Subjective:   Chief Complaint  Patient presents with  . Behavior Problem    Patient stated he would like to talk to his physcian regarding his overheating mood behavior.    HPI Adrian [name] 43 y.o. male presents to office today desiring to discuss his "I am getting overheated" behavioral health issues.  Depression Patient complains of anger management concerns. He complains of psychomotor agitation and Becoming overheated and having to walk away from situations that are agitating for him, headaches, difficulty focusing, frustration and irritation.Marland Kitchen  He states he has been dealing with these symptoms since childhood and was actually given Ritalin in the past.   He denies current suicidal and homicidal plan or intent. .Possible organic causes contributing are: Substance abuse/marijuana use.  Previous treatment includes None.  He gets angry at work and at home. States his son who is in his 63s was diagnosed with Bipolar Disorder however he is unable to recall any medications that he is taking but does state he does not take the medications he has been prescribed.  Due to PHQ 9 score today and based on patient's current reported symptoms will start on low-dose Depakote and he is also been instructed to follow-up with an outpatient behavioral health facility for ongoing psychotherapy. Depression screen [facility] 2/9 08/02/2018 12/16/2017 09/25/2017 08/15/2016 11/15/2015  Decreased Interest 3 2 3 3  0  Down, Depressed, Hopeless 3 2 2 2  0  PHQ - 2 Score 6 4 5 5  0  Altered sleeping 3 2 3 3 1   Tired, decreased energy 3 2 0 2 1  Change in appetite - 2 1 1  0  Feeling bad or failure about yourself  3 2 1 1  0  Trouble concentrating 3 2 0 2 0  Moving slowly or fidgety/restless 2 1 0 1 0  Suicidal thoughts 2 2 2 1  0  PHQ-9 Score 22 17 12 16 2     Vertigo - Dizziness: Patient presents with complaints of chronic and intermittent dizziness .  Dizziness has been ongoing for several years.  Unfortunately due to insurance issues he has not been able to be evaluated by ENT or neurology.  There is a head  CT that was performed 08/12/2014 for dizziness with no acute findings.  The patient describes the symptoms as lightheadedness. Symptoms are exacerbated by none identified The patient also complains of Sinus congestion and headaches. Patient denies aural pressure otalgia otorrhea tinnitus hearing loss.  He has been treated with meclizine (Antivert) with good improvement.    Review of Systems  Constitutional: Negative for fever, malaise/fatigue and weight loss.  HENT: Negative.  Negative for nosebleeds.   Eyes: Negative.  Negative for blurred vision, double vision and photophobia.    Respiratory: Positive for shortness of breath (Mild and intermittent). Negative for cough.   Cardiovascular: Negative.  Negative for chest pain, palpitations and leg swelling.  Gastrointestinal: Negative.  Negative for heartburn, nausea and vomiting.  Musculoskeletal: Negative.  Negative for myalgias.  Skin: Positive for itching and rash.       History of dyshidrotic eczema  Neurological: Positive for dizziness and headaches. Negative for sensory change, speech change, focal weakness, seizures and weakness.  Psychiatric/Behavioral: Positive for depression. Negative for suicidal ideas. The patient is nervous/anxious.        See HPI    Past Medical History:  Diagnosis Date  . 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 Eczema   . Environmental allergies    grass  . Foot fracture, left   . Heart murmur   . Migraine   . Multiple food allergies    fish, tomatoes, peanuts, eggs, chicken and others    Past Surgical History:  Procedure Laterality Date  . INGUINAL HERNIA REPAIR  1994    Family History  Problem Relation Age of Onset  . Asthma Father   . Diabetes Mother   . Hypertension Mother   . Clotting disorder Other        Aunt  . Breast cancer Other        Grandmother  . Prostate cancer Other        uncle  . Hyperlipidemia Other        grandparent  . Hypertension Other        grandparent/grandparent  . Kidney disease Other        aunt/uncle/grandparent  . Stroke Other        grandparent    Social History Reviewed with no changes to be made today.   Outpatient Medications Prior to Visit  Medication Sig Dispense Refill  . albuterol (VENTOLIN HFA) 108 (90 Base) MCG/ACT inhaler Inhale 2 puffs into the lungs every 6 (six) hours as needed for wheezing or shortness of breath (cough). 18 g 1  . fluticasone (FLOVENT HFA) 44 MCG/ACT inhaler Inhale 2 puffs into the lungs 2 (two) times daily. 1 Inhaler 12  . triamcinolone ointment (KENALOG) 0.5 % Apply topically 2 (two) times daily. 80 g 3  .  acetaminophen (TYLENOL) 500 MG tablet Take 500-1,000 mg by mouth every 6 (six) hours as needed (for pain).    . calcium carbonate (TUMS - DOSED IN MG ELEMENTAL CALCIUM) 500 MG chewable tablet Chew 1-2 tablets by mouth as needed for indigestion or heartburn.    . cetirizine (ZYRTEC) 10 MG tablet Take 1 tablet (10 mg total) by mouth daily. (Patient not taking: Reported on 08/02/2018) 30 tablet 11  . fluticasone (FLONASE) 50 MCG/ACT nasal spray Place 2 sprays into both nostrils daily. (Patient not taking: Reported on 08/02/2018) 16 g 6  . betamethasone valerate ointment (VALISONE) 0.1 % Apply 1 application topically 2 (two) times daily. (Patient not taking: Reported on 08/02/2018) 30 g 1  .  predniSONE (DELTASONE) 50 MG tablet Take one tablet by mouth daily for 5 days. (Patient not taking: Reported on 04/12/2018) 5 tablet 0   No facility-administered medications prior to visit.     Allergies  Allergen Reactions  . Apple Shortness Of Breath, Itching, Nausea Only and Swelling    Tongue itches also  . Atrovent [Ipratropium] Anaphylaxis    Atrovent contains peanut oil and PT has a HX of anaphlaxis to peanuts  . Banana Shortness Of Breath and Itching    Makes tongue itch  . Carrot Oil Shortness Of Breath and Itching  . Fish Allergy Anaphylaxis, Hives and Swelling  . Fruit & Vegetable Daily [Nutritional Supplements] Shortness Of Breath and Itching    Patient stated that he allergic to most all fruits  . Peanut-Containing Drug Products Anaphylaxis, Hives, Nausea And Vomiting and Swelling  . Shellfish Allergy Anaphylaxis, Hives, Swelling and Other (See Comments)    "ALL SEAFOOD"  . Tomato Anaphylaxis, Hives and Swelling  . Chicken Meat (Diagnostic) Other (See Comments)    Tested allergic to this years ago  . Latex Itching    No powdered gloves!!  . Pork-Derived Products Other (See Comments)    CAUSES SEVERE HEADACHES  . Soy Allergy Other (See Comments)    Tested allergic to this years ago  . Grass  Extracts [Gramineae Pollens] Hives, Rash and Other (See Comments)    Wheezing also  . Tree Extract Other (See Comments)    Patient tested allergic to this years ago       Objective:    BP 121/78   Pulse (!) 50   Temp 98.2 F (36.8 C) (Oral)   Ht 5\' 11"  (1.803 m)   Wt 197 lb 12.8 oz (89.7 kg)   SpO2 99%   BMI 27.59 kg/m  Wt Readings from Last 3 Encounters:  08/02/18 197 lb 12.8 oz (89.7 kg)  05/17/18 210 lb (95.3 kg)  04/12/18 198 lb 12.8 oz (90.2 kg)    Physical Exam Vitals signs and nursing note reviewed.  Constitutional:      Appearance: He is well-developed.  HENT:     Head: Normocephalic and atraumatic.     Right Ear: Hearing, tympanic membrane, ear canal and external ear normal.     Left Ear: Hearing, tympanic membrane, ear canal and external ear normal.     Nose:     Right Turbinates: Swollen (Erythematous with purulent drainage in nasal vault).     Left Turbinates: Swollen (Erythematous).  Neck:     Musculoskeletal: Normal range of motion.  Cardiovascular:     Rate and Rhythm: Regular rhythm. Bradycardia present.     Heart sounds: Normal heart sounds. No murmur. No friction rub. No gallop.   Pulmonary:     Effort: Pulmonary effort is normal. No tachypnea or respiratory distress.     Breath sounds: Normal breath sounds. No decreased breath sounds, wheezing, rhonchi or rales.  Chest:     Chest wall: No tenderness.  Abdominal:     General: Bowel sounds are normal.     Palpations: Abdomen is soft.  Musculoskeletal: Normal range of motion.  Skin:    General: Skin is warm and dry.     Comments: Dyshidrotic eczema of bilateral hands.  Both hands are dry, cracked with no visible signs of infection and skin is very rough to touch  Neurological:     Mental Status: He is alert and oriented to person, place, and time.     Coordination: Coordination  normal.  Psychiatric:        Behavior: Behavior normal. Behavior is cooperative.        Thought Content: Thought  content normal.        Judgment: Judgment normal.          Patient has been counseled extensively about nutrition and exercise as well as the importance of adherence with medications and regular follow-up. The patient was given clear instructions to go to ER or return to medical center if symptoms don't improve, worsen or new problems develop. The patient verbalized understanding.   Follow-up: Return in about 4 weeks (around 08/30/2018) for behavior disorder/sinusitis/dizziness.   Gildardo Pounds, FNP-BC [facility] [place], [place]   08/02/2018, 12:16 PM

## 2018-08-04 MED FILL — TRIAMCINOLONE 0.5% OINTMENT: 0.5 | 15 days supply | Qty: 30 | Fill #2

## 2018-09-09 ENCOUNTER — Other Ambulatory Visit: Payer: Self-pay

## 2018-09-09 ENCOUNTER — Emergency Department (HOSPITAL_COMMUNITY)
Admission: EM | Admit: 2018-09-09 | Discharge: 2018-09-09 | Disposition: A | Discharge status: Home / Self Care | Payer: Self-pay

## 2018-09-09 ENCOUNTER — Encounter (HOSPITAL_COMMUNITY): Payer: Self-pay | Admitting: Emergency Medicine

## 2018-09-09 ENCOUNTER — Encounter: Payer: Self-pay | Admitting: Nurse Practitioner
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
## 2018-11-06 ENCOUNTER — Encounter (HOSPITAL_COMMUNITY): Payer: Self-pay

## 2018-11-06 ENCOUNTER — Emergency Department (HOSPITAL_COMMUNITY)
Admission: EM | Admit: 2018-11-06 | Discharge: 2018-11-06 | Disposition: A | Discharge status: Home / Self Care | Payer: Self-pay | Attending: Emergency Medicine | Admitting: Emergency Medicine

## 2018-11-06 ENCOUNTER — Other Ambulatory Visit: Payer: Self-pay

## 2018-11-06 DIAGNOSIS — Z91010 Allergy to peanuts: Secondary | ICD-10-CM | POA: Insufficient documentation

## 2018-11-06 DIAGNOSIS — Z91040 Latex allergy status: Secondary | ICD-10-CM | POA: Insufficient documentation

## 2018-11-06 DIAGNOSIS — J45909 Unspecified asthma, uncomplicated: Secondary | ICD-10-CM | POA: Insufficient documentation

## 2018-11-06 DIAGNOSIS — R42 Dizziness and giddiness: Secondary | ICD-10-CM | POA: Insufficient documentation

## 2018-11-06 LAB — BASIC METABOLIC PANEL
Anion gap: 6 (ref 5–15)
BUN: 8 mg/dL (ref 6–20)
CO2: 27 mmol/L (ref 22–32)
Calcium: 8.8 mg/dL — ABNORMAL LOW (ref 8.9–10.3)
Chloride: 105 mmol/L (ref 98–111)
Creatinine, Ser: 1.12 mg/dL (ref 0.61–1.24)
GFR calc Af Amer: >60 mL/min (ref >60)
GFR calc non Af Amer: >60 mL/min (ref >60)
Glucose, Bld: 132 mg/dL — ABNORMAL HIGH (ref 70–99)
Potassium: 3.7 mmol/L (ref 3.5–5.1)
Sodium: 138 mmol/L (ref 135–145)

## 2018-11-06 LAB — CBC WITH DIFFERENTIAL/PLATELET
Abs Immature Granulocytes: 0.02 10*3/uL (ref 0.00–0.07)
Basophils Absolute: 0 10*3/uL (ref 0.0–0.1)
Basophils Relative: 0 %
Eosinophils Absolute: 0.2 10*3/uL (ref 0.0–0.5)
Eosinophils Relative: 3 %
HCT: 41.3 % (ref 39.0–52.0)
Hemoglobin: 12.9 g/dL — ABNORMAL LOW (ref 13.0–17.0)
Immature Granulocytes: 0 %
Lymphocytes Relative: 26 %
Lymphs Abs: 1.9 10*3/uL (ref 0.7–4.0)
MCH: 26 pg (ref 26.0–34.0)
MCHC: 31.2 g/dL (ref 30.0–36.0)
MCV: 83.3 fL (ref 80.0–100.0)
Monocytes Absolute: 0.4 10*3/uL (ref 0.1–1.0)
Monocytes Relative: 5 %
Neutro Abs: 4.7 10*3/uL (ref 1.7–7.7)
Neutrophils Relative %: 66 %
Platelets: 207 10*3/uL (ref 150–400)
RBC: 4.96 MIL/uL (ref 4.22–5.81)
RDW: 13.8 % (ref 11.5–15.5)
WBC: 7.3 10*3/uL (ref 4.0–10.5)
nRBC: 0 % (ref 0.0–0.2)

## 2018-11-06 MED ORDER — MECLIZINE HCL 12.5 MG PO TABS: 12.5000 mg | ORAL_TABLET | Freq: Three times a day (TID) | ORAL | 0 refills | Status: AC | PRN

## 2018-11-06 MED ORDER — MECLIZINE HCL 25 MG PO TABS
25.0000 mg | ORAL_TABLET | Freq: Once | ORAL | Status: AC
  Administered 2018-11-06: 15:00:00 25 mg via ORAL
  Filled 2018-11-06: qty 1

## 2018-11-06 NOTE — ED Triage Notes (Signed)
C/op dizziness since yesterday while working yesterday.  Continuing through today.  Hx of previous episodes and was diagnosed with anxiety,

## 2018-11-06 NOTE — ED Triage Notes (Signed)
Pt in with dizziness on and off since yesterday. Denies any unilateral weakness or other symptoms.

## 2018-11-06 NOTE — ED Notes (Addendum)
Pt felt dizzy laying, sitting and standing.
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
## 2018-11-06 NOTE — Discharge Instructions (Addendum)
Your blood work today is normal.  Your heart tests today (orthostatic vital signs and EKG) are normal. Take Antivert as needed as prescribed for dizziness and recheck with your doctor. Return to the ER for new or worsening symptoms.

## 2018-11-06 NOTE — ED Provider Notes (Signed)
[facility] DEPARTMENT Provider Note   CSN: [id number] Arrival date & time: 11/06/18  1357    History   Chief Complaint Chief Complaint  Patient presents with  . Dizziness    HPI Nathanyl Andujo is a 43 y.o. male.     43 year old male with past medical history of asthma, migraines, heart murmur presents with complaint of dizziness.  Patient states that he felt dizzy at work today and requested to leave work because he felt unsteady on his feet.  Patient was advised he would need to come to the ER and be seen by his employer.  Patient states has had similar episodes previously, states episodes last about 4 days, described as feeling like he is on a roller coaster, worse with turning his head side to side or changes in position.  Patient denies nausea, vomiting, syncope.  Denies recent colds or illnesses.  No other complaints or concerns.     Past Medical History:  Diagnosis Date  . 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 Eczema   . Environmental allergies    grass  . Foot fracture, left   . Heart murmur   . Migraine   . Multiple food allergies    fish, tomatoes, peanuts, eggs, chicken and others    Patient Active Problem List   Diagnosis Date Noted  . Chronic migraine 09/13/2015  . Allergic rhinitis 04/09/2015  . Poor vision 04/09/2015  . Bilateral wrist pain 12/15/2014  . Pain, dental 05/19/2012  . Lipoma 01/25/2012  . Asthma in adult 01/13/2012  . Eczema, dyshidrotic 01/13/2012    Past Surgical History:  Procedure Laterality Date  . INGUINAL HERNIA REPAIR  1994        Home Medications    Prior to Admission medications   Medication Sig Start Date End Date Taking? Authorizing Provider  albuterol (VENTOLIN HFA) 108 (90 Base) MCG/ACT inhaler Inhale 2 puffs into the lungs every 6 (six) hours as needed for wheezing or shortness of breath (cough). 08/02/18  Yes Gildardo Pounds, NP  calcium carbonate (TUMS - DOSED IN MG ELEMENTAL CALCIUM) 500 MG  chewable tablet Chew 1-2 tablets by mouth as needed for indigestion or heartburn.   Yes [provider]  acetaminophen (TYLENOL) 500 MG tablet Take 500-1,000 mg by mouth every 6 (six) hours as needed (for pain).    [provider]  betamethasone valerate ointment (VALISONE) 0.1 % Apply 1 application topically 2 (two) times daily. Patient not taking: Reported on 11/06/2018 08/02/18   Gildardo Pounds, NP  cetirizine (ZYRTEC) 10 MG tablet Take 1 tablet (10 mg total) by mouth daily. Patient not taking: Reported on 11/06/2018 09/25/17   Gildardo Pounds, NP  divalproex (DEPAKOTE) 500 MG DR tablet Take 1 tablet (500 mg total) by mouth daily for 30 days. Patient not taking: Reported on 11/06/2018 08/02/18 11/06/18  Gildardo Pounds, NP  fluticasone [facility]) 50 MCG/ACT nasal spray Place 2 sprays into both nostrils daily. Patient not taking: Reported on 08/02/2018 09/25/17   Gildardo Pounds, NP  meclizine (ANTIVERT) 12.5 MG tablet Take 1 tablet (12.5 mg total) by mouth 3 (three) times daily as needed for up to 5 days for dizziness. 11/06/18 11/11/18  Tacy Learn, PA-C    Family History Family History  Problem Relation Age of Onset  . Asthma Father   . Diabetes Mother   . Hypertension Mother   . Clotting disorder Other        Aunt  .  Breast cancer Other        Grandmother  . Prostate cancer Other        uncle  . Hyperlipidemia Other        grandparent  . Hypertension Other        grandparent/grandparent  . Kidney disease Other        aunt/uncle/grandparent  . Stroke Other        grandparent    Social History Social History   Tobacco Use  . Smoking status: Never Smoker  . Smokeless tobacco: Never Used  Substance Use Topics  . Alcohol use: Never    Frequency: Never  . Drug use: Never    Types: Marijuana     Allergies   Apple; Atrovent [ipratropium]; Banana; Carrot oil; Fish allergy; Fruit & vegetable daily [nutritional supplements]; Peanut-containing drug products;  Shellfish allergy; Tomato; Chicken meat (diagnostic); Latex; Pork-derived products; Soy allergy; Grass extracts [gramineae pollens]; and Tree extract   Review of Systems Review of Systems  Constitutional: Negative for fever.  Eyes: Negative for visual disturbance.  Respiratory: Negative for shortness of breath.   Cardiovascular: Negative for chest pain.  Gastrointestinal: Negative for nausea and vomiting.  Musculoskeletal: Positive for gait problem.  Skin: Negative for rash and wound.  Allergic/Immunologic: Negative for immunocompromised state.  Neurological: Positive for dizziness. Negative for speech difficulty, weakness and headaches.  Psychiatric/Behavioral: Negative for confusion.  All other systems reviewed and are negative.    Physical Exam Updated Vital Signs BP (!) 169/97 (BP Location: Right Arm)   Pulse (!) 52   Temp 99 F (37.2 C) (Oral)   Ht 5\' 11"  (1.803 m)   Wt 90.7 kg   SpO2 100%   BMI 27.89 kg/m   Physical Exam Vitals signs and nursing note reviewed.  Constitutional:      General: He is not in acute distress.    Appearance: He is well-developed. He is not diaphoretic.  HENT:     Head: Normocephalic and atraumatic.     Right Ear: Tympanic membrane and ear canal normal.     Left Ear: Tympanic membrane and ear canal normal.     Mouth/Throat:     Mouth: Mucous membranes are moist.     Pharynx: No oropharyngeal exudate or posterior oropharyngeal erythema.  Eyes:     Extraocular Movements: Extraocular movements intact.     Pupils: Pupils are equal, round, and reactive to light.  Cardiovascular:     Rate and Rhythm: Regular rhythm. Bradycardia present.     Pulses: Normal pulses.     Heart sounds: Normal heart sounds. No murmur.  Pulmonary:     Effort: Pulmonary effort is normal.     Breath sounds: Normal breath sounds.  Abdominal:     Tenderness: There is no abdominal tenderness.  Skin:    General: Skin is warm and dry.     Findings: No erythema or  rash.  Neurological:     General: No focal deficit present.     Mental Status: He is alert and oriented to person, place, and time.     GCS: GCS eye subscore is 4. GCS verbal subscore is 5. GCS motor subscore is 6.     Cranial Nerves: Cranial nerves are intact. No cranial nerve deficit.     Sensory: Sensation is intact. No sensory deficit.     Motor: No weakness.     Coordination: Romberg sign negative. Coordination normal. Rapid alternating movements normal.     Gait: Gait is intact.  Gait normal.     Deep Tendon Reflexes: Reflexes normal.  Psychiatric:        Mood and Affect: Mood normal.        Behavior: Behavior normal.      ED Treatments / Results  Labs (all labs ordered are listed, but only abnormal results are displayed) Labs Reviewed  BASIC METABOLIC PANEL - Abnormal; Notable for the following components:      Result Value   Glucose, Bld 132 (*)    Calcium 8.8 (*)    All other components within normal limits  CBC WITH DIFFERENTIAL/PLATELET - Abnormal; Notable for the following components:   Hemoglobin 12.9 (*)    All other components within normal limits    EKG EKG Interpretation  Date/Time:  Saturday Nov 06 2018 14:11:14 EDT Ventricular Rate:  45 PR Interval:    QRS Duration: 104 QT Interval:  444 QTC Calculation: 385 R Axis:   80 Text Interpretation:  Sinus bradycardia Confirmed by Gerlene Fee [phone]) on 11/06/2018 3:35:00 PM   Radiology No results found.  Procedures Procedures (including critical care time)  Medications Ordered in ED Medications  meclizine (ANTIVERT) tablet 25 mg (25 mg Oral Given 11/06/18 1436)     Initial Impression / Assessment and Plan / ED Course  I have reviewed the triage vital signs and the nursing notes.  Pertinent labs & imaging results that were available during my care of the patient were reviewed by me and considered in my medical decision making (see chart for details).  Clinical Course as of Nov 06 1542  Sat Nov 06, 2018  1541 43yo male with dizziness, worse with changes in position, states he feels like his equilibrium is off, recurrent symptoms, no new symptoms with this episode. Exam is unremarkable, symptoms reproduced with turing head side to side. CBC and BMP unremarkable, EKG with sinus bradycardia, unchanged. Review or prior records shows normal heart rate in the 40-60s. Patient given Antivert with improvement in symptoms, given rx for same, advised patient to follow up with PCP on Monday, return to ER for new or worsening symptoms.    [LM]    Clinical Course User Index [LM] Tacy Learn, PA-C      Final Clinical Impressions(s) / ED Diagnoses   Final diagnoses:  Vertigo    ED Discharge Orders         Ordered    meclizine (ANTIVERT) 12.5 MG tablet  3 times daily PRN     11/06/18 1535           Tacy Learn, PA-C 11/06/18 1544    Elnora Morrison, MD 11/06/18 1558

## 2018-11-30 ENCOUNTER — Other Ambulatory Visit: Payer: Self-pay | Admitting: Nurse Practitioner

## 2018-11-30 DIAGNOSIS — L309 Dermatitis, unspecified: Secondary | ICD-10-CM

## 2018-12-03 ENCOUNTER — Encounter: Payer: Self-pay | Admitting: Nurse Practitioner

## 2018-12-03 ENCOUNTER — Other Ambulatory Visit: Payer: Self-pay

## 2018-12-03 ENCOUNTER — Ambulatory Visit: Payer: Self-pay | Attending: Nurse Practitioner | Admitting: Nurse Practitioner

## 2018-12-03 DIAGNOSIS — J4520 Mild intermittent asthma, uncomplicated: Secondary | ICD-10-CM

## 2018-12-03 DIAGNOSIS — L301 Dyshidrosis [pompholyx]: Secondary | ICD-10-CM

## 2018-12-03 MED ORDER — HYDROXYZINE HCL 25 MG PO TABS: 25.0000 mg | ORAL_TABLET | Freq: Three times a day (TID) | ORAL | 0 refills | Status: DC | PRN

## 2018-12-03 MED ORDER — CETIRIZINE HCL 10 MG PO TABS: 10.0000 mg | ORAL_TABLET | Freq: Every day | ORAL | 11 refills | Status: DC

## 2018-12-03 MED ORDER — FLUTICASONE PROPIONATE HFA 44 MCG/ACT IN AERO: 2.0000 | INHALATION_SPRAY | Freq: Two times a day (BID) | RESPIRATORY_TRACT | 12 refills | Status: DC

## 2018-12-03 MED ORDER — BETAMETHASONE VALERATE 0.1 % EX OINT: 1.0000 "application " | TOPICAL_OINTMENT | Freq: Two times a day (BID) | CUTANEOUS | 1 refills | Status: DC

## 2018-12-03 MED ORDER — ALBUTEROL SULFATE HFA 108 (90 BASE) MCG/ACT IN AERS: 2.0000 | INHALATION_SPRAY | Freq: Four times a day (QID) | RESPIRATORY_TRACT | 1 refills | Status: DC | PRN

## 2018-12-03 MED FILL — BETAMETHASONE VALER 0.1% OI: 0.1 | 15 days supply | Qty: 30 | Fill #0

## 2018-12-03 MED FILL — !VENTOLIN HFA INHALER: 108 (90 BAS | 25 days supply | Qty: 18 | Fill #0

## 2018-12-03 MED FILL — hydrOXYzine HCL 25 MG TABS: 25 | 20 days supply | Qty: 60 | Fill #0
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
## 2018-12-06 MED FILL — !FLOVENT HFA 44 MCG INHALER: 44 MCG | 30 days supply | Qty: 1 | Fill #0

## 2018-12-20 MED FILL — BETAMETHASONE VALER 0.1% OI: 0.1 | 15 days supply | Qty: 30 | Fill #1

## 2019-01-26 ENCOUNTER — Other Ambulatory Visit: Payer: Self-pay | Admitting: Nurse Practitioner

## 2019-01-26 DIAGNOSIS — L309 Dermatitis, unspecified: Secondary | ICD-10-CM

## 2019-01-27 ENCOUNTER — Other Ambulatory Visit: Payer: Self-pay | Admitting: Nurse Practitioner

## 2019-01-27 DIAGNOSIS — L309 Dermatitis, unspecified: Secondary | ICD-10-CM

## 2019-02-08 ENCOUNTER — Encounter (HOSPITAL_COMMUNITY): Payer: Self-pay | Admitting: Emergency Medicine

## 2019-02-08 ENCOUNTER — Emergency Department (HOSPITAL_COMMUNITY)
Admission: EM | Admit: 2019-02-08 | Discharge: 2019-02-08 | Disposition: A | Discharge status: Home / Self Care | Payer: Self-pay | Attending: Emergency Medicine | Admitting: Emergency Medicine

## 2019-02-08 ENCOUNTER — Other Ambulatory Visit: Payer: Self-pay

## 2019-02-08 DIAGNOSIS — E162 Hypoglycemia, unspecified: Secondary | ICD-10-CM | POA: Insufficient documentation

## 2019-02-08 DIAGNOSIS — R42 Dizziness and giddiness: Secondary | ICD-10-CM | POA: Insufficient documentation

## 2019-02-08 DIAGNOSIS — F1290 Cannabis use, unspecified, uncomplicated: Secondary | ICD-10-CM | POA: Insufficient documentation

## 2019-02-08 DIAGNOSIS — R7989 Other specified abnormal findings of blood chemistry: Secondary | ICD-10-CM | POA: Insufficient documentation

## 2019-02-08 DIAGNOSIS — Z91040 Latex allergy status: Secondary | ICD-10-CM | POA: Insufficient documentation

## 2019-02-08 LAB — URINALYSIS, ROUTINE W REFLEX MICROSCOPIC
Bacteria, UA: NONE SEEN
Bilirubin Urine: NEGATIVE
Glucose, UA: NEGATIVE mg/dL
Ketones, ur: NEGATIVE mg/dL
Leukocytes,Ua: NEGATIVE
Nitrite: NEGATIVE
Protein, ur: NEGATIVE mg/dL
Specific Gravity, Urine: 1.011 (ref 1.005–1.030)
pH: 6 (ref 5.0–8.0)

## 2019-02-08 LAB — RAPID URINE DRUG SCREEN, HOSP PERFORMED
Amphetamines: NOT DETECTED
Barbiturates: NOT DETECTED
Benzodiazepines: NOT DETECTED
Cocaine: NOT DETECTED
Opiates: NOT DETECTED
Tetrahydrocannabinol: POSITIVE — AB

## 2019-02-08 LAB — BASIC METABOLIC PANEL
Anion gap: 7 (ref 5–15)
BUN: 10 mg/dL (ref 6–20)
CO2: 30 mmol/L (ref 22–32)
Calcium: 9.2 mg/dL (ref 8.9–10.3)
Chloride: 103 mmol/L (ref 98–111)
Creatinine, Ser: 1.25 mg/dL — ABNORMAL HIGH (ref 0.61–1.24)
GFR calc Af Amer: >60 mL/min (ref >60)
GFR calc non Af Amer: >60 mL/min (ref >60)
Glucose, Bld: 60 mg/dL — ABNORMAL LOW (ref 70–99)
Potassium: 3.9 mmol/L (ref 3.5–5.1)
Sodium: 140 mmol/L (ref 135–145)

## 2019-02-08 LAB — CBC WITH DIFFERENTIAL/PLATELET
Abs Immature Granulocytes: 0.02 10*3/uL (ref 0.00–0.07)
Basophils Absolute: 0 10*3/uL (ref 0.0–0.1)
Basophils Relative: 0 %
Eosinophils Absolute: 0 10*3/uL (ref 0.0–0.5)
Eosinophils Relative: 0 %
HCT: 44.1 % (ref 39.0–52.0)
Hemoglobin: 13.8 g/dL (ref 13.0–17.0)
Immature Granulocytes: 0 %
Lymphocytes Relative: 22 %
Lymphs Abs: 1.6 10*3/uL (ref 0.7–4.0)
MCH: 26.7 pg (ref 26.0–34.0)
MCHC: 31.3 g/dL (ref 30.0–36.0)
MCV: 85.5 fL (ref 80.0–100.0)
Monocytes Absolute: 0.6 10*3/uL (ref 0.1–1.0)
Monocytes Relative: 8 %
Neutro Abs: 5.1 10*3/uL (ref 1.7–7.7)
Neutrophils Relative %: 70 %
Platelets: 274 10*3/uL (ref 150–400)
RBC: 5.16 MIL/uL (ref 4.22–5.81)
RDW: 15.1 % (ref 11.5–15.5)
WBC: 7.4 10*3/uL (ref 4.0–10.5)
nRBC: 0 % (ref 0.0–0.2)

## 2019-02-08 LAB — CBG MONITORING, ED
Glucose-Capillary: 63 mg/dL — ABNORMAL LOW (ref 70–99)
Glucose-Capillary: 133 mg/dL — ABNORMAL HIGH (ref 70–99)

## 2019-02-08 MED ORDER — SODIUM CHLORIDE 0.9 % IV BOLUS
1000.0000 mL | Freq: Once | INTRAVENOUS | Status: AC
  Administered 2019-02-08: 13:00:00 1000 mL via INTRAVENOUS

## 2019-02-08 MED ORDER — MECLIZINE HCL 25 MG PO TABS: 25.0000 mg | ORAL_TABLET | Freq: Three times a day (TID) | ORAL | 0 refills | Status: DC | PRN

## 2019-02-08 MED ORDER — MECLIZINE HCL 25 MG PO TABS
25.0000 mg | ORAL_TABLET | Freq: Once | ORAL | Status: AC
  Administered 2019-02-08: 13:00:00 25 mg via ORAL
  Filled 2019-02-08: qty 1

## 2019-02-08 NOTE — Discharge Instructions (Signed)
You were seen in the ED today for dizziness Your EKG was reassuring Your labwork did show a mild elevate in your kidney function (creatinine level) as well as a low glucose level. We have treated both with fluids and food/drink.  We also gave you medication in the ED to help with your vertigo.  I have represcribed this medication for you. It appears you were not taking an adequate dose. Please take as prescribed for your dizziness.  Please increase your fluid intake for the next week and have your kidney function rechecked with your PCP.

## 2019-02-08 NOTE — ED Provider Notes (Signed)
[facility] EMERGENCY DEPARTMENT Provider Note   CSN: [id number] Arrival date & time: 02/08/19  1003    History   Chief Complaint Chief Complaint  Patient presents with  . Dizziness    HPI Adrian [name] is a 43 y.o. male with PMHx asthma, eczema, heart murmur, and vertigo who presents to the ED today complaining of intermittent dizziness x several years, worse recently. Pt reports he has been seen by both his PCP and at the ED for same, given meclizine but pt states it is not working for him. It is difficult to obtain an adequate history with patient - he initially states he is taking the medication as prescribed but then reports he cut them in half so they would last longer. Unsure if patient is taking full dose as prescribed or half doses. He reports that on Sunday he had an episode of questionable LOC. Pt states he felt the room was spinning and then "blacked out." He states he was able to catch himself on the sink. No head injury. When asked if he truly passed out pt states "yes a little bit." He has not followed back up with his PCP regarding his ongoing dizziness. Pt does endorse marijuana usage as well. No alcohol use. No cigarettes. Denies fever, chills, vision changes, headache, unilateral weakness or numbness, nausea, vomiting, or any other associated symptoms.        Past Medical History:  Diagnosis Date  . 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 Eczema   . Environmental allergies    grass  . Foot fracture, left   . Heart murmur   . Migraine   . Multiple food allergies    fish, tomatoes, peanuts, eggs, chicken and others    Patient Active Problem List   Diagnosis Date Noted  . Chronic migraine 09/13/2015  . Allergic rhinitis 04/09/2015  . Poor vision 04/09/2015  . Bilateral wrist pain 12/15/2014  . Pain, dental 05/19/2012  . Lipoma 01/25/2012  . Asthma in adult 01/13/2012  . Eczema, dyshidrotic 01/13/2012    Past Surgical History:  Procedure Laterality  Date  . INGUINAL HERNIA REPAIR  1994        Home Medications    Prior to Admission medications   Medication Sig Start Date End Date Taking? Authorizing Provider  albuterol (VENTOLIN HFA) 108 (90 Base) MCG/ACT inhaler Inhale 2 puffs into the lungs every 6 (six) hours as needed for wheezing or shortness of breath (cough). 12/03/18  Yes Gildardo Pounds, NP  fluticasone (FLOVENT HFA) 44 MCG/ACT inhaler Inhale 2 puffs into the lungs 2 (two) times a day for 30 days. 12/03/18 02/08/19 Yes Gildardo Pounds, NP  hydrOXYzine (ATARAX/VISTARIL) 25 MG tablet Take 1 tablet (25 mg total) by mouth 3 (three) times daily as needed. 12/03/18  Yes Gildardo Pounds, NP  cetirizine (ZYRTEC) 10 MG tablet Take 1 tablet (10 mg total) by mouth daily. Patient not taking: Reported on 02/08/2019 12/03/18   Gildardo Pounds, NP  divalproex (DEPAKOTE) 500 MG DR tablet Take 1 tablet (500 mg total) by mouth daily for 30 days. Patient not taking: Reported on 11/06/2018 08/02/18 11/06/18  Gildardo Pounds, NP  meclizine (ANTIVERT) 25 MG tablet Take 1 tablet (25 mg total) by mouth 3 (three) times daily as needed for dizziness. 02/08/19   Eustaquio Maize, PA-C    Family History Family History  Problem Relation Age of Onset  . Asthma Father   . Diabetes Mother   .  Hypertension Mother   . Clotting disorder Other        Aunt  . Breast cancer Other        Grandmother  . Prostate cancer Other        uncle  . Hyperlipidemia Other        grandparent  . Hypertension Other        grandparent/grandparent  . Kidney disease Other        aunt/uncle/grandparent  . Stroke Other        grandparent    Social History Social History   Tobacco Use  . Smoking status: Never Smoker  . Smokeless tobacco: Never Used  Substance Use Topics  . Alcohol use: Never    Frequency: Never  . Drug use: Never    Types: Marijuana     Allergies   Apple, Atrovent [ipratropium], Banana, Carrot oil, Fish allergy, Fruit & vegetable daily [nutritional  supplements], Peanut-containing drug products, Shellfish allergy, Tomato, Chicken meat (diagnostic), Latex, Pork-derived products, Soy allergy, Grass extracts [gramineae pollens], and Tree extract   Review of Systems Review of Systems  Constitutional: Negative for chills and fever.  HENT: Negative for congestion.   Eyes: Negative for visual disturbance.  Respiratory: Negative for shortness of breath.   Cardiovascular: Negative for chest pain.  Gastrointestinal: Negative for nausea and vomiting.  Genitourinary: Negative for difficulty urinating.  Musculoskeletal: Negative for myalgias.  Skin: Negative for rash.  Neurological: Positive for dizziness. Negative for weakness and numbness.     Physical Exam Updated Vital Signs BP (!) 141/94 (BP Location: Right Arm)   Pulse (!) 55   Temp 97.6 F (36.4 C) (Oral)   Resp 16   SpO2 99%   Physical Exam Vitals signs and nursing note reviewed.  Constitutional:      Appearance: He is not ill-appearing.  HENT:     Head: Normocephalic and atraumatic.  Eyes:     Extraocular Movements: Extraocular movements intact.     Conjunctiva/sclera: Conjunctivae normal.     Pupils: Pupils are equal, round, and reactive to light.  Neck:     Musculoskeletal: Neck supple.  Cardiovascular:     Rate and Rhythm: Regular rhythm. Bradycardia present.     Pulses: Normal pulses.  Pulmonary:     Effort: Pulmonary effort is normal.     Breath sounds: Normal breath sounds. No wheezing, rhonchi or rales.  Abdominal:     Palpations: Abdomen is soft.     Tenderness: There is no abdominal tenderness. There is no guarding or rebound.  Musculoskeletal:     Right lower leg: No edema.     Left lower leg: No edema.  Skin:    General: Skin is warm and dry.  Neurological:     Mental Status: He is alert.     Comments: CN 3-12 grossly intact A&O x4 GCS 15 Sensation and strength intact Gait nonataxic including with tandem walking Coordination with finger-to-nose  WNL Neg romberg, neg pronator drift       ED Treatments / Results  Labs (all labs ordered are listed, but only abnormal results are displayed) Labs Reviewed  BASIC METABOLIC PANEL - Abnormal; Notable for the following components:      Result Value   Glucose, Bld 60 (*)    Creatinine, Ser 1.25 (*)    All other components within normal limits  URINALYSIS, ROUTINE W REFLEX MICROSCOPIC - Abnormal; Notable for the following components:   Hgb urine dipstick SMALL (*)    All other  components within normal limits  RAPID URINE DRUG SCREEN, HOSP PERFORMED - Abnormal; Notable for the following components:   Tetrahydrocannabinol POSITIVE (*)    All other components within normal limits  CBG MONITORING, ED - Abnormal; Notable for the following components:   Glucose-Capillary 63 (*)    All other components within normal limits  CBG MONITORING, ED - Abnormal; Notable for the following components:   Glucose-Capillary 133 (*)    All other components within normal limits  CBC WITH DIFFERENTIAL/PLATELET    EKG None  Radiology No results found.  Procedures Procedures (including critical care time)  Medications Ordered in ED Medications  meclizine (ANTIVERT) tablet 25 mg (25 mg Oral Given 02/08/19 1316)  sodium chloride 0.9 % bolus 1,000 mL (0 mLs Intravenous Stopped 02/08/19 1440)     Initial Impression / Assessment and Plan / ED Course  I have reviewed the triage vital signs and the nursing notes.  Pertinent labs & imaging results that were available during my care of the patient were reviewed by me and considered in my medical decision making (see chart for details).    43 year old male presenting to the ED with complaints of intermittent dizziness for several years, worse recently. Pt very hard to get adequate information out of. He appears sleepy on exam but able to be aroused easily. Visualized patient ambulating to the restroom without difficulty. He has no focal neuro deficits  on exam. Pt very uncooperative during EOMs; unable to assess nystagmus. It appears pt has had CT scan done in the past with complaints of dizziness without any acute findings. Most recently seen in the ED in May 2020 for same without acute findings. Prescribed Meclizine and told to follow up with PCP. Question whether pt is taking this as prescribed. Will obtain EKG, baseline blood work, and orthostatic vital signs today and reevaluate. Meclizine given in the ED as well. Do not feel pt needs imaging of his head today as I do not suspect any acute findings.   Orthostatics within normal limits. EKG with sinus brady, questionable pericarditis. Pt without complaints of chest pain, shortness of breath, fevers, chills. Low suspicion. CBC without leukocytosis. Hgb stable. Creatinine 1.25, mildly elevated compared to previous. Will provide IVFs. Glucose of 60 as well. Will provide something to eat and drink and recheck CBG prior to discharge. U/A without infection. UDS positive for THC.   Upon reeval pt laying in chair with blanket covering head, sleeping. He reports he will not know if the meclizine works until he gets up to walk. Upon further discussion it appears pt has been taking half dose 12.5 mg once daily instead of 25 mg TID as prescribed. Will represcribe this for patient. He will need to increase fluid intake for the next few days and have creatinine rechecked with PCP in 1-2 weeks.   Lab Results  Component Value Date   CREATININE 1.25 (H) 02/08/2019   CREATININE 1.12 11/06/2018   CREATININE 1.02 09/06/2015   CBG recheck only 63. Patient given additional coke to drink which increased to 133. Feel he is stable for discharge at this time with close PCP follow up.        Final Clinical Impressions(s) / ED Diagnoses   Final diagnoses:  Dizziness  Vertigo  Elevated serum creatinine  Hypoglycemia    ED Discharge Orders         Ordered    meclizine (ANTIVERT) 25 MG tablet  3 times daily PRN  02/08/19 Channing, [place], PA-C 02/08/19 2125    Lennice Sites, DO 02/09/19 [phone]

## 2019-02-08 NOTE — ED Triage Notes (Signed)
Pt states he was diagnosed with vertigo 3 years ago and since has suffered from dizziness- pt has felt this way for over 2-3 weeks and has been a reoccurring issue. Pt has no headache no blurry vision or any other complaints. Pt has steady gait into ER.

## 2019-02-08 NOTE — ED Notes (Signed)
Pt stated that Sunday night (02/06/19) he was in the midst of the vertigo associated dizziness & stated to have blacked out, state that he caught himself but did not fall, pt is not clear to this RN whether he had LOC as well he just says "a little bit" when asked if he did. He has taken meclizine for this issue all along & states that the med does not have no affect on him anymore.

## 2019-02-16 MED FILL — ALBUTEROL SULFATE HFA 108 (: 108 (90 BAS | 25 days supply | Qty: 18 | Fill #1

## 2019-03-02 MED FILL — MECLIZINE 25 MG TABLET: 25 | 10 days supply | Qty: 30 | Fill #0

## 2019-03-30 MED FILL — MECLIZINE 25 MG TABLET: 25 | 30 days supply | Qty: 60 | Fill #1

## 2019-04-18 ENCOUNTER — Emergency Department (HOSPITAL_COMMUNITY)
Admission: EM | Admit: 2019-04-18 | Discharge: 2019-04-18 | Disposition: A | Discharge status: Home / Self Care | Payer: Self-pay | Attending: Emergency Medicine | Admitting: Emergency Medicine

## 2019-04-18 ENCOUNTER — Encounter (HOSPITAL_COMMUNITY): Payer: Self-pay | Admitting: Emergency Medicine

## 2019-04-18 ENCOUNTER — Other Ambulatory Visit: Payer: Self-pay

## 2019-04-18 DIAGNOSIS — J45909 Unspecified asthma, uncomplicated: Secondary | ICD-10-CM | POA: Insufficient documentation

## 2019-04-18 DIAGNOSIS — Z91040 Latex allergy status: Secondary | ICD-10-CM | POA: Insufficient documentation

## 2019-04-18 DIAGNOSIS — M545 Low back pain, unspecified: Secondary | ICD-10-CM

## 2019-04-18 DIAGNOSIS — Z91010 Allergy to peanuts: Secondary | ICD-10-CM | POA: Insufficient documentation

## 2019-04-18 DIAGNOSIS — M5441 Lumbago with sciatica, right side: Secondary | ICD-10-CM | POA: Insufficient documentation

## 2019-04-18 MED ORDER — IBUPROFEN 400 MG PO TABS
600.0000 mg | ORAL_TABLET | Freq: Once | ORAL | Status: AC
  Administered 2019-04-18: 600 mg via ORAL
  Filled 2019-04-18: qty 1

## 2019-04-18 MED ORDER — METHOCARBAMOL 500 MG PO TABS: 500.0000 mg | ORAL_TABLET | Freq: Two times a day (BID) | ORAL | 0 refills | Status: DC

## 2019-04-18 MED FILL — METHOCARBAMOL 500 MG TABS: 500 | 10 days supply | Qty: 20 | Fill #0

## 2019-04-18 NOTE — ED Provider Notes (Signed)
[facility] EMERGENCY DEPARTMENT Provider Note   CSN: [id number] Arrival date & time: 04/18/19  [id number]     History   Chief Complaint Chief Complaint  Patient presents with  . Back Pain    HPI Adrian [name] is a 43 y.o. male.     HPI    43 year old male presents today with complaints of back pain.  Patient notes yesterday he developed pain in his right lower back.  He notes this is worse with movement.  He denies any worsening symptoms with palpation.  He denies any loss of distal sensation strength and motor function.  No fever.  No known recent trauma.  No abdominal pain.   Past Medical History:  Diagnosis Date  . 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 Eczema   . Environmental allergies    grass  . Foot fracture, left   . Heart murmur   . Migraine   . Multiple food allergies    fish, tomatoes, peanuts, eggs, chicken and others    Patient Active Problem List   Diagnosis Date Noted  . Chronic migraine 09/13/2015  . Allergic rhinitis 04/09/2015  . Poor vision 04/09/2015  . Bilateral wrist pain 12/15/2014  . Pain, dental 05/19/2012  . Lipoma 01/25/2012  . Asthma in adult 01/13/2012  . Eczema, dyshidrotic 01/13/2012    Past Surgical History:  Procedure Laterality Date  . INGUINAL HERNIA REPAIR  1994        Home Medications    Prior to Admission medications   Medication Sig Start Date End Date Taking? Authorizing Provider  albuterol (VENTOLIN HFA) 108 (90 Base) MCG/ACT inhaler Inhale 2 puffs into the lungs every 6 (six) hours as needed for wheezing or shortness of breath (cough). 12/03/18   Gildardo Pounds, NP  cetirizine (ZYRTEC) 10 MG tablet Take 1 tablet (10 mg total) by mouth daily. Patient not taking: Reported on 02/08/2019 12/03/18   Gildardo Pounds, NP  divalproex (DEPAKOTE) 500 MG DR tablet Take 1 tablet (500 mg total) by mouth daily for 30 days. Patient not taking: Reported on 11/06/2018 08/02/18 11/06/18  Gildardo Pounds, NP  fluticasone (FLOVENT  HFA) 44 MCG/ACT inhaler Inhale 2 puffs into the lungs 2 (two) times a day for 30 days. 12/03/18 02/08/19  Gildardo Pounds, NP  hydrOXYzine (ATARAX/VISTARIL) 25 MG tablet Take 1 tablet (25 mg total) by mouth 3 (three) times daily as needed. 12/03/18   Gildardo Pounds, NP  meclizine (ANTIVERT) 25 MG tablet Take 1 tablet (25 mg total) by mouth 3 (three) times daily as needed for dizziness. 02/08/19   Alroy Bailiff, [place], PA-C  methocarbamol (ROBAXIN) 500 MG tablet Take 1 tablet (500 mg total) by mouth 2 (two) times daily. 04/18/19   Okey Regal, PA-C    Family History Family History  Problem Relation Age of Onset  . Asthma Father   . Diabetes Mother   . Hypertension Mother   . Clotting disorder Other        Aunt  . Breast cancer Other        Grandmother  . Prostate cancer Other        uncle  . Hyperlipidemia Other        grandparent  . Hypertension Other        grandparent/grandparent  . Kidney disease Other        aunt/uncle/grandparent  . Stroke Other        grandparent    Social History Social History  Tobacco Use  . Smoking status: Never Smoker  . Smokeless tobacco: Never Used  Substance Use Topics  . Alcohol use: Never    Frequency: Never  . Drug use: Never    Types: Marijuana     Allergies   Apple, Atrovent [ipratropium], Banana, Carrot oil, Fish allergy, Fruit & vegetable daily [nutritional supplements], Peanut-containing drug products, Shellfish allergy, Tomato, Chicken meat (diagnostic), Latex, Pork-derived products, Soy allergy, Grass extracts [gramineae pollens], and Tree extract   Review of Systems Review of Systems  All other systems reviewed and are negative.   Physical Exam Updated Vital Signs BP (!) 143/87 (BP Location: Left Arm)   Pulse (!) 52   Temp 98.1 F (36.7 C)   Resp 14   SpO2 99%   Physical Exam Vitals signs and nursing note reviewed.  Constitutional:      Appearance: He is well-developed.  HENT:     Head: Normocephalic and  atraumatic.  Eyes:     General: No scleral icterus.       Right eye: No discharge.        Left eye: No discharge.     Conjunctiva/sclera: Conjunctivae normal.     Pupils: Pupils are equal, round, and reactive to light.  Neck:     Musculoskeletal: Normal range of motion.     Vascular: No JVD.     Trachea: No tracheal deviation.  Pulmonary:     Effort: Pulmonary effort is normal.     Breath sounds: No stridor.  Abdominal:     General: There is no distension.     Palpations: Abdomen is soft.     Tenderness: There is no abdominal tenderness.  Musculoskeletal:     Comments: No CT or L-spine tenderness palpation, bilateral lower extremity sensation strength motor function intact straight leg negative bilateral  Neurological:     Mental Status: He is alert and oriented to person, place, and time.     Coordination: Coordination normal.  Psychiatric:        Behavior: Behavior normal.        Thought Content: Thought content normal.        Judgment: Judgment normal.      ED Treatments / Results  Labs (all labs ordered are listed, but only abnormal results are displayed) Labs Reviewed - No data to display  EKG None  Radiology No results found.  Procedures Procedures (including critical care time)  Medications Ordered in ED Medications  ibuprofen (ADVIL) tablet 600 mg (has no administration in time range)     Initial Impression / Assessment and Plan / ED Course  I have reviewed the triage vital signs and the nursing notes.  Pertinent labs & imaging results that were available during my care of the patient were reviewed by me and considered in my medical decision making (see chart for details).       43 year old male presents today with uncomplicated low back pain.  This is likely musculoskeletal in nature, he has no signs of intra-abdominal pathology no neurologic deficits or red flags.  Discharged with symptomatic care and strict return precautions.  Verbalized  understanding and agreement to today's plan had no further questions or concerns.  Final Clinical Impressions(s) / ED Diagnoses   Final diagnoses:  Acute right-sided low back pain without sciatica    ED Discharge Orders         Ordered    methocarbamol (ROBAXIN) 500 MG tablet  2 times daily     04/18/19 0920  Okey Regal, PA-C 04/18/19 [id number]    Carmin Muskrat, MD 04/18/19 [phone]

## 2019-04-18 NOTE — Discharge Instructions (Signed)
Please read attached information. If you experience any new or worsening signs or symptoms please return to the emergency room for evaluation. Please follow-up with your primary care provider or specialist as discussed. Please use medication prescribed only as directed and discontinue taking if you have any concerning signs or symptoms.   °

## 2019-04-18 NOTE — ED Triage Notes (Signed)
Patient presents ambulatory c/o lower back pain. Reports difficulty walking yesterday. Denies any bowel or bladder issues.

## 2019-06-10 ENCOUNTER — Ambulatory Visit: Payer: Self-pay | Attending: Nurse Practitioner | Admitting: Nurse Practitioner

## 2019-06-10 DIAGNOSIS — R7989 Other specified abnormal findings of blood chemistry: Secondary | ICD-10-CM

## 2019-06-10 DIAGNOSIS — H8110 Benign paroxysmal vertigo, unspecified ear: Secondary | ICD-10-CM

## 2019-06-10 DIAGNOSIS — J4520 Mild intermittent asthma, uncomplicated: Secondary | ICD-10-CM

## 2019-06-10 MED ORDER — MECLIZINE HCL 25 MG PO TABS: 25.0000 mg | ORAL_TABLET | Freq: Three times a day (TID) | ORAL | 2 refills | Status: AC | PRN

## 2019-06-10 MED ORDER — ALBUTEROL SULFATE HFA 108 (90 BASE) MCG/ACT IN AERS: 2.0000 | INHALATION_SPRAY | Freq: Four times a day (QID) | RESPIRATORY_TRACT | 1 refills | Status: DC | PRN

## 2019-06-10 MED FILL — ALBUTEROL SULFATE HFA 108 (: 108 (90 BAS | 25 days supply | Qty: 18 | Fill #0

## 2019-06-10 MED FILL — MECLIZINE 25 MG TABLET: 25 | 20 days supply | Qty: 60 | Fill #0

## 2019-06-10 NOTE — Progress Notes (Signed)
Virtual Visit via Telephone Note Due to national recommendations of social distancing due to [place] 19, telehealth visit is felt to be most appropriate for this patient at this time.  I discussed the limitations, risks, security and privacy concerns of performing an evaluation and management service by telephone and the availability of in person appointments. I also discussed with the patient that there may be a patient responsible charge related to this service. The patient expressed understanding and agreed to proceed.    I connected with Adrian [name] on 06/11/19  at   2:50 PM EST  EDT by telephone and verified that I am speaking with the correct person using two identifiers.   Consent I discussed the limitations, risks, security and privacy concerns of performing an evaluation and management service by telephone and the availability of in person appointments. I also discussed with the patient that there may be a patient responsible charge related to this service. The patient expressed understanding and agreed to proceed.   Location of Patient: Private Residence   Location of Provider: [place] and [place] participating in Telemedicine visit: Geryl Rankins FNP-BC [facility]    History of Present Illness: Telemedicine visit for: F/U to Dizziness  Dizziness  Symptoms are well controlled with meclizine 25 mg prn. He denies any falls, headaches, blurred vision.   Asthma Denies cough, shortness of breath or wheezing. He is not using the flovent inhaler and states he does not use his albuterol inhaler often and no more than 2 times per week   Past Medical History:  Diagnosis Date  . 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 Eczema   . Environmental allergies    grass  . Foot fracture, left   . Heart murmur   . Migraine   . Multiple food allergies    fish, tomatoes, peanuts, eggs, chicken and others    Past Surgical History:  Procedure Laterality  Date  . INGUINAL HERNIA REPAIR  1994    Family History  Problem Relation Age of Onset  . Asthma Father   . Diabetes Mother   . Hypertension Mother   . Clotting disorder Other        Aunt  . Breast cancer Other        Grandmother  . Prostate cancer Other        uncle  . Hyperlipidemia Other        grandparent  . Hypertension Other        grandparent/grandparent  . Kidney disease Other        aunt/uncle/grandparent  . Stroke Other        grandparent    Social History   Socioeconomic History  . Marital status: Single    Spouse name: Not on file  . Number of children: Not on file  . Years of education: Not on file  . Highest education [name]: Not on file  Occupational History  . Not on file  Tobacco Use  . Smoking status: Never Smoker  . Smokeless tobacco: Never Used  Substance and Sexual Activity  . Alcohol use: Never  . Drug use: Never    Types: Marijuana  . Sexual activity: Not on file  Other Topics Concern  . Not on file  Social History Narrative   ** Merged History Encounter **       Currently unemployed   1 son   Incarcerated on drug charges in 2010 for ~1 year.  Social Determinants of Health   Financial Resource Strain:   . Difficulty of Paying Living Expenses: Not on file  Food Insecurity:   . Worried About Charity fundraiser in the Last Year: Not on file  . Ran Out of Food in the Last Year: Not on file  Transportation Needs:   . Lack of Transportation (Medical): Not on file  . Lack of Transportation (Non-Medical): Not on file  Physical Activity:   . Days of Exercise per Week: Not on file  . Minutes of Exercise per Session: Not on file  Stress:   . Feeling of Stress : Not on file  Social Connections:   . Frequency of Communication with Friends and Family: Not on file  . Frequency of Social Gatherings with Friends and Family: Not on file  . Attends Religious Services: Not on file  . Active Member of Clubs or Organizations: Not on file  .  Attends Archivist Meetings: Not on file  . Marital Status: Not on file     Observations/Objective: Awake, alert and oriented x 3   Review of Systems  Constitutional: Negative for fever, malaise/fatigue and weight loss.  HENT: Negative.  Negative for nosebleeds.   Eyes: Negative.  Negative for blurred vision, double vision and photophobia.  Respiratory: Negative.  Negative for cough, shortness of breath and wheezing.   Cardiovascular: Negative.  Negative for chest pain, palpitations and leg swelling.  Gastrointestinal: Negative.  Negative for heartburn, nausea and vomiting.  Musculoskeletal: Negative.  Negative for myalgias.  Neurological: Positive for dizziness (well controlled). Negative for focal weakness, seizures and headaches.  Psychiatric/Behavioral: Negative.  Negative for suicidal ideas.    Assessment and Plan: Adrian [name] was seen today for medication refill.  Diagnoses and all orders for this visit:  Benign paroxysmal positional vertigo, unspecified laterality -     meclizine (ANTIVERT) 25 MG tablet; Take 1 tablet (25 mg total) by mouth 3 (three) times daily as needed for dizziness. -     TSH  Asthma in adult, mild intermittent, uncomplicated -     albuterol (VENTOLIN HFA) 108 (90 Base) MCG/ACT inhaler; Inhale 2 puffs into the lungs every 6 (six) hours as needed for wheezing or shortness of breath (cough).  Elevated serum creatinine -     [id number]+EGFR     Follow Up Instructions Return if symptoms worsen or fail to improve.     I discussed the assessment and treatment plan with the patient. The patient was provided an opportunity to ask questions and all were answered. The patient agreed with the plan and demonstrated an understanding of the instructions.   The patient was advised to call back or seek an in-person evaluation if the symptoms worsen or if the condition fails to improve as anticipated.  I provided 16 minutes of non-face-to-face time during this  encounter including median intraservice time, reviewing previous notes, labs, imaging, medications and explaining diagnosis and management.  Gildardo Pounds, FNP-BC

## 2019-06-11 ENCOUNTER — Encounter: Payer: Self-pay | Admitting: Nurse Practitioner

## 2019-06-11 LAB — CMP14+EGFR
ALT: 9 IU/L (ref 0–44)
AST: 15 IU/L (ref 0–40)
Albumin/Globulin Ratio: 1.3 (ref 1.2–2.2)
Albumin: 4.1 g/dL (ref 4.0–5.0)
Alkaline Phosphatase: 66 IU/L (ref 39–117)
BUN/Creatinine Ratio: 7 — ABNORMAL LOW (ref 9–20)
BUN: 8 mg/dL (ref 6–24)
Bilirubin Total: 0.5 mg/dL (ref 0.0–1.2)
CO2: 24 mmol/L (ref 20–29)
Calcium: 8.8 mg/dL (ref 8.7–10.2)
Chloride: 102 mmol/L (ref 96–106)
Creatinine, Ser: 1.22 mg/dL (ref 0.76–1.27)
GFR calc Af Amer: 83 mL/min/{1.73_m2} (ref >59)
GFR calc non Af Amer: 72 mL/min/{1.73_m2} (ref >59)
Globulin, Total: 3.1 g/dL (ref 1.5–4.5)
Glucose: 77 mg/dL (ref 65–99)
Potassium: 3.9 mmol/L (ref 3.5–5.2)
Sodium: 137 mmol/L (ref 134–144)
Total Protein: 7.2 g/dL (ref 6.0–8.5)

## 2019-06-11 LAB — TSH: TSH: 1.78 u[IU]/mL (ref 0.450–4.500)

## 2019-07-04 ENCOUNTER — Ambulatory Visit: Payer: HRSA Program | Attending: Internal Medicine

## 2019-07-04 DIAGNOSIS — Z20822 Contact with and (suspected) exposure to covid-19: Secondary | ICD-10-CM

## 2019-07-05 LAB — NOVEL CORONAVIRUS, NAA: SARS-CoV-2, NAA: NOT DETECTED

## 2019-09-03 ENCOUNTER — Encounter (HOSPITAL_COMMUNITY): Payer: Self-pay

## 2019-09-03 ENCOUNTER — Other Ambulatory Visit: Payer: Self-pay

## 2019-09-03 ENCOUNTER — Emergency Department (HOSPITAL_COMMUNITY): Payer: Self-pay

## 2019-09-03 ENCOUNTER — Emergency Department (HOSPITAL_COMMUNITY)
Admission: EM | Admit: 2019-09-03 | Discharge: 2019-09-03 | Disposition: A | Discharge status: Home / Self Care | Payer: Self-pay | Attending: Emergency Medicine | Admitting: Emergency Medicine

## 2019-09-03 DIAGNOSIS — R0789 Other chest pain: Secondary | ICD-10-CM | POA: Insufficient documentation

## 2019-09-03 DIAGNOSIS — Z91040 Latex allergy status: Secondary | ICD-10-CM | POA: Insufficient documentation

## 2019-09-03 DIAGNOSIS — R0781 Pleurodynia: Secondary | ICD-10-CM

## 2019-09-03 DIAGNOSIS — Z91010 Allergy to peanuts: Secondary | ICD-10-CM | POA: Insufficient documentation

## 2019-09-03 DIAGNOSIS — J45909 Unspecified asthma, uncomplicated: Secondary | ICD-10-CM | POA: Insufficient documentation

## 2019-09-03 DIAGNOSIS — W228XXA Striking against or struck by other objects, initial encounter: Secondary | ICD-10-CM | POA: Insufficient documentation

## 2019-09-03 NOTE — Discharge Instructions (Signed)
May take Tylenol and Ibuprofen for pain. Use the incentive spirometer.

## 2019-09-03 NOTE — ED Triage Notes (Signed)
Pt reports he hit his Left ribs 2 days ago w/the car door. Pain w/movement and deep breaths

## 2019-09-03 NOTE — ED Provider Notes (Signed)
[facility] EMERGENCY DEPARTMENT Provider Note   CSN: [id number] Arrival date & time: 09/03/19  [id number]   History Chief Complaint  Patient presents with  . Chest Pain   Adrian [name] is a 44 y.o. male with past medical history significant for asthma, murmur who presents for evaluation of left-sided rib pain.  Patient states 2 days ago he was opening his car door and hit the left anterior side of his ribs.  Has had pain since the incident.  Pain worse when he takes a deep breath.  Pain nonexertional does not radiate.  No prior history of PE or DVT.  Has not taken anything for his pain.  Rates pain 7/10.  Pain worse if he twists or if he lays on his left side.  Pain relieved by laying on right side.  No radiation of pain into left arm, left jaw.  No associated diaphoresis, nausea, vomiting.  Denies additional aggravating or alleviating factors.  Denies dever, chills, nausea, vomiting, shortness of breath, hemoptysis abdominal pain, diarrhea, dysuria, unilateral leg swelling, redness, warmth.  History obtained from patient past medical records.  No interpreter is used.  HPI     Past Medical History:  Diagnosis Date  . 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 Eczema   . Environmental allergies    grass  . Foot fracture, left   . Heart murmur   . Migraine   . Multiple food allergies    fish, tomatoes, peanuts, eggs, chicken and others    Patient Active Problem List   Diagnosis Date Noted  . Chronic migraine 09/13/2015  . Allergic rhinitis 04/09/2015  . Poor vision 04/09/2015  . Bilateral wrist pain 12/15/2014  . Pain, dental 05/19/2012  . Lipoma 01/25/2012  . Asthma in adult 01/13/2012  . Eczema, dyshidrotic 01/13/2012    Past Surgical History:  Procedure Laterality Date  . INGUINAL HERNIA REPAIR  1994       Family History  Problem Relation Age of Onset  . Asthma Father   . Diabetes Mother   . Hypertension Mother   . Clotting disorder Other        Aunt  . Breast  cancer Other        Grandmother  . Prostate cancer Other        uncle  . Hyperlipidemia Other        grandparent  . Hypertension Other        grandparent/grandparent  . Kidney disease Other        aunt/uncle/grandparent  . Stroke Other        grandparent    Social History   Tobacco Use  . Smoking status: Never Smoker  . Smokeless tobacco: Never Used  Substance Use Topics  . Alcohol use: Never  . Drug use: Never    Types: Marijuana    Home Medications Prior to Admission medications   Medication Sig Start Date End Date Taking? Authorizing Provider  albuterol (VENTOLIN HFA) 108 (90 Base) MCG/ACT inhaler Inhale 2 puffs into the lungs every 6 (six) hours as needed for wheezing or shortness of breath (cough). 06/10/19   Gildardo Pounds, NP  divalproex (DEPAKOTE) 500 MG DR tablet Take 1 tablet (500 mg total) by mouth daily for 30 days. Patient not taking: Reported on 11/06/2018 08/02/18 11/06/18  Gildardo Pounds, NP  fluticasone (FLOVENT HFA) 44 MCG/ACT inhaler Inhale 2 puffs into the lungs 2 (two) times a day for 30 days. Patient not taking: Reported on  06/10/2019 12/03/18 02/08/19  Gildardo Pounds, NP    Allergies    Apple, Atrovent [ipratropium], Banana, Carrot oil, Fish allergy, Fruit & vegetable daily [nutritional supplements], Peanut-containing drug products, Shellfish allergy, Tomato, Chicken meat (diagnostic), Latex, Pork-derived products, Soy allergy, Grass extracts [gramineae pollens], and Tree extract  Review of Systems   Review of Systems  Constitutional: Negative.   HENT: Negative.   Respiratory: Negative.   Cardiovascular: Negative for palpitations and leg swelling.       Left Rib pain  Gastrointestinal: Negative.   Genitourinary: Negative.   Musculoskeletal: Negative.   Skin: Negative.   Neurological: Negative.   All other systems reviewed and are negative.   Physical Exam Updated Vital Signs BP 116/81 (BP Location: Right Arm)   Pulse (!) 52   Temp 98.4 F  (36.9 C) (Oral)   Resp 16   Ht 5\' 11"  (1.803 m)   Wt 90.7 kg   SpO2 98%   BMI 27.89 kg/m   Physical Exam Vitals and nursing note reviewed.  Constitutional:      General: He is not in acute distress.    Appearance: He is well-developed. He is not ill-appearing, toxic-appearing or diaphoretic.  HENT:     Head: Atraumatic.  Eyes:     Pupils: Pupils are equal, round, and reactive to light.  Cardiovascular:     Rate and Rhythm: Normal rate and regular rhythm.     Pulses:          Radial pulses are 2+ on the right side and 2+ on the left side.     Heart sounds: Normal heart sounds.  Pulmonary:     Effort: Pulmonary effort is normal. No respiratory distress.  Chest:       Comments: Tenderness palpation to anterior chest wall over ribs 8 and 9.  No overlying skin changes.  No crepitus, deformity or flail chest Abdominal:     General: Bowel sounds are normal. There is no distension.     Palpations: Abdomen is soft.     Tenderness: There is no abdominal tenderness. There is no right CVA tenderness, left CVA tenderness, guarding or rebound. Negative signs include Murphy's sign and McBurney's sign.     Hernia: No hernia is present.     Comments: Soft, nontender without rebound or guarding.  No tenderness over spleen or splenomegaly  Musculoskeletal:        General: Normal range of motion.     Cervical back: Normal range of motion and neck supple.  Skin:    General: Skin is warm and dry.  Neurological:     Mental Status: He is alert.     ED Results / Procedures / Treatments   Labs (all labs ordered are listed, but only abnormal results are displayed) Labs Reviewed - No data to display  EKG None  Radiology DG Ribs Unilateral W/Chest Left  Result Date: 09/03/2019 CLINICAL DATA:  Slammed in car door today. Left rib pain. Initial encounter. EXAM: LEFT RIBS AND CHEST - 3+ VIEW COMPARISON:  05/17/2018 FINDINGS: No fracture or other bone lesions are seen involving the ribs. There  is no evidence of pneumothorax or pleural effusion. Both lungs are clear. Heart size and mediastinal contours are within normal limits. IMPRESSION: Negative. Electronically Signed   By: Marlaine Hind M.D.   On: 09/03/2019 09:52    Procedures Procedures (including critical care time)  Medications Ordered in ED Medications - No data to display  ED Course  I have reviewed  the triage vital signs and the nursing notes.  Pertinent labs & imaging results that were available during my care of the patient were reviewed by me and considered in my medical decision making (see chart for details).  44 year old male appears otherwise well presents for evaluation of left-sided rib pain which began 2 days ago after hitting ribs on car door.  Nonexertional.  Does have some pain with deep breathing however no prior history of PE, DVT.  No evidence of DVT on exam.  He has no tachycardia, tachypnea or hypoxia.  He is PERC negative, Wells criteria low risk.  I have low suspicion for PE as cause of his pain.  He has no overlying skin changes.  No flail chest.  Oxygen saturations 98% on room air with ambulation.  Plain film without rib fracture. Low suspicion for atypical ACS.  Suspect musculoskeletal pain.  Will DC home with Tylenol, NSAIDs, rest as well as incentive spirometer.  Low suspicion for acute cardiovascular or pulmonary etiology.  No evidence of acute asthma exacerbation.  Lungs clear.  The patient has been appropriately medically screened and/or stabilized in the ED. I have low suspicion for any other emergent medical condition which would require further screening, evaluation or treatment in the ED or require inpatient management.  Patient is hemodynamically stable and in no acute distress.  Patient able to ambulate in department prior to ED.  Evaluation does not show acute pathology that would require ongoing or additional emergent interventions while in the emergency department or further inpatient  treatment.  I have discussed the diagnosis with the patient and answered all questions.  Pain is been managed while in the emergency department and patient has no further complaints prior to discharge.  Patient is comfortable with plan discussed in room and is stable for discharge at this time.  I have discussed strict return precautions for returning to the emergency department.  Patient was encouraged to follow-up with PCP/specialist refer to at discharge.    MDM Rules/Calculators/A&P                      Adrian [name] was evaluated in Emergency Department on 09/03/2019 for the symptoms described in the history of present illness. He was evaluated in the context of the global COVID-19 pandemic, which necessitated consideration that the patient might be at risk for infection with the SARS-CoV-2 virus that causes COVID-19. Institutional protocols and algorithms that pertain to the evaluation of patients at risk for COVID-19 are in a state of rapid change based on information released by regulatory bodies including the CDC and federal and state organizations. These policies and algorithms were followed during the patient's care in the ED. Final Clinical Impression(s) / ED Diagnoses Final diagnoses:  Rib pain    Rx / DC Orders ED Discharge Orders    None       Jessalyn Hinojosa A, PA-C 09/03/19 1009    Blanchie Dessert, MD 09/04/19 [phone]

## 2019-09-03 NOTE — ED Notes (Signed)
Pt transported to XR via stretcher at this time.   

## 2019-09-03 NOTE — ED Notes (Signed)
Instructed patient on how to use incentive spirometry, pt verbalizes understanding and completed return demonstration at 1200 at this time.

## 2019-09-03 NOTE — ED Notes (Addendum)
Pt returned from XR at this time. Placed pt in position of comfort and advised of wait status.

## 2019-09-13 MED FILL — ALBUTEROL SULFATE HFA 108 (: 108 (90 BAS | 25 days supply | Qty: 18 | Fill #1

## 2019-11-30 ENCOUNTER — Ambulatory Visit: Payer: Self-pay | Attending: Nurse Practitioner | Admitting: Nurse Practitioner

## 2019-11-30 ENCOUNTER — Other Ambulatory Visit: Payer: Self-pay

## 2019-11-30 ENCOUNTER — Encounter: Payer: Self-pay | Admitting: Nurse Practitioner

## 2019-11-30 DIAGNOSIS — Z7689 Persons encountering health services in other specified circumstances: Secondary | ICD-10-CM

## 2019-11-30 DIAGNOSIS — H8110 Benign paroxysmal vertigo, unspecified ear: Secondary | ICD-10-CM

## 2019-11-30 DIAGNOSIS — Z114 Encounter for screening for human immunodeficiency virus [HIV]: Secondary | ICD-10-CM

## 2019-11-30 MED ORDER — MECLIZINE HCL 25 MG PO TABS: 25.0000 mg | ORAL_TABLET | Freq: Three times a day (TID) | ORAL | 2 refills | Status: AC

## 2019-11-30 MED FILL — MECLIZINE 25 MG TABLET: 25 | 20 days supply | Qty: 60 | Fill #1

## 2019-11-30 NOTE — Progress Notes (Signed)
Virtual Visit via Telephone Note Due to national recommendations of social distancing due to [place] 19, telehealth visit is felt to be most appropriate for this patient at this time.  I discussed the limitations, risks, security and privacy concerns of performing an evaluation and management service by telephone and the availability of in person appointments. I also discussed with the patient that there may be a patient responsible charge related to this service. The patient expressed understanding and agreed to proceed.    I connected with Adrian [name] on 11/30/19  at  10:00 AM EDT  EDT by telephone and verified that I am speaking with the correct person using two identifiers.   Consent I discussed the limitations, risks, security and privacy concerns of performing an evaluation and management service by telephone and the availability of in person appointments. I also discussed with the patient that there may be a patient responsible charge related to this service. The patient expressed understanding and agreed to proceed.   Location of Patient: Private Residence   Location of Provider: [place] participating in Telemedicine visit: Geryl Rankins FNP-BC [place]    History of Present Illness: Telemedicine visit for: F/U   Dizziness Has not been taking meclizine. States he just doesn't feel right. Vertigo has been present for several years. States meclizine does not help however he has not been taking it as prescribed. He does endorse marijuana use but denies alcohol use. He does not smoke cigarettes.  Dizziness sometimes makes him unsteady on his feet. described as feeling like he is on a roller coaster, worse with turning his head side to side or changes in position.  Patient denies nausea, vomiting, syncope.  Patient has been advised to apply for financial assistance and schedule to see our financial counselor.     Requesting prednisone for his dyshydrotic eczema.     Past Medical History:  Diagnosis Date  . 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 Eczema   . Environmental allergies    grass  . Foot fracture, left   . Heart murmur   . Migraine   . Multiple food allergies    fish, tomatoes, peanuts, eggs, chicken and others    Past Surgical History:  Procedure Laterality Date  . INGUINAL HERNIA REPAIR  1994    Family History  Problem Relation Age of Onset  . Asthma Father   . Diabetes Mother   . Hypertension Mother   . Clotting disorder Other        Aunt  . Breast cancer Other        Grandmother  . Prostate cancer Other        uncle  . Hyperlipidemia Other        grandparent  . Hypertension Other        grandparent/grandparent  . Kidney disease Other        aunt/uncle/grandparent  . Stroke Other        grandparent    Social History   Socioeconomic History  . Marital status: Single    Spouse name: Not on file  . Number of children: Not on file  . Years of education: Not on file  . Highest education [name]: Not on file  Occupational History  . Not on file  Tobacco Use  . Smoking status: Never Smoker  . Smokeless tobacco: Never Used  Substance and Sexual Activity  . Alcohol use: Never  . Drug use:  Yes    Types: Marijuana  . Sexual activity: Not on file  Other Topics Concern  . Not on file  Social History Narrative   ** Merged History Encounter **       Currently unemployed   1 son   Incarcerated on drug charges in 2010 for ~1 year.    Social Determinants of Health   Financial Resource Strain:   . Difficulty of Paying Living Expenses:   Food Insecurity:   . Worried About Charity fundraiser in the Last Year:   . Arboriculturist in the Last Year:   Transportation Needs:   . Film/video editor (Medical):   Marland Kitchen Lack of Transportation (Non-Medical):   Physical Activity:   . Days of Exercise per Week:   . Minutes of Exercise per Session:   Stress:   . Feeling of  Stress :   Social Connections:   . Frequency of Communication with Friends and Family:   . Frequency of Social Gatherings with Friends and Family:   . Attends Religious Services:   . Active Member of Clubs or Organizations:   . Attends Archivist Meetings:   Marland Kitchen Marital Status:      Observations/Objective: Awake, alert and oriented x 3   Review of Systems  Constitutional: Negative for fever, malaise/fatigue and weight loss.  HENT: Negative.  Negative for nosebleeds.   Eyes: Negative.  Negative for blurred vision, double vision and photophobia.  Respiratory: Negative.  Negative for cough and shortness of breath.   Cardiovascular: Negative.  Negative for chest pain, palpitations and leg swelling.  Gastrointestinal: Negative.  Negative for heartburn, nausea and vomiting.  Musculoskeletal: Negative.  Negative for myalgias.  Skin: Positive for rash (bilateral hands).  Neurological: Positive for dizziness. Negative for focal weakness, seizures and headaches.  Psychiatric/Behavioral: Negative.  Negative for suicidal ideas.    Assessment and Plan: Adrian [name] was seen today for eczema.  Diagnoses and all orders for this visit:  Benign paroxysmal positional vertigo, unspecified laterality -     meclizine (ANTIVERT) 25 MG tablet; Take 1 tablet (25 mg total) by mouth 3 (three) times daily. -     CBC; Future -     CMP14+EGFR; Future -     VITAMIN D 25 Hydroxy (Vit-D Deficiency, Fractures); Future -     Hemoglobin A1c; Future  Encounter for assessment of STD exposure -     POCT URINALYSIS DIP (CLINITEK); Future -     RPR; Future -     Lipid panel; Future -     Urine cytology ancillary only; Future  Encounter for screening for HIV -     HIV antibody (with reflex); Future     Follow Up Instructions Return in about 6 weeks (around 01/11/2020).     I discussed the assessment and treatment plan with the patient. The patient was provided an opportunity to ask questions and all  were answered. The patient agreed with the plan and demonstrated an understanding of the instructions.   The patient was advised to call back or seek an in-person evaluation if the symptoms worsen or if the condition fails to improve as anticipated.  I provided 17 minutes of non-face-to-face time during this encounter including median intraservice time, reviewing previous notes, labs, imaging, medications and explaining diagnosis and management.  Gildardo Pounds, FNP-BC

## 2019-12-02 ENCOUNTER — Other Ambulatory Visit: Payer: Self-pay

## 2019-12-30 MED FILL — MECLIZINE 25 MG TABLET: 25 | 30 days supply | Qty: 90 | Fill #0

## 2020-02-29 ENCOUNTER — Ambulatory Visit: Payer: Self-pay | Admitting: Nurse Practitioner

## 2020-03-01 ENCOUNTER — Ambulatory Visit: Payer: Self-pay | Attending: Nurse Practitioner | Admitting: Physician Assistant

## 2020-03-01 ENCOUNTER — Other Ambulatory Visit: Payer: Self-pay

## 2020-03-01 ENCOUNTER — Encounter: Payer: Self-pay | Admitting: Physician Assistant

## 2020-03-01 DIAGNOSIS — J4520 Mild intermittent asthma, uncomplicated: Secondary | ICD-10-CM

## 2020-03-01 DIAGNOSIS — R42 Dizziness and giddiness: Secondary | ICD-10-CM

## 2020-03-01 DIAGNOSIS — R110 Nausea: Secondary | ICD-10-CM

## 2020-03-01 MED ORDER — FLOVENT HFA 44 MCG/ACT IN AERO: 1.0000 | INHALATION_SPRAY | RESPIRATORY_TRACT | 3 refills | Status: DC

## 2020-03-01 MED ORDER — MECLIZINE HCL 25 MG PO TABS: 25.0000 mg | ORAL_TABLET | Freq: Three times a day (TID) | ORAL | 2 refills | Status: DC

## 2020-03-01 MED ORDER — PROMETHAZINE HCL 25 MG PO TABS: ORAL_TABLET | ORAL | 0 refills | Status: DC

## 2020-03-01 MED FILL — PROMETHAZINE 25 MG TABLET: 25 | 5 days supply | Qty: 20 | Fill #0

## 2020-03-01 MED FILL — MECLIZINE 25 MG TABLET: 25 | 10 days supply | Qty: 30 | Fill #0

## 2020-03-01 NOTE — Progress Notes (Signed)
Virtual Visit via Telephone Note  I connected with Adrian [name] on 03/01/20 at  2:30 PM EDT by telephone and verified that I am speaking with the correct person using two identifiers.   I discussed the limitations, risks, security and privacy concerns of performing an evaluation and management service by telephone and the availability of in person appointments. I also discussed with the patient that there may be a patient responsible charge related to this service. The patient expressed understanding and agreed to proceed.  PATIENT visit by telephone virtually in the context of Covid-19 pandemic. Patient location:  home My Location:  [place] office Persons on the call:  Me and the patient   History of Present Illness:  Patient is still experiencing dizziness.  Never came and got the labs that his PCP ordered.  This has been going on since he was about 44 years old.  Sometimes has N/V.  Not able to work much.  No abdominal pain.  Wants to see a neurologist.  No vision changes.      Observations/Objective:  NAD.  Talks over me/interrupts frequently.  Difficult to obtain accurate history.     Assessment and Plan: 1. Dizziness Unchanged/present for years - Ambulatory referral to Neurology - promethazine (PHENERGAN) 25 MG tablet; 1/2 to 1 tablet every 4 to 6 hours as needed for nausea or vomiting  Dispense: 20 tablet; Refill: 0 - meclizine (ANTIVERT) 25 MG tablet; Take 1 tablet (25 mg total) by mouth 3 (three) times daily.  Dispense: 30 tablet; Refill: 2 - fluticasone (FLOVENT HFA) 44 MCG/ACT inhaler; Inhale 1 puff into the lungs 1 day or 1 dose for 1 dose.  Dispense: 10.6 g; Refill: 3  2. Nausea - promethazine (PHENERGAN) 25 MG tablet; 1/2 to 1 tablet every 4 to 6 hours as needed for nausea or vomiting  Dispense: 20 tablet; Refill: 0 - meclizine (ANTIVERT) 25 MG tablet; Take 1 tablet (25 mg total) by mouth 3 (three) times daily.  Dispense: 30 tablet; Refill: 2  3. Asthma in adult, mild  intermittent, uncomplicated - fluticasone (FLOVENT HFA) 44 MCG/ACT inhaler; Inhale 1 puff into the lungs 1 day or 1 dose for 1 dose.  Dispense: 10.6 g; Refill: 3    Follow Up Instructions: See PCP in 2-3 months;  Sooner if needed/come and get labs now   I discussed the assessment and treatment plan with the patient. The patient was provided an opportunity to ask questions and all were answered. The patient agreed with the plan and demonstrated an understanding of the instructions.   The patient was advised to call back or seek an in-person evaluation if the symptoms worsen or if the condition fails to improve as anticipated.  I provided 16 minutes of non-face-to-face time during this encounter.   Freeman Caldron, PA-C  Patient ID: Adrian [name], male   DOB: 29-Jan-1976, 44 y.o.   MRN: [id number]

## 2020-03-01 NOTE — Progress Notes (Signed)
States that he feel sick all the time  States that he throws up all the time.  Having pains in abdomen.

## 2020-03-14 ENCOUNTER — Encounter: Payer: Self-pay | Admitting: Neurology

## 2020-03-19 ENCOUNTER — Telehealth: Payer: Self-pay | Admitting: Nurse Practitioner

## 2020-03-19 ENCOUNTER — Ambulatory Visit: Payer: Self-pay

## 2020-03-19 NOTE — Telephone Encounter (Signed)
Pt called stating that the medications that he was prescribed, meclizine and promethazine, are not working. He states that he is still experiencing dizziness and vomiting. Pt states that he has been trying to find out himself and he notices that he is having a difference in heartbeat sometimes. Please advise.      [place], [place] [address]  [place] [address]  Phone: [phone] Fax: [phone]  Hours: Not open 24 hours

## 2020-03-19 NOTE — Telephone Encounter (Signed)
Patient transferred to NT after this call and he was triaged, appointment scheduled for tomorrow with PCP.

## 2020-03-19 NOTE — Telephone Encounter (Signed)
Patient called and says he's been experiencing dizziness that's not getting any better. He says he had a virtual visit earlier this month and was prescribed Meclizine (03/01/20) and it's not helping the dizziness. He says he left work today because was dizzy and vomiting. He says now he's in the bed and still feeling dizzy. He says this is nothing new to him, because he's experienced dizziness off and on since childhood, but it's getting worse. He says he has chest tightness at times and even pains, but not at this time. He says he moves his head around and is dizzy. He says he can get up and walk, but is unsteady. No other symptoms. Appointment scheduled for tomorrow at 1110 with Geryl Rankins, NP, care advice given, he verbalized understanding.  Reason for Disposition . [1] MODERATE dizziness (e.g., interferes with normal activities) AND [2] has been evaluated by physician for this  Answer Assessment - Initial Assessment Questions 1. DESCRIPTION: "Describe your dizziness."     Lightheaded 2. LIGHTHEADED: "Do you feel lightheaded?" (e.g., somewhat faint, woozy, weak upon standing)      Yes 3. VERTIGO: "Do you feel like either you or the room is spinning or tilting?" (i.e. vertigo)     Yes 4. SEVERITY: "How bad is it?"  "Do you feel like you are going to faint?" "Can you stand and walk?"   - MILD: Feels slightly dizzy, but walking normally.   - MODERATE: Feels very unsteady when walking, but not falling; interferes with normal activities (e.g., school, work) .   - SEVERE: Unable to walk without falling, or requires assistance to walk without falling; feels like passing out now.      Moderate 5. ONSET:  "When did the dizziness begin?"      Everyday but it's getting worse 6. AGGRAVATING FACTORS: "Does anything make it worse?" (e.g., standing, change in head position)      Change head position 7. HEART RATE: "Can you tell me your heart rate?" "How many beats in 15 seconds?"  (Note: not all patients  can do this)       N/A 8. CAUSE: "What do you think is causing the dizziness?"     I don't know 9. RECURRENT SYMPTOM: "Have you had dizziness before?" If Yes, ask: "When was the last time?" "What happened that time?"     Yes, this has been going for years off and on 10. OTHER SYMPTOMS: "Do you have any other symptoms?" (e.g., fever, chest pain, vomiting, diarrhea, bleeding)      Vomiting today, chest tightness 11. PREGNANCY: "Is there any chance you are pregnant?" "When was your last menstrual period?"       N/A  Protocols used: DIZZINESS [facility]

## 2020-03-20 ENCOUNTER — Other Ambulatory Visit: Payer: Self-pay | Admitting: Nurse Practitioner

## 2020-03-20 ENCOUNTER — Other Ambulatory Visit: Payer: Self-pay

## 2020-03-20 ENCOUNTER — Ambulatory Visit: Payer: Self-pay | Attending: Nurse Practitioner | Admitting: Nurse Practitioner

## 2020-03-20 ENCOUNTER — Encounter: Payer: Self-pay | Admitting: Nurse Practitioner

## 2020-03-20 VITALS — BP 127/95 | HR 57 | Temp 97.7°F | Ht 71.0 in | Wt 226.0 lb

## 2020-03-20 DIAGNOSIS — Z7689 Persons encountering health services in other specified circumstances: Secondary | ICD-10-CM

## 2020-03-20 DIAGNOSIS — Z1159 Encounter for screening for other viral diseases: Secondary | ICD-10-CM

## 2020-03-20 DIAGNOSIS — F32A Depression, unspecified: Secondary | ICD-10-CM

## 2020-03-20 DIAGNOSIS — F329 Major depressive disorder, single episode, unspecified: Secondary | ICD-10-CM

## 2020-03-20 DIAGNOSIS — Z114 Encounter for screening for human immunodeficiency virus [HIV]: Secondary | ICD-10-CM

## 2020-03-20 DIAGNOSIS — K089 Disorder of teeth and supporting structures, unspecified: Secondary | ICD-10-CM

## 2020-03-20 DIAGNOSIS — H8110 Benign paroxysmal vertigo, unspecified ear: Secondary | ICD-10-CM

## 2020-03-20 DIAGNOSIS — F419 Anxiety disorder, unspecified: Secondary | ICD-10-CM

## 2020-03-20 MED ORDER — ONDANSETRON 8 MG PO TBDP: 8.0000 mg | ORAL_TABLET | Freq: Three times a day (TID) | ORAL | 1 refills | Status: DC | PRN

## 2020-03-20 MED ORDER — HYDROXYZINE HCL 25 MG PO TABS: 25.0000 mg | ORAL_TABLET | Freq: Three times a day (TID) | ORAL | 1 refills | Status: DC | PRN

## 2020-03-20 MED ORDER — CHLORHEXIDINE GLUCONATE 0.12 % MT SOLN: 15.0000 mL | Freq: Two times a day (BID) | OROMUCOSAL | 0 refills | Status: DC

## 2020-03-20 MED FILL — hydrOXYzine HCL 25 MG TABS: 25 | 20 days supply | Qty: 60 | Fill #0

## 2020-03-20 MED FILL — CHLORHEXIDINE 0.12% RINSE: 0.12 | 14 days supply | Qty: 473 | Fill #0

## 2020-03-20 NOTE — Progress Notes (Signed)
Assessment & Plan:  Adrian [name] was seen today for dizziness.  Diagnoses and all orders for this visit:  Benign paroxysmal positional vertigo, unspecified laterality -     DG Sinuses Complete; Future -     ondansetron (ZOFRAN ODT) 8 MG disintegrating tablet; Take 1 tablet (8 mg total) by mouth every 8 (eight) hours as needed for nausea or vomiting. -     Hemoglobin A1c -     VITAMIN D 25 Hydroxy (Vit-D Deficiency, Fractures) -     CMP14+EGFR -     CBC  Need for hepatitis C screening test -     Hepatitis C Antibody  Encounter for screening for HIV -     HIV antibody (with reflex) -     HIV antibody (with reflex)  Encounter for assessment of STD exposure -     Lipid panel -     RPR  Poor dentition -     chlorhexidine (PERIDEX) 0.12 % solution; Use as directed 15 mLs in the mouth or throat 2 (two) times daily.  Anxiety and depression -     hydrOXYzine (ATARAX/VISTARIL) 25 MG tablet; Take 1 tablet (25 mg total) by mouth 3 (three) times daily as needed.    Patient has been counseled on age-appropriate routine health concerns for screening and prevention. These are reviewed and up-to-date. Referrals have been placed accordingly. Immunizations are up-to-date or declined.    Subjective:   Chief Complaint  Patient presents with  . Dizziness    Pt. stated he still get dizzy. The medication he is on is not helping him.   HPI Adrian [name] 44 y.o. male presents to office today for follow to vertigo.  has a past medical history of Asthma, Eczema, Environmental allergies, Foot fracture, left, Heart murmur, Migraine, and Multiple food allergies.   Vertigo Chronic and ongoing for several years.  I have recommended on numerous visits that he apply for the financial assistance. He has not done this yet.  Unfortunately due to insurance issues he has not been able to be evaluated by ENT or neurology. There is a head CT that was performed 08/12/2014 for dizziness with no acute findings.  The  patient describes the symptoms as lightheadedness. Symptoms are exacerbated by none identified The patient also complains of Sinus congestion and headaches. He denies aural pressure, otalgia, otorrhea, tinnitus and hearing loss.  He has been treated with meclizine (Antivert) in the past which did provide relief of symptoms however he states the meclizine is currently ineffective. He does have a history of allergies.   Review of Systems  Constitutional: Negative for fever, malaise/fatigue and weight loss.  HENT: Positive for congestion. Negative for nosebleeds.        Poor dentition  Eyes: Negative.  Negative for blurred vision, double vision and photophobia.  Respiratory: Negative.  Negative for cough and shortness of breath.   Cardiovascular: Negative.  Negative for chest pain, palpitations and leg swelling.  Gastrointestinal: Negative.  Negative for heartburn, nausea and vomiting.  Musculoskeletal: Negative.  Negative for myalgias.  Neurological: Positive for dizziness and headaches. Negative for sensory change, focal weakness and seizures.  Psychiatric/Behavioral: Positive for depression. Negative for suicidal ideas. The patient is nervous/anxious.     Past Medical History:  Diagnosis Date  . 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 Eczema   . Environmental allergies    grass  . Foot fracture, left   . Heart murmur   . Migraine   . Multiple food  allergies    fish, tomatoes, peanuts, eggs, chicken and others    Past Surgical History:  Procedure Laterality Date  . INGUINAL HERNIA REPAIR  1994    Family History  Problem Relation Age of Onset  . Asthma Father   . Diabetes Mother   . Hypertension Mother   . Clotting disorder Other        Aunt  . Breast cancer Other        Grandmother  . Prostate cancer Other        uncle  . Hyperlipidemia Other        grandparent  . Hypertension Other        grandparent/grandparent  . Kidney disease Other        aunt/uncle/grandparent  . Stroke Other         grandparent    Social History Reviewed with no changes to be made today.   Outpatient Medications Prior to Visit  Medication Sig Dispense Refill  . albuterol (VENTOLIN HFA) 108 (90 Base) MCG/ACT inhaler Inhale 2 puffs into the lungs every 6 (six) hours as needed for wheezing or shortness of breath (cough). 18 g 1  . meclizine (ANTIVERT) 25 MG tablet Take 1 tablet (25 mg total) by mouth 3 (three) times daily. 30 tablet 2  . TRIAMCINOLONE PO Take by mouth.    . promethazine (PHENERGAN) 25 MG tablet 1/2 to 1 tablet every 4 to 6 hours as needed for nausea or vomiting 20 tablet 0  . divalproex (DEPAKOTE) 500 MG DR tablet Take 1 tablet (500 mg total) by mouth daily for 30 days. (Patient not taking: Reported on 11/06/2018) 30 tablet 1  . fluticasone (FLOVENT HFA) 44 MCG/ACT inhaler Inhale 1 puff into the lungs 1 day or 1 dose for 1 dose. 10.6 g 3   No facility-administered medications prior to visit.    Allergies  Allergen Reactions  . Apple Shortness Of Breath, Itching, Nausea Only and Swelling    Tongue itches also  . Atrovent [Ipratropium] Anaphylaxis    Atrovent contains peanut oil and PT has a HX of anaphlaxis to peanuts  . Banana Shortness Of Breath and Itching    Makes tongue itch  . Carrot Oil Shortness Of Breath and Itching  . Fish Allergy Anaphylaxis, Hives and Swelling  . Fruit & Vegetable Daily [Nutritional Supplements] Shortness Of Breath and Itching    Patient stated that he allergic to most all fruits  . Peanut-Containing Drug Products Anaphylaxis, Hives, Nausea And Vomiting and Swelling  . Shellfish Allergy Anaphylaxis, Hives, Swelling and Other (See Comments)    "ALL SEAFOOD"  . Tomato Anaphylaxis, Hives and Swelling  . Chicken Meat (Diagnostic) Other (See Comments)    Tested allergic to this years ago  . Latex Itching    No powdered gloves!!  . Pork-Derived Products Other (See Comments)    CAUSES SEVERE HEADACHES  . Soy Allergy Other (See Comments)    Tested  allergic to this years ago  . Grass Extracts [Gramineae Pollens] Hives, Rash and Other (See Comments)    Wheezing also  . Tree Extract Other (See Comments)    Patient tested allergic to this years ago    Labs are essentially normal. There are some minor variations in your blood work that do not require any additional work up at this time. Will continue to monitor. Make sure you are drinking at least 48 oz of water per day. Work on eating a low fat, heart healthy diet and participate  in regular aerobic exercise program to control as well. Exercise at least  30 minutes per day-5 days per week. Avoid red meat. No fried foods. No junk foods, sodas, sugary foods or drinks, unhealthy snacking, alcohol or smoking.         Objective:    BP (!) 127/95 (BP Location: Right Arm, Patient Position: Sitting, Cuff Size: Normal)   Pulse (!) 57   Temp 97.7 F (36.5 C) (Temporal)   Ht '5\' 11"'  (1.803 m)   Wt 226 lb (102.5 kg)   SpO2 97%   BMI 31.52 kg/m  Wt Readings from Last 3 Encounters:  03/20/20 226 lb (102.5 kg)  09/03/19 200 lb (90.7 kg)  11/06/18 200 lb (90.7 kg)    Physical Exam Vitals and nursing note reviewed.  Constitutional:      Appearance: He is well-developed.  HENT:     Head: Normocephalic and atraumatic.     Right Ear: Hearing, tympanic membrane, ear canal and external ear normal.     Left Ear: Hearing, tympanic membrane, ear canal and external ear normal.     Nose: Mucosal edema present.  Cardiovascular:     Rate and Rhythm: Normal rate and regular rhythm.     Heart sounds: Normal heart sounds. No murmur heard.  No friction rub. No gallop.   Pulmonary:     Effort: Pulmonary effort is normal. No tachypnea or respiratory distress.     Breath sounds: Normal breath sounds. No decreased breath sounds, wheezing, rhonchi or rales.  Chest:     Chest wall: No tenderness.  Abdominal:     General: Bowel sounds are normal.     Palpations: Abdomen is soft.  Musculoskeletal:         General: Normal range of motion.     Cervical back: Normal range of motion.  Skin:    General: Skin is warm and dry.  Neurological:     Mental Status: He is alert and oriented to person, place, and time.     Cranial Nerves: Cranial nerves are intact.     Sensory: Sensation is intact.     Motor: Motor function is intact.     Coordination: Coordination is intact. Coordination normal.     Gait: Gait is intact.  Psychiatric:        Behavior: Behavior normal. Behavior is cooperative.        Thought Content: Thought content normal.        Judgment: Judgment normal.         Patient has been counseled extensively about nutrition and exercise as well as the importance of adherence with medications and regular follow-up. The patient was given clear instructions to go to ER or return to medical center if symptoms don't improve, worsen or new problems develop. The patient verbalized understanding.   Follow-up: Return in about 6 weeks (around 05/01/2020).   Gildardo Pounds, FNP-BC [facility] and [facility] [place], [place]

## 2020-03-21 LAB — CMP14+EGFR
ALT: 16 IU/L (ref 0–44)
AST: 15 IU/L (ref 0–40)
Albumin/Globulin Ratio: 1.4 (ref 1.2–2.2)
Albumin: 4.1 g/dL (ref 4.0–5.0)
Alkaline Phosphatase: 71 IU/L (ref 44–121)
BUN/Creatinine Ratio: 5 — ABNORMAL LOW (ref 9–20)
BUN: 6 mg/dL (ref 6–24)
Bilirubin Total: 0.3 mg/dL (ref 0.0–1.2)
CO2: 26 mmol/L (ref 20–29)
Calcium: 9.3 mg/dL (ref 8.7–10.2)
Chloride: 104 mmol/L (ref 96–106)
Creatinine, Ser: 1.23 mg/dL (ref 0.76–1.27)
GFR calc Af Amer: 82 mL/min/{1.73_m2} (ref >59)
GFR calc non Af Amer: 71 mL/min/{1.73_m2} (ref >59)
Globulin, Total: 3 g/dL (ref 1.5–4.5)
Glucose: 120 mg/dL — ABNORMAL HIGH (ref 65–99)
Potassium: 4.1 mmol/L (ref 3.5–5.2)
Sodium: 142 mmol/L (ref 134–144)
Total Protein: 7.1 g/dL (ref 6.0–8.5)

## 2020-03-21 LAB — CBC
Hematocrit: 41.2 % (ref 37.5–51.0)
Hemoglobin: 13.4 g/dL (ref 13.0–17.7)
MCH: 26.9 pg (ref 26.6–33.0)
MCHC: 32.5 g/dL (ref 31.5–35.7)
MCV: 83 fL (ref 79–97)
Platelets: 240 10*3/uL (ref 150–450)
RBC: 4.98 x10E6/uL (ref 4.14–5.80)
RDW: 13.2 % (ref 11.6–15.4)
WBC: 5.9 10*3/uL (ref 3.4–10.8)

## 2020-03-21 LAB — LIPID PANEL
Chol/HDL Ratio: 3.9 ratio (ref 0.0–5.0)
Cholesterol, Total: 125 mg/dL (ref 100–199)
HDL: 32 mg/dL — ABNORMAL LOW (ref >39)
LDL Chol Calc (NIH): 78 mg/dL (ref 0–99)
Triglycerides: 72 mg/dL (ref 0–149)
VLDL Cholesterol Cal: 15 mg/dL (ref 5–40)

## 2020-03-21 LAB — VITAMIN D 25 HYDROXY (VIT D DEFICIENCY, FRACTURES): Vit D, 25-Hydroxy: 15.2 ng/mL — ABNORMAL LOW (ref 30.0–100.0)

## 2020-03-21 LAB — HEMOGLOBIN A1C
Est. average glucose Bld gHb Est-mCnc: 111 mg/dL
Hgb A1c MFr Bld: 5.5 % (ref 4.8–5.6)

## 2020-03-21 LAB — RPR: RPR Ser Ql: NONREACTIVE

## 2020-03-21 LAB — HEPATITIS C ANTIBODY: Hep C Virus Ab: <0.1 s/co ratio (ref 0.0–0.9)

## 2020-03-21 LAB — HIV ANTIBODY (ROUTINE TESTING W REFLEX): HIV Screen 4th Generation wRfx: NONREACTIVE

## 2020-03-21 MED FILL — ONDANSETRON ODT 8 MG TABLET: 8 | 10 days supply | Qty: 30 | Fill #0

## 2020-03-23 ENCOUNTER — Other Ambulatory Visit: Payer: Self-pay | Admitting: Nurse Practitioner

## 2020-03-23 MED ORDER — VITAMIN D (ERGOCALCIFEROL) 1.25 MG (50000 UNIT) PO CAPS: 50000.0000 [IU] | ORAL_CAPSULE | ORAL | 1 refills | Status: DC

## 2020-03-23 MED FILL — VIT D2 1.25 MG (50,000 UNIT: 1.25 MG | 84 days supply | Qty: 12 | Fill #0

## 2020-04-06 ENCOUNTER — Other Ambulatory Visit: Payer: Self-pay | Admitting: Nurse Practitioner

## 2020-04-06 DIAGNOSIS — J4520 Mild intermittent asthma, uncomplicated: Secondary | ICD-10-CM

## 2020-04-06 MED FILL — ALBUTEROL SULFATE HFA 108 (: 108 (90 BAS | 25 days supply | Qty: 18 | Fill #0

## 2020-04-06 NOTE — Telephone Encounter (Signed)
Requested Prescriptions  Pending Prescriptions Disp Refills  . albuterol (VENTOLIN HFA) 108 (90 Base) MCG/ACT inhaler [Pharmacy Med Name: ALBUTEROL SULFATE HFA 108 ( 108 (90 BAS Aerosol] 18 g 1    Sig: INHALE 2 PUFFS INTO THE LUNGS EVERY 6 (SIX) HOURS AS NEEDED FOR WHEEZING OR SHORTNESS OF BREATH (COUGH).     Pulmonology:  Beta Agonists Failed - 04/06/2020 11:12 AM      Failed - One inhaler should last at least one month. If the patient is requesting refills earlier, contact the patient to check for uncontrolled symptoms.      Passed - Valid encounter within last 12 months    Recent Outpatient Visits          2 weeks ago Benign paroxysmal positional vertigo, unspecified laterality   [place], Vernia Buff, NP   1 month ago [place], [place], [place]   4 months ago Benign paroxysmal positional vertigo, unspecified laterality   [place], [place] W, NP   10 months ago Benign paroxysmal positional vertigo, unspecified laterality   [place], Vernia Buff, NP   1 year ago Eczema, dyshidrotic   [place], Vernia Buff, NP      Future Appointments            In 1 month Pieter Partridge, [facility]   In 1 month Gildardo Pounds, NP [place]

## 2020-04-16 ENCOUNTER — Other Ambulatory Visit: Payer: Self-pay | Admitting: Family Medicine

## 2020-04-16 ENCOUNTER — Telehealth: Payer: Self-pay | Admitting: Nurse Practitioner

## 2020-04-16 DIAGNOSIS — H8110 Benign paroxysmal vertigo, unspecified ear: Secondary | ICD-10-CM

## 2020-04-16 MED ORDER — ONDANSETRON 8 MG PO TBDP: 8.0000 mg | ORAL_TABLET | Freq: Three times a day (TID) | ORAL | 1 refills | Status: DC | PRN

## 2020-04-16 MED FILL — ONDANSETRON ODT 8 MG TABLET: 8 | 10 days supply | Qty: 30 | Fill #1

## 2020-04-16 NOTE — Telephone Encounter (Signed)
Medication Refill - Medication: ondansetron (ZOFRAN ODT) 8 MG disintegrating tablet   Has the patient contacted their pharmacy? Yes.   (Agent: If no, request that the patient contact the pharmacy for the refill.) (Agent: If yes, when and what did the pharmacy advise?)  Preferred Pharmacy (with phone number or street name):  [facility], [address]  [place] [address]  Phone: [phone] Fax: [phone]     Agent: Please be advised that RX refills may take up to 3 business days. We ask that you follow-up with your pharmacy.

## 2020-04-16 NOTE — Telephone Encounter (Signed)
Requested medication (s) are due for refill today: yes  Requested medication (s) are on the active medication list: yes  Last refill:  03/20/2020  Future visit scheduled: yes  Notes to clinic:  this refill cannot be delegated    Requested Prescriptions  Pending Prescriptions Disp Refills   ondansetron (ZOFRAN ODT) 8 MG disintegrating tablet 30 tablet 1    Sig: Take 1 tablet (8 mg total) by mouth every 8 (eight) hours as needed for nausea or vomiting.      Not Delegated - Gastroenterology: Antiemetics Failed - 04/16/2020  2:04 PM      Failed - This refill cannot be delegated      Passed - Valid encounter within last 6 months    Recent Outpatient Visits           3 weeks ago Benign paroxysmal positional vertigo, unspecified laterality   [place], Vernia Buff, NP   1 month ago [place], [place], [place]   4 months ago Benign paroxysmal positional vertigo, unspecified laterality   [place], Vernia Buff, NP   10 months ago Benign paroxysmal positional vertigo, unspecified laterality   [place], Vernia Buff, NP   1 year ago Eczema, dyshidrotic   [place], Vernia Buff, NP       Future Appointments             In 3 weeks Pieter Partridge, DO [facility] [place]   In 1 month Gildardo Pounds, NP [place]

## 2020-05-07 NOTE — Progress Notes (Deleted)
NEUROLOGY CONSULTATION NOTE  Adrian [name] MRN: [id number] DOB: [birth year]  Referring provider: Freeman Caldron, PA-C Primary care provider: Geryl Rankins, NP  Reason for consult:  dizziness  HISTORY OF PRESENT ILLNESS: Adrian [name] is a 44 year old ***-handed male with migraines, heart murmur and asthma who presents for dizziness.  History supplemented by ED and PCP notes.   He has longstanding history of dizziness ***.  He was seen in the ED in May and August 2020 for this.  Meclizine is ineffective.    He has history of migraines since ***  Remote CT head and cervical spine without contrast on 08/12/2014 to assess near-syncope and bilateral hand tingling was personally reviewed and showed loss of lordosis in the cervical spine and no acute intracranial abnormality.    Current NSAIDS/analgesics:  *** Current triptans:  *** Current ergotamine:  *** Current anti-emetic:  Zofran ODT 8mg  Current muscle relaxants:  *** Current Antihypertensive medications:  *** Current Antidepressant medications:  *** Current Anticonvulsant medications:  *** Current anti-CGRP:  *** Current Vitamins/Herbal/Supplements:  *** Current Antihistamines/Decongestants:  Meclizine 25mg  TID PRN Other therapy:  *** Hormone/birth control:  *** Other medications:  ***  Past NSAIDS/analgesics:  Ibuprofen 800mg , naproxen, tramadol Past abortive triptans:  Sumatriptan 50mg  Past abortive ergotamine:  *** Past muscle relaxants:  *** Past anti-emetic:  Promethazine 25mg  Past antihypertensive medications:  *** Past antidepressant medications:  Nortriptyline 25mg  Past anticonvulsant medications:  Depakote 500mg  Past anti-CGRP:  *** Past vitamins/Herbal/Supplements:  *** Past antihistamines/decongestants:  Sudafed Other past therapies:  ***  Caffeine:  *** Alcohol:  *** Smoker:  *** Diet:  *** Exercise:  *** Depression:  ***; Anxiety:  *** Other pain:  *** Sleep hygiene:  *** Family history of  headache:  ***   PAST MEDICAL HISTORY: Past Medical History:  Diagnosis Date  . 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 Eczema   . Environmental allergies    grass  . Foot fracture, left   . Heart murmur   . Migraine   . Multiple food allergies    fish, tomatoes, peanuts, eggs, chicken and others    PAST SURGICAL HISTORY: Past Surgical History:  Procedure Laterality Date  . INGUINAL HERNIA REPAIR  1994    MEDICATIONS: Current Outpatient Medications on File Prior to Visit  Medication Sig Dispense Refill  . albuterol (VENTOLIN HFA) 108 (90 Base) MCG/ACT inhaler INHALE 2 PUFFS INTO THE LUNGS EVERY 6 (SIX) HOURS AS NEEDED FOR WHEEZING OR SHORTNESS OF BREATH (COUGH). 18 g 1  . chlorhexidine (PERIDEX) 0.12 % solution Use as directed 15 mLs in the mouth or throat 2 (two) times daily. 473 mL 0  . divalproex (DEPAKOTE) 500 MG DR tablet Take 1 tablet (500 mg total) by mouth daily for 30 days. (Patient not taking: Reported on 11/06/2018) 30 tablet 1  . fluticasone (FLOVENT HFA) 44 MCG/ACT inhaler Inhale 1 puff into the lungs 1 day or 1 dose for 1 dose. 10.6 g 3  . meclizine (ANTIVERT) 25 MG tablet Take 1 tablet (25 mg total) by mouth 3 (three) times daily. 30 tablet 2  . ondansetron (ZOFRAN ODT) 8 MG disintegrating tablet Take 1 tablet (8 mg total) by mouth every 8 (eight) hours as needed for nausea or vomiting. 30 tablet 1  . TRIAMCINOLONE PO Take by mouth.    . Vitamin D, Ergocalciferol, (DRISDOL) 1.25 MG (50000 UNIT) CAPS capsule Take 1 capsule (50,000 Units total) by mouth every 7 (seven) days. 12 capsule 1   No  current facility-administered medications on file prior to visit.    ALLERGIES: Allergies  Allergen Reactions  . Apple Shortness Of Breath, Itching, Nausea Only and Swelling    Tongue itches also  . Atrovent [Ipratropium] Anaphylaxis    Atrovent contains peanut oil and PT has a HX of anaphlaxis to peanuts  . Banana Shortness Of Breath and Itching    Makes tongue itch  . Carrot Oil  Shortness Of Breath and Itching  . Fish Allergy Anaphylaxis, Hives and Swelling  . Fruit & Vegetable Daily [Nutritional Supplements] Shortness Of Breath and Itching    Patient stated that he allergic to most all fruits  . Peanut-Containing Drug Products Anaphylaxis, Hives, Nausea And Vomiting and Swelling  . Shellfish Allergy Anaphylaxis, Hives, Swelling and Other (See Comments)    "ALL SEAFOOD"  . Tomato Anaphylaxis, Hives and Swelling  . Chicken Meat (Diagnostic) Other (See Comments)    Tested allergic to this years ago  . Latex Itching    No powdered gloves!!  . Pork-Derived Products Other (See Comments)    CAUSES SEVERE HEADACHES  . Soy Allergy Other (See Comments)    Tested allergic to this years ago  . Grass Extracts [Gramineae Pollens] Hives, Rash and Other (See Comments)    Wheezing also  . Tree Extract Other (See Comments)    Patient tested allergic to this years ago    FAMILY HISTORY: Family History  Problem Relation Age of Onset  . Asthma Father   . Diabetes Mother   . Hypertension Mother   . Clotting disorder Other        Aunt  . Breast cancer Other        Grandmother  . Prostate cancer Other        uncle  . Hyperlipidemia Other        grandparent  . Hypertension Other        grandparent/grandparent  . Kidney disease Other        aunt/uncle/grandparent  . Stroke Other        grandparent   ***.  SOCIAL HISTORY: Social History   Socioeconomic History  . Marital status: Single    Spouse name: Not on file  . Number of children: Not on file  . Years of education: Not on file  . Highest education level: Not on file  Occupational History  . Not on file  Tobacco Use  . Smoking status: Never Smoker  . Smokeless tobacco: Never Used  Vaping Use  . Vaping Use: Never used  Substance and Sexual Activity  . Alcohol use: Never  . Drug use: Yes    Types: Marijuana  . Sexual activity: Not on file  Other Topics Concern  . Not on file  Social History  Narrative   ** Merged History Encounter **       Currently unemployed   1 son   Incarcerated on drug charges in 2010 for ~1 year.    Social Determinants of Health   Financial Resource Strain:   . Difficulty of Paying Living Expenses: Not on file  Food Insecurity:   . Worried About Charity fundraiser in the Last Year: Not on file  . Ran Out of Food in the Last Year: Not on file  Transportation Needs:   . Lack of Transportation (Medical): Not on file  . Lack of Transportation (Non-Medical): Not on file  Physical Activity:   . Days of Exercise per Week: Not on file  . Minutes of Exercise  per Session: Not on file  Stress:   . Feeling of Stress : Not on file  Social Connections:   . Frequency of Communication with Friends and Family: Not on file  . Frequency of Social Gatherings with Friends and Family: Not on file  . Attends Religious Services: Not on file  . Active Member of Clubs or Organizations: Not on file  . Attends Archivist Meetings: Not on file  . Marital Status: Not on file  Intimate Partner Violence:   . Fear of Current or Ex-Partner: Not on file  . Emotionally Abused: Not on file  . Physically Abused: Not on file  . Sexually Abused: Not on file    REVIEW OF SYSTEMS: Constitutional: No fevers, chills, or sweats, no generalized fatigue, change in appetite Eyes: No visual changes, double vision, eye pain Ear, nose and throat: No hearing loss, ear pain, nasal congestion, sore throat Cardiovascular: No chest pain, palpitations Respiratory:  No shortness of breath at rest or with exertion, wheezes GastrointestinaI: No nausea, vomiting, diarrhea, abdominal pain, fecal incontinence Genitourinary:  No dysuria, urinary retention or frequency Musculoskeletal:  No neck pain, back pain Integumentary: No rash, pruritus, skin lesions Neurological: as above Psychiatric: No depression, insomnia, anxiety Endocrine: No palpitations, fatigue, diaphoresis, mood swings,  change in appetite, change in weight, increased thirst Hematologic/Lymphatic:  No purpura, petechiae. Allergic/Immunologic: no itchy/runny eyes, nasal congestion, recent allergic reactions, rashes  PHYSICAL EXAM: *** General: No acute distress.  Patient appears ***-groomed.  *** Head:  Normocephalic/atraumatic Eyes:  fundi examined but not visualized Neck: supple, no paraspinal tenderness, full range of motion Back: No paraspinal tenderness Heart: regular rate and rhythm Lungs: Clear to auscultation bilaterally. Vascular: No carotid bruits. Neurological Exam: Mental status: alert and oriented to person, place, and time, recent and remote memory intact, fund of knowledge intact, attention and concentration intact, speech fluent and not dysarthric, language intact. Cranial nerves: CN I: not tested CN II: pupils equal, round and reactive to light, visual fields intact CN III, IV, VI:  full range of motion, no nystagmus, no ptosis CN V: facial sensation intact CN VII: upper and lower face symmetric CN VIII: hearing intact CN IX, X: gag intact, uvula midline CN XI: sternocleidomastoid and trapezius muscles intact CN XII: tongue midline Bulk & Tone: normal, no fasciculations. Motor:  5/5 throughout *** Sensation:  Pinprick *** temperature *** and vibration sensation intact.  ***. Deep Tendon Reflexes:  2+ throughout, *** toes downgoing.  *** Finger to nose testing:  Without dysmetria.  *** Heel to shin:  Without dysmetria.  *** Gait:  Normal station and stride.  Able to turn and tandem walk. [place] ***.  IMPRESSION: ***  PLAN: ***  Thank you for allowing me to take part in the care of this patient.  Metta Clines, DO  CC:  Freeman Caldron, PA-C  Geryl Rankins, NP

## 2020-05-08 ENCOUNTER — Ambulatory Visit: Payer: Self-pay | Admitting: Neurology

## 2020-05-11 MED FILL — ALBUTEROL SULFATE HFA 108 (: 108 (90 BAS | 25 days supply | Qty: 18 | Fill #1

## 2020-05-11 MED FILL — ONDANSETRON ODT 8 MG TABLET: 8 | 10 days supply | Qty: 30 | Fill #0

## 2020-05-23 ENCOUNTER — Ambulatory Visit: Payer: Self-pay | Attending: Nurse Practitioner | Admitting: Nurse Practitioner

## 2020-05-23 ENCOUNTER — Other Ambulatory Visit: Payer: Self-pay

## 2020-06-01 ENCOUNTER — Ambulatory Visit: Payer: Self-pay | Admitting: Family

## 2020-06-01 ENCOUNTER — Ambulatory Visit: Payer: Self-pay | Admitting: Nurse Practitioner

## 2020-06-11 MED FILL — ONDANSETRON ODT 8 MG TABLET: 8 | 10 days supply | Qty: 30 | Fill #1

## 2020-06-11 MED FILL — hydrOXYzine HCL 25 MG TABS: 25 | 20 days supply | Qty: 60 | Fill #1

## 2020-07-16 ENCOUNTER — Telehealth: Payer: Self-pay | Admitting: Nurse Practitioner

## 2020-07-16 NOTE — Telephone Encounter (Signed)
Called patient and LVM letting him know that I was calling from [facility] in regards to a request I received from him and to call back [phone] to discuss further.   If patient calls back please advise patient that in order to have medical records released he does need to fill out a medical records request and sign it in order for records being released. Once the office re-opens he can stop by and fill out the request and we can release his records.

## 2020-07-16 NOTE — Telephone Encounter (Signed)
Cb- [phone] Patient is calling to request his medical records.

## 2020-07-23 MED FILL — VIT D2 1.25 MG (50,000 UNIT: 1.25 MG | 84 days supply | Qty: 12 | Fill #0

## 2020-07-25 ENCOUNTER — Ambulatory Visit: Payer: Self-pay

## 2020-07-25 DIAGNOSIS — H8110 Benign paroxysmal vertigo, unspecified ear: Secondary | ICD-10-CM

## 2020-07-25 NOTE — Telephone Encounter (Signed)
Spoke to patient, advised to f/u tomorrow with [facility], [employer] operations. Call was transferred to make an appt.

## 2020-07-25 NOTE — Telephone Encounter (Signed)
Patient called stating that he has been trying to get his medication Ondansetron refilled.  He states that he is in bad shape He started on Saturday having stomach pain and now nausea. He states he is dizzy but has vertigo. He states the only medication he has taken to help is the ondansetron. He has eaten some crackers today and states he is drinking fluids. He denies having an distended abdomin. He states his urine looks ok (normal) He denies blood in urine. He states he just needs this medication.  No appointment s are available with office He will possibly go to UC today for treatment. He is requesting refill on ondansetron which I will attach to note for office. He understands that I recommend evaluation of symptoms today.  Reason for Disposition . [1] MILD-MODERATE pain AND [2] constant AND [3] present > 2 hours  Answer Assessment - Initial Assessment Questions 1. DESCRIPTION: "Describe your dizziness."     Going to fall over 2. LIGHTHEADED: "Do you feel lightheaded?" (e.g., somewhat faint, woozy, weak upon standing)    woozy 3. VERTIGO: "Do you feel like either you or the room is spinning or tilting?" (i.e. vertigo)     no 4. SEVERITY: "How bad is it?"  "Do you feel like you are going to faint?" "Can you stand and walk?"   - MILD: Feels slightly dizzy, but walking normally.   - MODERATE: Feels very unsteady when walking, but not falling; interferes with normal activities (e.g., school, work) .   - SEVERE: Unable to walk without falling, or requires assistance to walk without falling; feels like passing out now.    Unable to stand 5. ONSET:  "When did the dizziness begin?"     Saturday 6. AGGRAVATING FACTORS: "Does anything make it worse?" (e.g., standing, change in head position)     Standing  7. HEART RATE: "Can you tell me your heart rate?" "How many beats in 15 seconds?"  (Note: not all patients can do this)      No 8. CAUSE: "What do you think is causing the dizziness?"    Lack of  medication 9. RECURRENT SYMPTOM: "Have you had dizziness before?" If Yes, ask: "When was the last time?" "What happened that time?"     *gave medication 10. OTHER SYMPTOMS: "Do you have any other symptoms?" (e.g., fever, chest pain, vomiting, diarrhea, bleeding)      Abdominal pain 11. PREGNANCY: "Is there any chance you are pregnant?" "When was your last menstrual period?"      N/A  Answer Assessment - Initial Assessment Questions 1. LOCATION: "Where does it hurt?"      Left side of umbilicus 2. RADIATION: "Does the pain shoot anywhere else?" (e.g., chest, back)    no 3. ONSET: "When did the pain begin?" (Minutes, hours or days ago)      Saturday 4. SUDDEN: "Gradual or sudden onset?"     sudden 5. PATTERN "Does the pain come and go, or is it constant?"    - If constant: "Is it getting better, staying the same, or worsening?"      (Note: Constant means the pain never goes away completely; most serious pain is constant and it progresses)     - If intermittent: "How long does it last?" "Do you have pain now?"     (Note: Intermittent means the pain goes away completely between bouts)     constant 6. SEVERITY: "How bad is the pain?"  (e.g., Scale 1-10; mild, moderate, or  severe)    - MILD (1-3): doesn't interfere with normal activities, abdomen soft and not tender to touch     - MODERATE (4-7): interferes with normal activities or awakens from sleep, tender to touch     - SEVERE (8-10): excruciating pain, doubled over, unable to do any normal activities       severe 7. RECURRENT SYMPTOM: "Have you ever had this type of stomach pain before?" If Yes, ask: "When was the last time?" and "What happened that time?"     yes 8. CAUSE: "What do you think is causing the stomach pain?"    Unsure  9. RELIEVING/AGGRAVATING FACTORS: "What makes it better or worse?" (e.g., movement, antacids, bowel movement)     Zofran 10. OTHER SYMPTOMS: "Has there been any vomiting, diarrhea, constipation, or urine  problems?"      none  Protocols used: ABDOMINAL PAIN - MALE-A-AH, DIZZINESS - LIGHTHEADEDNESS-A-AH

## 2020-07-25 NOTE — Telephone Encounter (Signed)
Patient has been scheduled for virtual visit on 07/26/20 at 9:30 am.

## 2020-07-26 ENCOUNTER — Telehealth (INDEPENDENT_AMBULATORY_CARE_PROVIDER_SITE_OTHER): Payer: Self-pay | Admitting: Physician Assistant

## 2020-07-26 ENCOUNTER — Other Ambulatory Visit: Payer: Self-pay

## 2020-07-26 ENCOUNTER — Other Ambulatory Visit: Payer: Self-pay | Admitting: Physician Assistant

## 2020-07-26 ENCOUNTER — Encounter: Payer: Self-pay | Admitting: Physician Assistant

## 2020-07-26 DIAGNOSIS — J4520 Mild intermittent asthma, uncomplicated: Secondary | ICD-10-CM

## 2020-07-26 DIAGNOSIS — R110 Nausea: Secondary | ICD-10-CM

## 2020-07-26 MED ORDER — ALBUTEROL SULFATE HFA 108 (90 BASE) MCG/ACT IN AERS: 2.0000 | INHALATION_SPRAY | Freq: Four times a day (QID) | RESPIRATORY_TRACT | 1 refills | Status: DC | PRN

## 2020-07-26 MED ORDER — ONDANSETRON 8 MG PO TBDP: 8.0000 mg | ORAL_TABLET | Freq: Three times a day (TID) | ORAL | 1 refills | Status: DC | PRN

## 2020-07-26 MED FILL — ALBUTEROL SULFATE HFA 108 (: 108 (90 BAS | 25 days supply | Qty: 18 | Fill #0

## 2020-07-26 MED FILL — ONDANSETRON ODT 8 MG TABLET: 8 | 10 days supply | Qty: 30 | Fill #0

## 2020-07-26 NOTE — Progress Notes (Signed)
Patient verified DOB Patient has not taken medication or eaten today. Patient complains of abdominal pain scaled at a 10. Patient describes pain as bloated on one side. Patient vomited last night. Patient denies diarrhea or fevers at home. Fever and sweats occurred a few days ago. Patient request inhaler and zofran refill.

## 2020-07-26 NOTE — Progress Notes (Signed)
Established Patient Office Visit  Subjective:  Patient ID: Adrian [name], male    DOB: 08/19/1975  Age: 45 y.o. MRN: [id number]  CC:  Chief Complaint  Patient presents with  . Abdominal Pain   Virtual Visit via Telephone Note  I connected with Adrian [name] on 07/26/20 at  9:30 AM EST by telephone and verified that I am speaking with the correct person using two identifiers.  Location: Patient: Home Provider: Primary Care at [facility]   I discussed the limitations, risks, security and privacy concerns of performing an evaluation and management service by telephone and the availability of in person appointments. I also discussed with the patient that there may be a patient responsible charge related to this service. The patient expressed understanding and agreed to proceed.   History of Present Illness:  Adrian [name] reports that he has been having ongoing abdominal pain, nausea and dizziness.  Reports that this has been present for "a long time".  Reports that he has previously been prescribed Zofran which offers him some relief.  Reports that he will have headaches that lead to dizziness and nausea which then resulted in the abdominal pain.  Reports that he has previously been referred to neurology but did not follow through.  Reports that the abdominal pain feels like bloating on his left side.  Reports occasionally it will result in vomiting.  Requests refill of inhaler    Observations/Objective: Medical history and current medications reviewed, no physical exam completed    Past Medical History:  Diagnosis Date  . 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 Eczema   . Environmental allergies    grass  . Foot fracture, left   . Heart murmur   . Migraine   . Multiple food allergies    fish, tomatoes, peanuts, eggs, chicken and others    Past Surgical History:  Procedure Laterality Date  . INGUINAL HERNIA REPAIR  1994    Family History  Problem Relation Age of Onset  . Asthma  Father   . Diabetes Mother   . Hypertension Mother   . Clotting disorder Other        Aunt  . Breast cancer Other        Grandmother  . Prostate cancer Other        uncle  . Hyperlipidemia Other        grandparent  . Hypertension Other        grandparent/grandparent  . Kidney disease Other        aunt/uncle/grandparent  . Stroke Other        grandparent    Social History   Socioeconomic History  . Marital status: Single    Spouse name: Not on file  . Number of children: Not on file  . Years of education: Not on file  . Highest education level: Not on file  Occupational History  . Not on file  Tobacco Use  . Smoking status: Never Smoker  . Smokeless tobacco: Never Used  Vaping Use  . Vaping Use: Never used  Substance and Sexual Activity  . Alcohol use: Never  . Drug use: Yes    Types: Marijuana  . Sexual activity: Yes  Other Topics Concern  . Not on file  Social History Narrative   ** Merged History Encounter **       Currently unemployed   1 son   Incarcerated on drug charges in 2010 for ~1 year.    Social Determinants of Health  Financial Resource Strain: Not on file  Food Insecurity: Not on file  Transportation Needs: Not on file  Physical Activity: Not on file  Stress: Not on file  Social Connections: Not on file  Intimate Partner Violence: Not on file    Outpatient Medications Prior to Visit  Medication Sig Dispense Refill  . chlorhexidine (PERIDEX) 0.12 % solution Use as directed 15 mLs in the mouth or throat 2 (two) times daily. 473 mL 0  . fluticasone (FLOVENT HFA) 44 MCG/ACT inhaler Inhale 1 puff into the lungs 1 day or 1 dose for 1 dose. 10.6 g 3  . TRIAMCINOLONE PO Take by mouth.    . Vitamin D, Ergocalciferol, (DRISDOL) 1.25 MG (50000 UNIT) CAPS capsule Take 1 capsule (50,000 Units total) by mouth every 7 (seven) days. 12 capsule 1  . albuterol (VENTOLIN HFA) 108 (90 Base) MCG/ACT inhaler INHALE 2 PUFFS INTO THE LUNGS EVERY 6 (SIX) HOURS AS  NEEDED FOR WHEEZING OR SHORTNESS OF BREATH (COUGH). 18 g 1  . ondansetron (ZOFRAN ODT) 8 MG disintegrating tablet Take 1 tablet (8 mg total) by mouth every 8 (eight) hours as needed for nausea or vomiting. 30 tablet 1  . meclizine (ANTIVERT) 25 MG tablet Take 1 tablet (25 mg total) by mouth 3 (three) times daily. (Patient not taking: Reported on 07/26/2020) 30 tablet 2  . divalproex (DEPAKOTE) 500 MG DR tablet Take 1 tablet (500 mg total) by mouth daily for 30 days. (Patient not taking: Reported on 11/06/2018) 30 tablet 1   No facility-administered medications prior to visit.    Allergies  Allergen Reactions  . Apple Shortness Of Breath, Itching, Nausea Only and Swelling    Tongue itches also  . Atrovent [Ipratropium] Anaphylaxis    Atrovent contains peanut oil and PT has a HX of anaphlaxis to peanuts  . Banana Shortness Of Breath and Itching    Makes tongue itch  . Carrot Oil Shortness Of Breath and Itching  . Fish Allergy Anaphylaxis, Hives and Swelling  . Fruit & Vegetable Daily [Nutritional Supplements] Shortness Of Breath and Itching    Patient stated that he allergic to most all fruits  . Peanut-Containing Drug Products Anaphylaxis, Hives, Nausea And Vomiting and Swelling  . Shellfish Allergy Anaphylaxis, Hives, Swelling and Other (See Comments)    "ALL SEAFOOD"  . Tomato Anaphylaxis, Hives and Swelling  . Chicken Meat (Diagnostic) Other (See Comments)    Tested allergic to this years ago  . Latex Itching    No powdered gloves!!  . Pork-Derived Products Other (See Comments)    CAUSES SEVERE HEADACHES  . Soy Allergy Other (See Comments)    Tested allergic to this years ago  . Grass Extracts [Gramineae Pollens] Hives, Rash and Other (See Comments)    Wheezing also  . Tree Extract Other (See Comments)    Patient tested allergic to this years ago    ROS Review of Systems  Constitutional: Negative for chills and fever.  HENT: Negative.   Eyes: Negative.   Respiratory:  Negative for cough, shortness of breath and wheezing.   Cardiovascular: Negative for chest pain.  Gastrointestinal: Positive for abdominal pain, nausea and vomiting.  Endocrine: Negative.   Genitourinary: Negative.   Musculoskeletal: Negative.   Skin: Negative.   Allergic/Immunologic: Negative.   Neurological: Positive for dizziness and headaches.  Hematological: Negative.   Psychiatric/Behavioral: Negative.       Objective:     There were no vitals taken for this visit. Wt Readings from Last  3 Encounters:  03/20/20 226 lb (102.5 kg)  09/03/19 200 lb (90.7 kg)  11/06/18 200 lb (90.7 kg)     Health Maintenance Due  Topic Date Due  . COVID-19 Vaccine (1) Never done    There are no preventive care reminders to display for this patient.  Lab Results  Component Value Date   TSH 1.780 06/10/2019   Lab Results  Component Value Date   WBC 5.9 03/20/2020   HGB 13.4 03/20/2020   HCT 41.2 03/20/2020   MCV 83 03/20/2020   PLT 240 03/20/2020   Lab Results  Component Value Date   NA 142 03/20/2020   K 4.1 03/20/2020   CO2 26 03/20/2020   GLUCOSE 120 (H) 03/20/2020   BUN 6 03/20/2020   CREATININE 1.23 03/20/2020   BILITOT 0.3 03/20/2020   ALKPHOS 71 03/20/2020   AST 15 03/20/2020   ALT 16 03/20/2020   PROT 7.1 03/20/2020   ALBUMIN 4.1 03/20/2020   CALCIUM 9.3 03/20/2020   ANIONGAP 7 02/08/2019   Lab Results  Component Value Date   CHOL 125 03/20/2020   Lab Results  Component Value Date   HDL 32 (L) 03/20/2020   Lab Results  Component Value Date   LDLCALC 78 03/20/2020   Lab Results  Component Value Date   TRIG 72 03/20/2020   Lab Results  Component Value Date   CHOLHDL 3.9 03/20/2020   Lab Results  Component Value Date   HGBA1C 5.5 03/20/2020      Assessment & Plan:   Problem List Items Addressed This Visit   None   Visit Diagnoses    Nausea       Relevant Medications   ondansetron (ZOFRAN ODT) 8 MG disintegrating tablet   Asthma in  adult, mild intermittent, uncomplicated  (Chronic)      Relevant Medications   albuterol (VENTOLIN HFA) 108 (90 Base) MCG/ACT inhaler      Assessment and Plan:   1. Nausea Refill Zofran, patient encouraged to resume application for [place] financial assistance, patient to follow-up in 1 week with mobile medicine unit. - ondansetron (ZOFRAN ODT) 8 MG disintegrating tablet; Take 1 tablet (8 mg total) by mouth every 8 (eight) hours as needed for nausea or vomiting.  Dispense: 30 tablet; Refill: 1  2. Asthma in adult, mild intermittent, uncomplicated [place] refill given - albuterol (VENTOLIN HFA) 108 (90 Base) MCG/ACT inhaler; Inhale 2 puffs into the lungs every 6 (six) hours as needed for wheezing or shortness of breath (cough).  Dispense: 18 g; Refill: 1  Follow Up Instructions:    I discussed the assessment and treatment plan with the patient. The patient was provided an opportunity to ask questions and all were answered. The patient agreed with the plan and demonstrated an understanding of the instructions.   The patient was advised to call back or seek an in-person evaluation if the symptoms worsen or if the condition fails to improve as anticipated.  I provided 21 minutes of non-face-to-face time during this encounter.      Meds ordered this encounter  Medications  . ondansetron (ZOFRAN ODT) 8 MG disintegrating tablet    Sig: Take 1 tablet (8 mg total) by mouth every 8 (eight) hours as needed for nausea or vomiting.    Dispense:  30 tablet    Refill:  1    Order Specific Question:   Supervising Provider    Answer:   Delford Field, PATRICK E [[id number]]  . albuterol (VENTOLIN HFA) 108 (  90 Base) MCG/ACT inhaler    Sig: Inhale 2 puffs into the lungs every 6 (six) hours as needed for wheezing or shortness of breath (cough).    Dispense:  18 g    Refill:  1    Order Specific Question:   Supervising Provider    Answer:   Storm Frisk [[id number]]   No AVS created, patient declines my  chart   follow-up: Return in about 1 week (around 08/02/2020).    Kasandra Knudsen Mayers, PA-C

## 2020-07-27 ENCOUNTER — Encounter: Payer: Self-pay | Admitting: Physician Assistant

## 2020-07-27 DIAGNOSIS — R110 Nausea: Secondary | ICD-10-CM | POA: Insufficient documentation

## 2020-07-27 DIAGNOSIS — J4520 Mild intermittent asthma, uncomplicated: Secondary | ICD-10-CM | POA: Insufficient documentation

## 2020-08-02 ENCOUNTER — Encounter: Payer: Self-pay | Admitting: Physician Assistant

## 2020-08-02 ENCOUNTER — Other Ambulatory Visit: Payer: Self-pay

## 2020-08-02 ENCOUNTER — Other Ambulatory Visit: Payer: Self-pay | Admitting: Physician Assistant

## 2020-08-02 ENCOUNTER — Ambulatory Visit (INDEPENDENT_AMBULATORY_CARE_PROVIDER_SITE_OTHER): Payer: Self-pay | Admitting: Physician Assistant

## 2020-08-02 VITALS — BP 128/72 | HR 60 | Temp 97.4°F | Resp 18 | Ht 71.0 in | Wt 241.0 lb

## 2020-08-02 DIAGNOSIS — F411 Generalized anxiety disorder: Secondary | ICD-10-CM

## 2020-08-02 DIAGNOSIS — R110 Nausea: Secondary | ICD-10-CM

## 2020-08-02 DIAGNOSIS — K219 Gastro-esophageal reflux disease without esophagitis: Secondary | ICD-10-CM

## 2020-08-02 DIAGNOSIS — R1013 Epigastric pain: Secondary | ICD-10-CM

## 2020-08-02 DIAGNOSIS — R001 Bradycardia, unspecified: Secondary | ICD-10-CM

## 2020-08-02 MED ORDER — ONDANSETRON 8 MG PO TBDP: 8.0000 mg | ORAL_TABLET | Freq: Three times a day (TID) | ORAL | 1 refills | Status: DC | PRN

## 2020-08-02 MED ORDER — HYDROXYZINE HCL 25 MG PO TABS: ORAL_TABLET | ORAL | 1 refills | Status: DC

## 2020-08-02 MED ORDER — PANTOPRAZOLE SODIUM 40 MG PO TBEC: 40.0000 mg | DELAYED_RELEASE_TABLET | Freq: Every day | ORAL | 3 refills | Status: DC

## 2020-08-02 MED FILL — ONDANSETRON ODT 8 MG TABLET: 8 | 10 days supply | Qty: 30 | Fill #0

## 2020-08-02 MED FILL — hydrOXYzine HCL 25 MG TABS: 25 | 20 days supply | Qty: 60 | Fill #0

## 2020-08-02 MED FILL — PANTOPRAZOLE SOD DR 40 MG T: 40 | 30 days supply | Qty: 30 | Fill #0

## 2020-08-02 NOTE — Patient Instructions (Addendum)
You will take Protonix once a day in the morning, 20 minutes before anything to eat.  I would like you to use 1/2-1 full tablet of the hydroxyzine 3 times a day to help you with anxiety.  You can use the higher dose at bedtime to help you with insomnia.  Continue taking the vitamin D once a week, continue using the Zofran and meclizine as needed.   Kennieth Rad, PA-C Physician Assistant [facility] [email or web address]    Food Choices for Gastroesophageal Reflux Disease, Adult When you have gastroesophageal reflux disease (GERD), the foods you eat and your eating habits are very important. Choosing the right foods can help ease the discomfort of GERD. Consider working with a dietitian to help you make healthy food choices. What are tips for following this plan? Reading food labels  Look for foods that are low in saturated fat. Foods that have less than 5% of daily value (DV) of fat and 0 g of trans fats may help with your symptoms. Cooking  Cook foods using methods other than frying. This may include baking, steaming, grilling, or broiling. These are all methods that do not need a lot of fat for cooking.  To add flavor, try to use herbs that are low in spice and acidity. Meal planning  Choose healthy foods that are low in fat, such as fruits, vegetables, whole grains, low-fat dairy products, lean meats, fish, and poultry.  Eat frequent, small meals instead of three large meals each day. Eat your meals slowly, in a relaxed setting. Avoid bending over or lying down until 2-3 hours after eating.  Limit high-fat foods such as fatty meats or fried foods.  Limit your intake of fatty foods, such as oils, butter, and shortening.  Avoid the following as told by your health care provider: ? Foods that cause symptoms. These may be different for different people. Keep a food diary to keep track of foods that cause  symptoms. ? Alcohol. ? Drinking large amounts of liquid with meals. ? Eating meals during the 2-3 hours before bed.   Lifestyle  Maintain a healthy weight. Ask your health care provider what weight is healthy for you. If you need to lose weight, work with your health care provider to do so safely.  Exercise for at least 30 minutes on 5 or more days each week, or as told by your health care provider.  Avoid wearing clothes that fit tightly around your waist and chest.  Do not use any products that contain nicotine or tobacco. These products include cigarettes, chewing tobacco, and vaping devices, such as e-cigarettes. If you need help quitting, ask your health care provider.  Sleep with the head of your bed raised. Use a wedge under the mattress or blocks under the bed frame to raise the head of the bed.  Chew sugar-free gum after mealtimes. What foods should I eat? Eat a healthy, well-balanced diet of fruits, vegetables, whole grains, low-fat dairy products, lean meats, fish, and poultry. Each person is different. Foods that may trigger symptoms in one person may not trigger any symptoms in another person. Work with your health care provider to identify foods that are safe for you. The items listed above may not be a complete list of recommended foods and beverages. Contact a dietitian for more information.   What foods should I avoid? Limiting some of these foods may help manage the symptoms of GERD. Everyone is different. Consult a dietitian or your health care  provider to help you identify the exact foods to avoid, if any. Fruits Any fruits prepared with added fat. Any fruits that cause symptoms. For some people this may include citrus fruits, such as oranges, grapefruit, pineapple, and lemons. Vegetables Deep-fried vegetables. [place] fries. Any vegetables prepared with added fat. Any vegetables that cause symptoms. For some people, this may include tomatoes and tomato products, chili  peppers, onions and garlic, and horseradish. Grains Pastries or quick breads with added fat. Meats and other proteins High-fat meats, such as fatty beef or pork, hot dogs, ribs, ham, sausage, salami, and bacon. Fried meat or protein, including fried fish and fried chicken. Nuts and nut butters, in large amounts. Dairy Whole milk and chocolate milk. Sour cream. Cream. Ice cream. Cream cheese. Milkshakes. Fats and oils Butter. Margarine. Shortening. Ghee. Beverages Coffee and tea, with or without caffeine. Carbonated beverages. Sodas. Energy drinks. Fruit juice made with acidic fruits, such as orange or grapefruit. Tomato juice. Alcoholic drinks. Sweets and desserts Chocolate and cocoa. Donuts. Seasonings and condiments Pepper. Peppermint and spearmint. Added salt. Any condiments, herbs, or seasonings that cause symptoms. For some people, this may include curry, hot sauce, or vinegar-based salad dressings. The items listed above may not be a complete list of foods and beverages to avoid. Contact a dietitian for more information. Questions to ask your health care provider Diet and lifestyle changes are usually the first steps that are taken to manage symptoms of GERD. If diet and lifestyle changes do not improve your symptoms, talk with your health care provider about taking medicines. Where to find more information  International Foundation for Gastrointestinal Disorders: aboutgerd.org Summary  When you have gastroesophageal reflux disease (GERD), food and lifestyle choices may be very helpful in easing the discomfort of GERD.  Eat frequent, small meals instead of three large meals each day. Eat your meals slowly, in a relaxed setting. Avoid bending over or lying down until 2-3 hours after eating.  Limit high-fat foods such as fatty meats or fried foods. This information is not intended to replace advice given to you by your health care provider. Make sure you discuss any questions you have  with your health care provider. Document Revised: 12/26/2019 Document Reviewed: 12/26/2019 Elsevier Patient Education  [place].

## 2020-08-02 NOTE — Progress Notes (Signed)
Established Patient Office Visit  Subjective:  Patient ID: Adrian [name], male    DOB: [birth year]  Age: 45 y.o. MRN: [id number]  CC:  Chief Complaint  Patient presents with  . Abdominal Pain    HPI Adrian [name] reports that his abdominal pain nausea and dizziness continues despite restarting the Zofran and meclizine.  Reports that he has heartburn and acid reflux several times throughout the day, states that he has previously used Tums without any relief.  Reports epigastric pain before and after eating.  Denies NSAID use  Reports that his anxiety has been elevated, was previously prescribed hydroxyzine and recently started a trial of it.  Reports that 25 mg does seem to offer some relief but feels he has an adverse effect of a hot feeling when he has a bowel movement.  Reports that he has difficulty falling asleep and staying asleep, has used marijuana with some relief, has failed Tylenol PM.  Reports that he has difficulty eating and drinking because of his abdominal pain.  Reports that his stomach becomes more upset when he becomes anxious or upset  Reports that he just started the vitamin D prescription last week.  Past Medical History:  Diagnosis Date  . 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 Eczema   . Environmental allergies    grass  . Foot fracture, left   . Heart murmur   . Migraine   . Multiple food allergies    fish, tomatoes, peanuts, eggs, chicken and others    Past Surgical History:  Procedure Laterality Date  . INGUINAL HERNIA REPAIR  1994    Family History  Problem Relation Age of Onset  . Asthma Father   . Diabetes Mother   . Hypertension Mother   . Clotting disorder Other        Aunt  . Breast cancer Other        Grandmother  . Prostate cancer Other        uncle  . Hyperlipidemia Other        grandparent  . Hypertension Other        grandparent/grandparent  . Kidney disease Other        aunt/uncle/grandparent  . Stroke Other        grandparent     Social History   Socioeconomic History  . Marital status: Single    Spouse name: Not on file  . Number of children: Not on file  . Years of education: Not on file  . Highest education level: Not on file  Occupational History  . Not on file  Tobacco Use  . Smoking status: Never Smoker  . Smokeless tobacco: Never Used  Vaping Use  . Vaping Use: Never used  Substance and Sexual Activity  . Alcohol use: Never  . Drug use: Yes    Types: Marijuana  . Sexual activity: Yes  Other Topics Concern  . Not on file  Social History Narrative   ** Merged History Encounter **       Currently unemployed   1 son   Incarcerated on drug charges in 2010 for ~1 year.    Social Determinants of Health   Financial Resource Strain: Not on file  Food Insecurity: Not on file  Transportation Needs: Not on file  Physical Activity: Not on file  Stress: Not on file  Social Connections: Not on file  Intimate Partner Violence: Not on file    Outpatient Medications Prior to Visit  Medication Sig Dispense  Refill  . albuterol (VENTOLIN HFA) 108 (90 Base) MCG/ACT inhaler Inhale 2 puffs into the lungs every 6 (six) hours as needed for wheezing or shortness of breath (cough). 18 g 1  . fluticasone (FLOVENT HFA) 44 MCG/ACT inhaler Inhale 1 puff into the lungs 1 day or 1 dose for 1 dose. 10.6 g 3  . meclizine (ANTIVERT) 25 MG tablet Take 1 tablet (25 mg total) by mouth 3 (three) times daily. 30 tablet 2  . TRIAMCINOLONE PO Take by mouth.    . Vitamin D, Ergocalciferol, (DRISDOL) 1.25 MG (50000 UNIT) CAPS capsule Take 1 capsule (50,000 Units total) by mouth every 7 (seven) days. 12 capsule 1  . ondansetron (ZOFRAN ODT) 8 MG disintegrating tablet Take 1 tablet (8 mg total) by mouth every 8 (eight) hours as needed for nausea or vomiting. 30 tablet 1  . chlorhexidine (PERIDEX) 0.12 % solution Use as directed 15 mLs in the mouth or throat 2 (two) times daily. 473 mL 0  . hydrOXYzine (ATARAX/VISTARIL) 25 MG  tablet Take 25 mg by mouth 3 (three) times daily as needed.     No facility-administered medications prior to visit.    Allergies  Allergen Reactions  . Apple Shortness Of Breath, Itching, Nausea Only and Swelling    Tongue itches also  . Atrovent [Ipratropium] Anaphylaxis    Atrovent contains peanut oil and PT has a HX of anaphlaxis to peanuts  . Banana Shortness Of Breath and Itching    Makes tongue itch  . Carrot Oil Shortness Of Breath and Itching  . Fish Allergy Anaphylaxis, Hives and Swelling  . Fruit & Vegetable Daily [Nutritional Supplements] Shortness Of Breath and Itching    Patient stated that he allergic to most all fruits  . Peanut-Containing Drug Products Anaphylaxis, Hives, Nausea And Vomiting and Swelling  . Shellfish Allergy Anaphylaxis, Hives, Swelling and Other (See Comments)    "ALL SEAFOOD"  . Tomato Anaphylaxis, Hives and Swelling  . Chicken Meat (Diagnostic) Other (See Comments)    Tested allergic to this years ago  . Latex Itching    No powdered gloves!!  . Pork-Derived Products Other (See Comments)    CAUSES SEVERE HEADACHES  . Soy Allergy Other (See Comments)    Tested allergic to this years ago  . Grass Extracts [Gramineae Pollens] Hives, Rash and Other (See Comments)    Wheezing also  . Tree Extract Other (See Comments)    Patient tested allergic to this years ago    ROS Review of Systems  Constitutional: Negative for chills and fever.  HENT: Negative.   Eyes: Negative.   Respiratory: Negative.   Cardiovascular: Negative.   Gastrointestinal: Positive for abdominal pain, nausea and vomiting.  Endocrine: Negative.   Genitourinary: Negative.   Musculoskeletal: Negative.   Skin: Negative.   Allergic/Immunologic: Negative.   Neurological: Positive for dizziness.  Hematological: Negative.   Psychiatric/Behavioral: Positive for sleep disturbance. Negative for self-injury and suicidal ideas. The patient is nervous/anxious.       Objective:     Physical Exam Vitals and nursing note reviewed.  Constitutional:      General: He is not in acute distress.    Appearance: He is well-developed. He is not ill-appearing.  HENT:     Head: Normocephalic and atraumatic.     Mouth/Throat:     Mouth: Mucous membranes are moist.     Pharynx: Oropharynx is clear.  Eyes:     Extraocular Movements: Extraocular movements intact.     Pupils:  Pupils are equal, round, and reactive to light.  Cardiovascular:     Rate and Rhythm: Normal rate and regular rhythm.     Heart sounds: Normal heart sounds.  Pulmonary:     Effort: Pulmonary effort is normal.     Breath sounds: Normal breath sounds.  Abdominal:     General: Abdomen is flat.     Palpations: Abdomen is soft.     Tenderness: There is abdominal tenderness in the epigastric area.  Skin:    General: Skin is warm.  Neurological:     General: No focal deficit present.     Mental Status: He is alert and oriented to person, place, and time.  Psychiatric:        Mood and Affect: Mood normal.        Behavior: Behavior normal.     BP 128/72 (BP Location: Left Arm, Patient Position: Sitting, Cuff Size: Normal)   Pulse 60   Temp (!) 97.4 F (36.3 C) (Oral)   Resp 18   Ht 5\' 11"  (1.803 m)   Wt 241 lb (109.3 kg)   SpO2 98%   BMI 33.61 kg/m  Wt Readings from Last 3 Encounters:  08/02/20 241 lb (109.3 kg)  03/20/20 226 lb (102.5 kg)  09/03/19 200 lb (90.7 kg)     Health Maintenance Due  Topic Date Due  . COVID-19 Vaccine (1) Never done    There are no preventive care reminders to display for this patient.  Lab Results  Component Value Date   TSH 0.856 08/02/2020   Lab Results  Component Value Date   WBC 5.5 08/02/2020   HGB 14.3 08/02/2020   HCT 43.5 08/02/2020   MCV 81 08/02/2020   PLT 297 08/02/2020   Lab Results  Component Value Date   NA 139 08/02/2020   K 4.3 08/02/2020   CO2 26 03/20/2020   GLUCOSE 89 08/02/2020   BUN 11 08/02/2020   CREATININE 1.38 (H)  08/02/2020   BILITOT 0.3 08/02/2020   ALKPHOS 70 08/02/2020   AST 15 08/02/2020   ALT 16 03/20/2020   PROT 7.3 08/02/2020   ALBUMIN 4.1 08/02/2020   CALCIUM 9.1 08/02/2020   ANIONGAP 7 02/08/2019   Lab Results  Component Value Date   CHOL 125 03/20/2020   Lab Results  Component Value Date   HDL 32 (L) 03/20/2020   Lab Results  Component Value Date   LDLCALC 78 03/20/2020   Lab Results  Component Value Date   TRIG 72 03/20/2020   Lab Results  Component Value Date   CHOLHDL 3.9 03/20/2020   Lab Results  Component Value Date   HGBA1C 5.5 03/20/2020      Assessment & Plan:   Problem List Items Addressed This Visit      Digestive   Gastroesophageal reflux disease without esophagitis   Relevant Medications   pantoprazole (PROTONIX) 40 MG tablet   ondansetron (ZOFRAN ODT) 8 MG disintegrating tablet   Other Relevant Orders   H Pylori, IGM, IGG, IGA AB (Completed)     Other   Nausea - Primary   Relevant Medications   ondansetron (ZOFRAN ODT) 8 MG disintegrating tablet   Other Relevant Orders   H Pylori, IGM, IGG, IGA AB (Completed)   Epigastric pain   Relevant Medications   pantoprazole (PROTONIX) 40 MG tablet   Other Relevant Orders   H Pylori, IGM, IGG, IGA AB (Completed)   Bradycardia   Relevant Orders   CBC with Differential/Platelet (  Completed)   Comp. Metabolic Panel (12) (Completed)   TSH (Completed)   Anxiety state   Relevant Medications   hydrOXYzine (ATARAX/VISTARIL) 25 MG tablet    1. Nausea Continue Zofran as needed - H Pylori, IGM, IGG, IGA AB - ondansetron (ZOFRAN ODT) 8 MG disintegrating tablet; Take 1 tablet (8 mg total) by mouth every 8 (eight) hours as needed for nausea or vomiting.  Dispense: 30 tablet; Refill: 1  2. Epigastric pain Trial Protonix, patient education given on GERD lifestyle modifications - H Pylori, IGM, IGG, IGA AB - pantoprazole (PROTONIX) 40 MG tablet; Take 1 tablet (40 mg total) by mouth daily.  Dispense: 30  tablet; Refill: 3  3. Bradycardia Encouraged increased hydration - CBC with Differential/Platelet - Comp. Metabolic Panel (12) - TSH  4. Anxiety state Encouraged patient to use hydroxyzine 1/2 tablet 3 times a day to help keep anxiety levels low, 25 mg as needed for insomnia - hydrOXYzine (ATARAX/VISTARIL) 25 MG tablet; Take 1/2 - 1 full tab q8hrs prn for anxiety  Dispense: 60 tablet; Refill: 1  5. Gastroesophageal reflux disease without esophagitis  - H Pylori, IGM, IGG, IGA AB - pantoprazole (PROTONIX) 40 MG tablet; Take 1 tablet (40 mg total) by mouth daily.  Dispense: 30 tablet; Refill: 3   I have reviewed the patient's medical history (PMH, PSH, Social History, Family History, Medications, and allergies) , and have been updated if relevant. I spent 30 minutes reviewing chart and  face to face time with patient.     Meds ordered this encounter  Medications  . pantoprazole (PROTONIX) 40 MG tablet    Sig: Take 1 tablet (40 mg total) by mouth daily.    Dispense:  30 tablet    Refill:  3    Order Specific Question:   Supervising Provider    Answer:   Joya Gaskins, PATRICK E [[id number]]  . ondansetron (ZOFRAN ODT) 8 MG disintegrating tablet    Sig: Take 1 tablet (8 mg total) by mouth every 8 (eight) hours as needed for nausea or vomiting.    Dispense:  30 tablet    Refill:  1    Order Specific Question:   Supervising Provider    Answer:   Joya Gaskins, PATRICK E [[id number]]  . hydrOXYzine (ATARAX/VISTARIL) 25 MG tablet    Sig: Take 1/2 - 1 full tab q8hrs prn for anxiety    Dispense:  60 tablet    Refill:  1    Order Specific Question:   Supervising Provider    Answer:   Elsie Stain [[id number]]    Follow-up: Return in about 1 week (around 08/09/2020).    Loraine Grip Sheina Mcleish, PA-C

## 2020-08-02 NOTE — Progress Notes (Signed)
Patient has eaten today and patient has taken medication. Patient complains of Antivert not helping along with zofran Patient needs a refill on inhalers. Patient shares he has intermittent chest pains and has been told he has bradycardia in the past. Patient reports abdominal pain at a 10 currently described as upset and uncomfortable.

## 2020-08-03 LAB — H PYLORI, IGM, IGG, IGA AB
H pylori, IgM Abs: <9 units (ref 0.0–8.9)
H. pylori, IgA Abs: <9 units (ref 0.0–8.9)
H. pylori, IgG AbS: 0.22 Index Value (ref 0.00–0.79)

## 2020-08-03 LAB — COMP. METABOLIC PANEL (12)
AST: 15 IU/L (ref 0–40)
Albumin/Globulin Ratio: 1.3 (ref 1.2–2.2)
Albumin: 4.1 g/dL (ref 4.0–5.0)
Alkaline Phosphatase: 70 IU/L (ref 44–121)
BUN/Creatinine Ratio: 8 — ABNORMAL LOW (ref 9–20)
BUN: 11 mg/dL (ref 6–24)
Bilirubin Total: 0.3 mg/dL (ref 0.0–1.2)
Calcium: 9.1 mg/dL (ref 8.7–10.2)
Chloride: 101 mmol/L (ref 96–106)
Creatinine, Ser: 1.38 mg/dL — ABNORMAL HIGH (ref 0.76–1.27)
GFR calc Af Amer: 71 mL/min/{1.73_m2} (ref >59)
GFR calc non Af Amer: 62 mL/min/{1.73_m2} (ref >59)
Globulin, Total: 3.2 g/dL (ref 1.5–4.5)
Glucose: 89 mg/dL (ref 65–99)
Potassium: 4.3 mmol/L (ref 3.5–5.2)
Sodium: 139 mmol/L (ref 134–144)
Total Protein: 7.3 g/dL (ref 6.0–8.5)

## 2020-08-03 LAB — CBC WITH DIFFERENTIAL/PLATELET
Basophils Absolute: 0 10*3/uL (ref 0.0–0.2)
Basos: 0 %
EOS (ABSOLUTE): 0.1 10*3/uL (ref 0.0–0.4)
Eos: 1 %
Hematocrit: 43.5 % (ref 37.5–51.0)
Hemoglobin: 14.3 g/dL (ref 13.0–17.7)
Immature Grans (Abs): 0 10*3/uL (ref 0.0–0.1)
Immature Granulocytes: 0 %
Lymphocytes Absolute: 2.4 10*3/uL (ref 0.7–3.1)
Lymphs: 44 %
MCH: 26.6 pg (ref 26.6–33.0)
MCHC: 32.9 g/dL (ref 31.5–35.7)
MCV: 81 fL (ref 79–97)
Monocytes Absolute: 0.4 10*3/uL (ref 0.1–0.9)
Monocytes: 8 %
Neutrophils Absolute: 2.6 10*3/uL (ref 1.4–7.0)
Neutrophils: 47 %
Platelets: 297 10*3/uL (ref 150–450)
RBC: 5.37 x10E6/uL (ref 4.14–5.80)
RDW: 13.4 % (ref 11.6–15.4)
WBC: 5.5 10*3/uL (ref 3.4–10.8)

## 2020-08-03 LAB — TSH: TSH: 0.856 u[IU]/mL (ref 0.450–4.500)

## 2020-08-04 DIAGNOSIS — R1013 Epigastric pain: Secondary | ICD-10-CM | POA: Insufficient documentation

## 2020-08-04 DIAGNOSIS — K219 Gastro-esophageal reflux disease without esophagitis: Secondary | ICD-10-CM | POA: Insufficient documentation

## 2020-08-04 DIAGNOSIS — R001 Bradycardia, unspecified: Secondary | ICD-10-CM | POA: Insufficient documentation

## 2020-08-04 DIAGNOSIS — F411 Generalized anxiety disorder: Secondary | ICD-10-CM | POA: Insufficient documentation

## 2020-08-04 NOTE — Addendum Note (Signed)
Addended by: Kennieth Rad on: 08/04/2020 01:14 PM   Modules accepted: Orders

## 2020-08-07 ENCOUNTER — Telehealth: Payer: Self-pay | Admitting: *Deleted

## 2020-08-07 NOTE — Telephone Encounter (Signed)
-----   Message from Kennieth Rad, [place] sent at 08/04/2020  1:14 PM EST ----- Please call patient and let him know that his kidney and liver function as well as his thyroid function are within normal limits.  He does show signs of dehydration.  He was negative for H. pylori, I do still want him to take the Protonix once a day until her next office visit.  I am going to order ultrasound of his abdomen for further review.

## 2020-08-07 NOTE — Telephone Encounter (Signed)
Medical Assistant left message on patient's home and cell voicemail. Voicemail states to give a call back to Royal Beirne with [facility] at [phone]. 

## 2020-08-09 ENCOUNTER — Ambulatory Visit: Payer: Self-pay | Admitting: Physician Assistant

## 2020-08-15 ENCOUNTER — Ambulatory Visit: Payer: Self-pay

## 2020-09-05 MED FILL — hydrOXYzine HCL 25 MG TABS: 25 | 20 days supply | Qty: 60 | Fill #1

## 2020-09-05 MED FILL — ONDANSETRON ODT 8 MG TABLET: 8 | 10 days supply | Qty: 30 | Fill #1

## 2020-09-05 MED FILL — PANTOPRAZOLE SOD DR 40 MG T: 40 | 30 days supply | Qty: 30 | Fill #1

## 2020-09-29 ENCOUNTER — Other Ambulatory Visit: Payer: Self-pay

## 2020-10-01 ENCOUNTER — Other Ambulatory Visit: Payer: Self-pay

## 2020-10-01 MED FILL — Albuterol Sulfate Inhal Aero 108 MCG/ACT (90MCG Base Equiv): RESPIRATORY_TRACT | 25 days supply | Qty: 18 | Fill #0 | Status: AC

## 2020-10-02 ENCOUNTER — Other Ambulatory Visit: Payer: Self-pay

## 2020-10-02 ENCOUNTER — Other Ambulatory Visit: Payer: Self-pay | Admitting: Nurse Practitioner

## 2020-10-02 DIAGNOSIS — R110 Nausea: Secondary | ICD-10-CM

## 2020-10-02 NOTE — Telephone Encounter (Signed)
Copied from [place] [id number]. Topic: General - Other >> Oct 02, 2020 10:30 AM Keene Breath wrote: Reason for CRM: Patient would like the nurse to call him regarding his medication.  He is trying to get a refill but is not sure of which medication he needs.  Please call patient to discuss at [phone]

## 2020-10-02 NOTE — Telephone Encounter (Signed)
Returned pt call. Pt states he is needing zofran refilled and would like rx sent to [facility]

## 2020-10-04 ENCOUNTER — Other Ambulatory Visit: Payer: Self-pay

## 2020-10-04 ENCOUNTER — Ambulatory Visit: Payer: Self-pay | Admitting: Physician Assistant

## 2020-10-04 ENCOUNTER — Other Ambulatory Visit: Payer: Self-pay | Admitting: Nurse Practitioner

## 2020-10-04 VITALS — BP 138/78 | HR 84 | Temp 98.7°F | Resp 18 | Ht 71.0 in | Wt 253.0 lb

## 2020-10-04 DIAGNOSIS — R1084 Generalized abdominal pain: Secondary | ICD-10-CM

## 2020-10-04 DIAGNOSIS — L301 Dyshidrosis [pompholyx]: Secondary | ICD-10-CM

## 2020-10-04 DIAGNOSIS — R110 Nausea: Secondary | ICD-10-CM

## 2020-10-04 DIAGNOSIS — K219 Gastro-esophageal reflux disease without esophagitis: Secondary | ICD-10-CM

## 2020-10-04 MED ORDER — TRIAMCINOLONE ACETONIDE 0.1 % EX CREA
1.0000 "application " | TOPICAL_CREAM | Freq: Two times a day (BID) | CUTANEOUS | 0 refills | Status: DC
  Filled 2020-10-04: qty 30, 15d supply, fill #0

## 2020-10-04 MED ORDER — ONDANSETRON 8 MG PO TBDP
ORAL_TABLET | Freq: Three times a day (TID) | ORAL | 1 refills | Status: DC | PRN
  Filled 2020-10-04: qty 30, 10d supply, fill #0
  Filled 2020-11-02: qty 22, 7d supply, fill #1
  Filled 2020-11-02: qty 30, 10d supply, fill #1
  Filled 2020-11-27: qty 8, 3d supply, fill #2

## 2020-10-04 MED ORDER — PANTOPRAZOLE SODIUM 40 MG PO TBEC
DELAYED_RELEASE_TABLET | Freq: Every day | ORAL | 3 refills | Status: DC
  Filled 2020-10-04 – 2021-01-31 (×2): qty 30, 30d supply, fill #0

## 2020-10-04 NOTE — Progress Notes (Signed)
Patient verified DOB Patient complains of nausea. Patient request refills protonix, triamicinolone jars due to amount of areas affected, and zofran.

## 2020-10-04 NOTE — Patient Instructions (Signed)
[facility]  I encourage you to work on increasing your hydration.  Once you have completed your ultrasound, we will call you with those results.  I strongly encourage you to figure out how to check your voicemail  Please let us know there is anything else we can do for you  Adrian Rad, PA-C Physician Assistant [facility] [email or web address]   Dehydration, Adult Dehydration is a condition in which there is not enough water or other fluids in the body. This happens when a person loses more fluids than he or she takes in. Important organs, such as the kidneys, brain, and heart, cannot function without a proper amount of fluids. Any loss of fluids from the body can lead to dehydration. Dehydration can be mild, moderate, or severe. It should be treated right away to prevent it from becoming severe. What are the causes? Dehydration may be caused by:  Conditions that cause loss of water or other fluids, such as diarrhea, vomiting, or sweating or urinating a lot.  Not drinking enough fluids, especially when you are ill or doing activities that require a lot of energy.  Other illnesses and conditions, such as fever or infection.  Certain medicines, such as medicines that remove excess fluid from the body (diuretics).  Lack of safe drinking water.  Not being able to get enough water and food. What increases the risk? The following factors may make you more likely to develop this condition:  Having a long-term (chronic) illness that has not been treated properly, such as diabetes, heart disease, or kidney disease.  Being 6 years of age or older.  Having a disability.  Living in a place that is high in altitude, where thinner, drier air causes more fluid loss.  Doing exercises that put stress on your body for a long time (endurance sports). What are the  signs or symptoms? Symptoms of dehydration depend on how severe it is. Mild or moderate dehydration  Thirst.  Dry lips or dry mouth.  Dizziness or light-headedness, especially when standing up from a seated position.  Muscle cramps.  Dark urine. Urine may be the color of tea.  Less urine or tears produced than usual.  Headache. Severe dehydration  Changes in skin. Your skin may be cold and clammy, blotchy, or pale. Your skin also may not return to normal after being lightly pinched and released.  Little or no tears, urine, or sweat.  Changes in vital signs, such as rapid breathing and low blood pressure. Your pulse may be weak or may be faster than 100 beats a minute when you are sitting still.  Other changes, such as: ? Feeling very thirsty. ? Sunken eyes. ? Cold hands and feet. ? Confusion. ? Being very tired (lethargic) or having trouble waking from sleep. ? Short-term weight loss. ? Loss of consciousness. How is this diagnosed? This condition is diagnosed based on your symptoms and a physical exam. You may have blood and urine tests to help confirm the diagnosis. How is this treated? Treatment for this condition depends on how severe it is. Treatment should be started right away. Do not wait until dehydration becomes severe. Severe dehydration is an emergency and needs to be treated in a hospital.  Mild or moderate dehydration can be treated at home. You may be asked to: ? Drink more fluids. ? Drink an oral rehydration solution (ORS). This drink helps restore proper amounts of fluids  and salts and minerals in the blood (electrolytes).  Severe dehydration can be treated: ? With IV fluids. ? By correcting abnormal levels of electrolytes. This is often done by giving electrolytes through a tube that is passed through your nose and into your stomach (nasogastric tube, or NG tube). ? By treating the underlying cause of dehydration. Follow these instructions at home: Oral  rehydration solution If told by your health care provider, drink an ORS:  Make an ORS by following instructions on the package.  Start by drinking small amounts, about  cup (120 mL) every 5-10 minutes.  Slowly increase how much you drink until you have taken the amount recommended by your health care provider. Eating and drinking  Drink enough clear fluid to keep your urine pale yellow. If you were told to drink an ORS, finish the ORS first and then start slowly drinking other clear fluids. Drink fluids such as: ? Water. Do not drink only water. Doing that can lead to hyponatremia, which is having too little salt (sodium) in the body. ? Water from ice chips you suck on. ? Fruit juice that you have added water to (diluted fruit juice). ? Low-calorie sports drinks.  Eat foods that contain a healthy balance of electrolytes, such as bananas, oranges, potatoes, tomatoes, and spinach.  Do not drink alcohol.  Avoid the following: ? Drinks that contain a lot of sugar. These include high-calorie sports drinks, fruit juice that is not diluted, and soda. ? Caffeine. ? Foods that are greasy or contain a lot of fat or sugar.         General instructions  Take over-the-counter and prescription medicines only as told by your health care provider.  Do not take sodium tablets. Doing that can lead to having too much sodium in the body (hypernatremia).  Return to your normal activities as told by your health care provider. Ask your health care provider what activities are safe for you.  Keep all follow-up visits as told by your health care provider. This is important. Contact a health care provider if:  You have muscle cramps, pain, or discomfort, such as: ? Pain in your abdomen and the pain gets worse or stays in one area (localizes). ? Stiff neck.  You have a rash.  You are more irritable than usual.  You are sleepier or have a harder time waking than usual.  You feel weak or  dizzy.  You feel very thirsty. Get help right away if you have:  Any symptoms of severe dehydration.  Symptoms of vomiting, such as: ? You cannot eat or drink without vomiting. ? Vomiting gets worse or does not go away. ? Vomit includes blood or green matter (bile).  Symptoms that get worse with treatment.  A fever.  A severe headache.  Problems with urination or bowel movements, such as: ? Diarrhea that gets worse or does not go away. ? Blood in your stool (feces). This may cause stool to look black and tarry. ? Not urinating, or urinating only a small amount of very dark urine, within 6-8 hours.  Trouble breathing. These symptoms may represent a serious problem that is an emergency. Do not wait to see if the symptoms will go away. Get medical help right away. Call your local emergency services (911 in the [place] Do not drive yourself to the hospital. Summary  Dehydration is a condition in which there is not enough water or other fluids in the body. This happens when a person loses  more fluids than he or she takes in.  Treatment for this condition depends on how severe it is. Treatment should be started right away. Do not wait until dehydration becomes severe.  Drink enough clear fluid to keep your urine pale yellow. If you were told to drink an oral rehydration solution (ORS), finish the ORS first and then start slowly drinking other clear fluids.  Take over-the-counter and prescription medicines only as told by your health care provider.  Get help right away if you have any symptoms of severe dehydration. This information is not intended to replace advice given to you by your health care provider. Make sure you discuss any questions you have with your health care provider. Document Revised: 01/27/2019 Document Reviewed: 01/27/2019 Elsevier Patient Education  [place].

## 2020-10-04 NOTE — Progress Notes (Signed)
Established Patient Office Visit  Subjective:  Patient ID: Adrian [name], male    DOB: 1976/05/31  Age: 45 y.o. MRN: [id number]  CC:  Chief Complaint  Patient presents with  . Abdominal Pain    HPI Adrian [name] reports that he continues to have abdominal pain, states that he has been taking the Protonix with some relief, but since he has been out of it his abdominal pain has worsened.  States that he will feel a couple of knots over on the left upper part of his abdomen as well.  Reports he continues to have episodes of nausea, states that the Zofran does offer relief.  Reports that he does use the hydroxyzine a few times a week with relief of anxiety.  Requests a refill of Kenalog, states that he has previously used this on his hands with relief.  Reports that he is unable to use lotions to help him with his eczema, states that all lotions tend to bother his skin.       Past Medical History:  Diagnosis Date  . 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 Eczema   . Environmental allergies    grass  . Foot fracture, left   . Heart murmur   . Migraine   . Multiple food allergies    fish, tomatoes, peanuts, eggs, chicken and others    Past Surgical History:  Procedure Laterality Date  . INGUINAL HERNIA REPAIR  1994    Family History  Problem Relation Age of Onset  . Asthma Father   . Diabetes Mother   . Hypertension Mother   . Clotting disorder Other        Aunt  . Breast cancer Other        Grandmother  . Prostate cancer Other        uncle  . Hyperlipidemia Other        grandparent  . Hypertension Other        grandparent/grandparent  . Kidney disease Other        aunt/uncle/grandparent  . Stroke Other        grandparent    Social History   Socioeconomic History  . Marital status: Single    Spouse name: Not on file  . Number of children: Not on file  . Years of education: Not on file  . Highest education level: Not on file  Occupational History  . Not on file   Tobacco Use  . Smoking status: Never Smoker  . Smokeless tobacco: Never Used  Vaping Use  . Vaping Use: Never used  Substance and Sexual Activity  . Alcohol use: Never  . Drug use: Yes    Types: Marijuana  . Sexual activity: Yes  Other Topics Concern  . Not on file  Social History Narrative   ** Merged History Encounter **       Currently unemployed   1 son   Incarcerated on drug charges in 2010 for ~1 year.    Social Determinants of Health   Financial Resource Strain: Not on file  Food Insecurity: Not on file  Transportation Needs: Not on file  Physical Activity: Not on file  Stress: Not on file  Social Connections: Not on file  Intimate Partner Violence: Not on file    Outpatient Medications Prior to Visit  Medication Sig Dispense Refill  . albuterol (VENTOLIN HFA) 108 (90 Base) MCG/ACT inhaler INHALE 2 PUFFS INTO THE LUNGS EVERY 6 (SIX) HOURS AS NEEDED FOR WHEEZING OR  SHORTNESS OF BREATH (COUGH). 18 g 1  . fluticasone (FLOVENT HFA) 44 MCG/ACT inhaler Inhale 1 puff into the lungs 1 day or 1 dose for 1 dose. 10.6 g 3  . hydrOXYzine (ATARAX/VISTARIL) 25 MG tablet TAKE 1/2 TO 1 TABLET BY MOUTH EVERY 8 HOURS AS NEEDED FOR ANXIETY. 60 tablet 1  . hydrOXYzine (ATARAX/VISTARIL) 25 MG tablet TAKE 1 TABLET (25 MG TOTAL) BY MOUTH 3 (THREE) TIMES DAILY AS NEEDED. 60 tablet 1  . meclizine (ANTIVERT) 25 MG tablet Take 1 tablet (25 mg total) by mouth 3 (three) times daily. 30 tablet 2  . TRIAMCINOLONE PO Take by mouth.    . Vitamin D, Ergocalciferol, (DRISDOL) 1.25 MG (50000 UNIT) CAPS capsule TAKE 1 CAPSULE (50,000 UNITS TOTAL) BY MOUTH EVERY 7 (SEVEN) DAYS. 12 capsule 1  . ondansetron (ZOFRAN-ODT) 8 MG disintegrating tablet TAKE 1 TABLET (8 MG TOTAL) BY MOUTH EVERY 8 (EIGHT) HOURS AS NEEDED FOR NAUSEA OR VOMITING. 30 tablet 1  . pantoprazole (PROTONIX) 40 MG tablet TAKE 1 TABLET (40 MG TOTAL) BY MOUTH DAILY. 30 tablet 3   No facility-administered medications prior to visit.     Allergies  Allergen Reactions  . Apple Shortness Of Breath, Itching, Nausea Only and Swelling    Tongue itches also  . Atrovent [Ipratropium] Anaphylaxis    Atrovent contains peanut oil and PT has a HX of anaphlaxis to peanuts  . Banana Shortness Of Breath and Itching    Makes tongue itch  . Carrot Oil Shortness Of Breath and Itching  . Fish Allergy Anaphylaxis, Hives and Swelling  . Fruit & Vegetable Daily [Nutritional Supplements] Shortness Of Breath and Itching    Patient stated that he allergic to most all fruits  . Peanut-Containing Drug Products Anaphylaxis, Hives, Nausea And Vomiting and Swelling  . Shellfish Allergy Anaphylaxis, Hives, Swelling and Other (See Comments)    "ALL SEAFOOD"  . Tomato Anaphylaxis, Hives and Swelling  . Chicken Meat (Diagnostic) Other (See Comments)    Tested allergic to this years ago  . Latex Itching    No powdered gloves!!  . Pork-Derived Products Other (See Comments)    CAUSES SEVERE HEADACHES  . Soy Allergy Other (See Comments)    Tested allergic to this years ago  . Grass Extracts [Gramineae Pollens] Hives, Rash and Other (See Comments)    Wheezing also  . Tree Extract Other (See Comments)    Patient tested allergic to this years ago    ROS Review of Systems  Constitutional: Negative.   HENT: Negative.   Eyes: Negative.   Respiratory: Negative for shortness of breath.   Cardiovascular: Negative for chest pain.  Gastrointestinal: Positive for abdominal pain and nausea. Negative for constipation, diarrhea and vomiting.  Endocrine: Negative.   Genitourinary: Negative.   Musculoskeletal: Negative.   Skin: Positive for rash.  Allergic/Immunologic: Negative.   Neurological: Negative.   Hematological: Negative.   Psychiatric/Behavioral: Negative.       Objective:    Physical Exam Vitals and nursing note reviewed.  Constitutional:      Appearance: Normal appearance.  HENT:     Head: Normocephalic and atraumatic.     Right  Ear: External ear normal.     Left Ear: External ear normal.     Nose: Nose normal.     Mouth/Throat:     Mouth: Mucous membranes are moist.     Pharynx: Oropharynx is clear.  Eyes:     Extraocular Movements: Extraocular movements intact.  Conjunctiva/sclera: Conjunctivae normal.     Pupils: Pupils are equal, round, and reactive to light.  Cardiovascular:     Rate and Rhythm: Normal rate and regular rhythm.     Pulses: Normal pulses.     Heart sounds: Normal heart sounds.  Pulmonary:     Effort: Pulmonary effort is normal.     Breath sounds: Normal breath sounds.  Abdominal:     Tenderness: There is abdominal tenderness in the epigastric area.  Musculoskeletal:        General: Normal range of motion.     Cervical back: Normal range of motion and neck supple.  Skin:    General: Skin is warm and dry.  Neurological:     General: No focal deficit present.     Mental Status: He is alert and oriented to person, place, and time.  Psychiatric:        Mood and Affect: Mood normal.        Behavior: Behavior normal.        Thought Content: Thought content normal.        Judgment: Judgment normal.     BP 138/78 (BP Location: Left Arm, Patient Position: Sitting, Cuff Size: Normal)   Pulse 84   Temp 98.7 F (37.1 C)   Resp 18   Ht 5\' 11"  (1.803 m)   Wt 253 lb (114.8 kg)   SpO2 97%   BMI 35.29 kg/m  Wt Readings from Last 3 Encounters:  10/04/20 253 lb (114.8 kg)  08/02/20 241 lb (109.3 kg)  03/20/20 226 lb (102.5 kg)     Health Maintenance Due  Topic Date Due  . COVID-19 Vaccine (1) Never done    There are no preventive care reminders to display for this patient.  Lab Results  Component Value Date   TSH 0.856 08/02/2020   Lab Results  Component Value Date   WBC 5.5 08/02/2020   HGB 14.3 08/02/2020   HCT 43.5 08/02/2020   MCV 81 08/02/2020   PLT 297 08/02/2020   Lab Results  Component Value Date   NA 139 08/02/2020   K 4.3 08/02/2020   CO2 26 03/20/2020    GLUCOSE 89 08/02/2020   BUN 11 08/02/2020   CREATININE 1.38 (H) 08/02/2020   BILITOT 0.3 08/02/2020   ALKPHOS 70 08/02/2020   AST 15 08/02/2020   ALT 16 03/20/2020   PROT 7.3 08/02/2020   ALBUMIN 4.1 08/02/2020   CALCIUM 9.1 08/02/2020   ANIONGAP 7 02/08/2019   Lab Results  Component Value Date   CHOL 125 03/20/2020   Lab Results  Component Value Date   HDL 32 (L) 03/20/2020   Lab Results  Component Value Date   LDLCALC 78 03/20/2020   Lab Results  Component Value Date   TRIG 72 03/20/2020   Lab Results  Component Value Date   CHOLHDL 3.9 03/20/2020   Lab Results  Component Value Date   HGBA1C 5.5 03/20/2020      Assessment & Plan:   Problem List Items Addressed This Visit      Digestive   Gastroesophageal reflux disease without esophagitis   Relevant Medications   pantoprazole (PROTONIX) 40 MG tablet   ondansetron (ZOFRAN-ODT) 8 MG disintegrating tablet     Musculoskeletal and Integument   Eczema, dyshidrotic - Primary (Chronic)   Relevant Medications   triamcinolone cream (KENALOG) 0.1 %     Other   Nausea   Relevant Medications   ondansetron (ZOFRAN-ODT) 8 MG disintegrating tablet  Other Visit Diagnoses    Generalized abdominal pain       Relevant Medications   pantoprazole (PROTONIX) 40 MG tablet   Other Relevant Orders   US Abdomen Complete     1. Generalized abdominal pain Resume Protonix.  After last office visit.  We did try to reach out to patient to review his lab results and schedule an ultrasound for further review since his labs were inconclusive regarding his abdominal pain.  Unfortunately patient did not receive messages.  Patient was scheduled for ultrasound of abdomen for further evaluation.  Red flags given for prompt reevaluation. - pantoprazole (PROTONIX) 40 MG tablet; TAKE 1 TABLET (40 MG TOTAL) BY MOUTH DAILY.  Dispense: 30 tablet; Refill: 3 - US Abdomen Complete; Future  2. Gastroesophageal reflux disease without  esophagitis  - pantoprazole (PROTONIX) 40 MG tablet; TAKE 1 TABLET (40 MG TOTAL) BY MOUTH DAILY.  Dispense: 30 tablet; Refill: 3  3. Nausea Continue current regimen as needed - ondansetron (ZOFRAN-ODT) 8 MG disintegrating tablet; TAKE 1 TABLET (8 MG TOTAL) BY MOUTH EVERY 8 (EIGHT) HOURS AS NEEDED FOR NAUSEA OR VOMITING.  Dispense: 30 tablet; Refill: 1  4. Eczema, dyshidrotic Refill per patient request - triamcinolone cream (KENALOG) 0.1 %; Apply 1 application topically 2 (two) times daily.  Dispense: 30 g; Refill: 0  Meds ordered this encounter  Medications  . pantoprazole (PROTONIX) 40 MG tablet    Sig: TAKE 1 TABLET (40 MG TOTAL) BY MOUTH DAILY.    Dispense:  30 tablet    Refill:  3    Order Specific Question:   Supervising Provider    Answer:   Joya Gaskins, PATRICK E [[id number]]  . triamcinolone cream (KENALOG) 0.1 %    Sig: Apply 1 application topically 2 (two) times daily.    Dispense:  30 g    Refill:  0    Order Specific Question:   Supervising Provider    Answer:   Asencion Noble E [[id number]]  . ondansetron (ZOFRAN-ODT) 8 MG disintegrating tablet    Sig: TAKE 1 TABLET (8 MG TOTAL) BY MOUTH EVERY 8 (EIGHT) HOURS AS NEEDED FOR NAUSEA OR VOMITING.    Dispense:  30 tablet    Refill:  1    Order Specific Question:   Supervising Provider    Answer:   Asencion Noble E [[id number]]     I have reviewed the patient's medical history (PMH, PSH, Social History, Family History, Medications, and allergies) , and have been updated if relevant. I spent 30 minutes reviewing chart and  face to face time with patient.     Follow-up: Return if symptoms worsen or fail to improve.    Loraine Grip Mayers, PA-C

## 2020-10-05 ENCOUNTER — Other Ambulatory Visit: Payer: Self-pay

## 2020-10-07 DIAGNOSIS — R1084 Generalized abdominal pain: Secondary | ICD-10-CM | POA: Insufficient documentation

## 2020-10-09 ENCOUNTER — Ambulatory Visit (HOSPITAL_COMMUNITY)
Admission: RE | Admit: 2020-10-09 | Discharge: 2020-10-09 | Disposition: A | Discharge status: Home / Self Care | Payer: Self-pay | Source: Ambulatory Visit | Attending: Physician Assistant | Admitting: Physician Assistant

## 2020-10-09 ENCOUNTER — Other Ambulatory Visit: Payer: Self-pay

## 2020-10-09 DIAGNOSIS — R1084 Generalized abdominal pain: Secondary | ICD-10-CM | POA: Insufficient documentation

## 2020-10-16 ENCOUNTER — Telehealth: Payer: Self-pay | Admitting: *Deleted

## 2020-10-16 NOTE — Telephone Encounter (Signed)
UTR patient.

## 2020-10-16 NOTE — Telephone Encounter (Signed)
Medical Assistant left message on patient's home and cell voicemail. Voicemail states to give a call back to [place] with [facility] at [phone].

## 2020-10-16 NOTE — Telephone Encounter (Signed)
-----   Message from Kennieth Rad, [place] sent at 10/10/2020 11:56 AM EDT ----- Please call patient and let him know that his ultrasound did not show any indications to explain his chronic abdominal pain.  It did show that he does have fatty liver.  He is welcome to return to the mobile unit to discuss this finding or follow-up with his PCP to discuss.

## 2020-11-02 ENCOUNTER — Other Ambulatory Visit: Payer: Self-pay

## 2020-11-02 MED FILL — Albuterol Sulfate Inhal Aero 108 MCG/ACT (90MCG Base Equiv): RESPIRATORY_TRACT | 25 days supply | Qty: 18 | Fill #1 | Status: CN

## 2020-11-02 MED FILL — Albuterol Sulfate Inhal Aero 108 MCG/ACT (90MCG Base Equiv): RESPIRATORY_TRACT | 25 days supply | Qty: 18 | Fill #1 | Status: AC

## 2020-11-27 ENCOUNTER — Other Ambulatory Visit: Payer: Self-pay

## 2020-11-28 ENCOUNTER — Other Ambulatory Visit: Payer: Self-pay

## 2020-12-27 ENCOUNTER — Other Ambulatory Visit: Payer: Self-pay | Admitting: Physician Assistant

## 2020-12-27 ENCOUNTER — Other Ambulatory Visit: Payer: Self-pay

## 2020-12-27 DIAGNOSIS — J4520 Mild intermittent asthma, uncomplicated: Secondary | ICD-10-CM

## 2020-12-27 MED ORDER — ALBUTEROL SULFATE HFA 108 (90 BASE) MCG/ACT IN AERS
INHALATION_SPRAY | RESPIRATORY_TRACT | 1 refills | Status: DC
  Filled 2020-12-27: qty 18, 28d supply, fill #0
  Filled 2021-01-31: qty 18, 25d supply, fill #1

## 2020-12-28 ENCOUNTER — Other Ambulatory Visit: Payer: Self-pay

## 2021-01-21 ENCOUNTER — Other Ambulatory Visit: Payer: Self-pay

## 2021-01-21 ENCOUNTER — Other Ambulatory Visit: Payer: Self-pay | Admitting: Physician Assistant

## 2021-01-21 DIAGNOSIS — R110 Nausea: Secondary | ICD-10-CM

## 2021-01-31 ENCOUNTER — Other Ambulatory Visit: Payer: Self-pay | Admitting: Physician Assistant

## 2021-01-31 ENCOUNTER — Other Ambulatory Visit: Payer: Self-pay

## 2021-01-31 DIAGNOSIS — L301 Dyshidrosis [pompholyx]: Secondary | ICD-10-CM

## 2021-01-31 DIAGNOSIS — F411 Generalized anxiety disorder: Secondary | ICD-10-CM

## 2021-02-01 ENCOUNTER — Other Ambulatory Visit: Payer: Self-pay

## 2021-02-06 ENCOUNTER — Other Ambulatory Visit: Payer: Self-pay

## 2021-02-25 ENCOUNTER — Other Ambulatory Visit: Payer: Self-pay

## 2021-02-26 ENCOUNTER — Other Ambulatory Visit: Payer: Self-pay

## 2021-02-26 ENCOUNTER — Ambulatory Visit: Payer: Self-pay | Admitting: Physician Assistant

## 2021-02-26 VITALS — BP 140/88 | HR 50 | Temp 98.7°F | Resp 18 | Ht 71.0 in | Wt 253.0 lb

## 2021-02-26 DIAGNOSIS — R1084 Generalized abdominal pain: Secondary | ICD-10-CM

## 2021-02-26 DIAGNOSIS — K760 Fatty (change of) liver, not elsewhere classified: Secondary | ICD-10-CM

## 2021-02-26 DIAGNOSIS — K0889 Other specified disorders of teeth and supporting structures: Secondary | ICD-10-CM

## 2021-02-26 DIAGNOSIS — R110 Nausea: Secondary | ICD-10-CM

## 2021-02-26 DIAGNOSIS — J4520 Mild intermittent asthma, uncomplicated: Secondary | ICD-10-CM

## 2021-02-26 DIAGNOSIS — K047 Periapical abscess without sinus: Secondary | ICD-10-CM

## 2021-02-26 DIAGNOSIS — E6609 Other obesity due to excess calories: Secondary | ICD-10-CM

## 2021-02-26 DIAGNOSIS — F5104 Psychophysiologic insomnia: Secondary | ICD-10-CM

## 2021-02-26 DIAGNOSIS — G43009 Migraine without aura, not intractable, without status migrainosus: Secondary | ICD-10-CM

## 2021-02-26 DIAGNOSIS — F32A Depression, unspecified: Secondary | ICD-10-CM

## 2021-02-26 DIAGNOSIS — F411 Generalized anxiety disorder: Secondary | ICD-10-CM

## 2021-02-26 DIAGNOSIS — F419 Anxiety disorder, unspecified: Secondary | ICD-10-CM

## 2021-02-26 DIAGNOSIS — L301 Dyshidrosis [pompholyx]: Secondary | ICD-10-CM

## 2021-02-26 DIAGNOSIS — Z6835 Body mass index (BMI) 35.0-35.9, adult: Secondary | ICD-10-CM

## 2021-02-26 DIAGNOSIS — E559 Vitamin D deficiency, unspecified: Secondary | ICD-10-CM

## 2021-02-26 DIAGNOSIS — E66812 Obesity, class 2: Secondary | ICD-10-CM

## 2021-02-26 DIAGNOSIS — K219 Gastro-esophageal reflux disease without esophagitis: Secondary | ICD-10-CM

## 2021-02-26 MED ORDER — ALBUTEROL SULFATE HFA 108 (90 BASE) MCG/ACT IN AERS
INHALATION_SPRAY | RESPIRATORY_TRACT | 1 refills | Status: DC
  Filled 2021-02-26: qty 18, 25d supply, fill #0
  Filled 2021-03-27: qty 18, 25d supply, fill #1

## 2021-02-26 MED ORDER — ONDANSETRON 8 MG PO TBDP
ORAL_TABLET | Freq: Three times a day (TID) | ORAL | 1 refills | Status: DC | PRN
  Filled 2021-02-26: qty 30, 10d supply, fill #0
  Filled 2021-05-13: qty 30, 10d supply, fill #1

## 2021-02-26 MED ORDER — TRIAMCINOLONE ACETONIDE 0.1 % EX CREA
1.0000 "application " | TOPICAL_CREAM | Freq: Two times a day (BID) | CUTANEOUS | 0 refills | Status: DC
  Filled 2021-02-26: qty 30, 15d supply, fill #0

## 2021-02-26 MED ORDER — PENICILLIN V POTASSIUM 500 MG PO TABS
500.0000 mg | ORAL_TABLET | Freq: Three times a day (TID) | ORAL | 0 refills | Status: AC
  Filled 2021-02-26: qty 30, 10d supply, fill #0

## 2021-02-26 MED ORDER — TRAZODONE HCL 50 MG PO TABS
25.0000 mg | ORAL_TABLET | Freq: Every evening | ORAL | 3 refills | Status: DC | PRN
  Filled 2021-02-26: qty 30, 30d supply, fill #0

## 2021-02-26 MED ORDER — HYDROXYZINE HCL 25 MG PO TABS
ORAL_TABLET | Freq: Three times a day (TID) | ORAL | 1 refills | Status: DC | PRN
  Filled 2021-02-26: qty 60, 20d supply, fill #0
  Filled 2021-05-01: qty 60, 20d supply, fill #1

## 2021-02-26 MED ORDER — PANTOPRAZOLE SODIUM 40 MG PO TBEC
DELAYED_RELEASE_TABLET | Freq: Every day | ORAL | 3 refills | Status: DC
  Filled 2021-02-26: qty 30, 30d supply, fill #0
  Filled 2021-03-27: qty 30, 30d supply, fill #1
  Filled 2021-05-01: qty 30, 30d supply, fill #2
  Filled 2021-05-13: qty 30, 30d supply, fill #3

## 2021-02-26 MED ORDER — HYDROXYZINE HCL 25 MG PO TABS
ORAL_TABLET | ORAL | 1 refills | Status: DC
  Filled 2021-02-26: qty 60, fill #0
  Filled 2021-05-13 – 2021-08-20 (×2): qty 60, 20d supply, fill #0

## 2021-02-26 NOTE — Progress Notes (Signed)
Established Patient Office Visit  Subjective:  Patient ID: Adrian [name], male    DOB: 1975/09/25  Age: 45 y.o. MRN: [id number]  CC:  Chief Complaint  Patient presents with   Abdominal Pain     HPI Caige Almeda reports that he has been out of his medications for the past couple of weeks.  Reports that he has been having generalized abdominal discomfort, nausea and vomiting on a daily basis.  Reports that he does feel his symptoms are much better controlled when he is on his medication.  Reports that he has been having pain on the top of his head, states that this has been present for "many years".  Reports that he hit the top of his head on a table while working.  Reports that it will be tender if he presses down on it.  States that he has never had it evaluated before.  Reports that he has been having dental pain, states that he has several broken teeth that need to be fixed, states that he has been having swelling and tenderness on his lower left jawline.  Reports that he has been having increased depressed moods, states that he has had thoughts that he would be better off dead, but adamantly denies any type of plan.  Adamantly denies any thoughts of hurting someone else.  States that the hydroxyzine does help his anxiety.  Reports that he smokes marijuana on a daily basis, states this does offer him relief from racing thoughts while trying to sleep.  States that if he does not smoke marijuana, he has difficulty falling asleep and staying asleep.  States that he has failed over-the-counter sleep aids and melatonin.  Requests refill of his inhaler.  Reports that he does use this more often, is unable to state how many times a week, states that he attributes it to gaining weight, and anxiety.       Past Medical History:  Diagnosis Date   Asthma    at birth   Eczema    Environmental allergies    grass   Foot fracture, left    Heart murmur    Migraine    Multiple food  allergies    fish, tomatoes, peanuts, eggs, chicken and others    Past Surgical History:  Procedure Laterality Date   INGUINAL HERNIA REPAIR  1994    Family History  Problem Relation Age of Onset   Asthma Father    Diabetes Mother    Hypertension Mother    Clotting disorder Other        Aunt   Breast cancer Other        Grandmother   Prostate cancer Other        uncle   Hyperlipidemia Other        grandparent   Hypertension Other        grandparent/grandparent   Kidney disease Other        aunt/uncle/grandparent   Stroke Other        grandparent    Social History   Socioeconomic History   Marital status: Single    Spouse name: Not on file   Number of children: Not on file   Years of education: Not on file   Highest education level: Not on file  Occupational History   Not on file  Tobacco Use   Smoking status: Never   Smokeless tobacco: Never  Vaping Use   Vaping Use: Never used  Substance and Sexual Activity  Alcohol use: Never   Drug use: Yes    Types: Marijuana   Sexual activity: Yes  Other Topics Concern   Not on file  Social History Narrative   ** Merged History Encounter **       Currently unemployed   1 son   Incarcerated on drug charges in 2010 for ~1 year.    Social Determinants of Health   Financial Resource Strain: Not on file  Food Insecurity: Not on file  Transportation Needs: Not on file  Physical Activity: Not on file  Stress: Not on file  Social Connections: Not on file  Intimate Partner Violence: Not on file    Outpatient Medications Prior to Visit  Medication Sig Dispense Refill   meclizine (ANTIVERT) 25 MG tablet Take 1 tablet (25 mg total) by mouth 3 (three) times daily. 30 tablet 2   TRIAMCINOLONE PO Take by mouth.     Vitamin D, Ergocalciferol, (DRISDOL) 1.25 MG (50000 UNIT) CAPS capsule TAKE 1 CAPSULE (50,000 UNITS TOTAL) BY MOUTH EVERY 7 (SEVEN) DAYS. 12 capsule 1   albuterol (VENTOLIN HFA) 108 (90 Base) MCG/ACT  inhaler INHALE 2 PUFFS INTO THE LUNGS EVERY 6 (SIX) HOURS AS NEEDED FOR WHEEZING OR SHORTNESS OF BREATH (COUGH). 18 g 1   hydrOXYzine (ATARAX/VISTARIL) 25 MG tablet TAKE 1/2 TO 1 TABLET BY MOUTH EVERY 8 HOURS AS NEEDED FOR ANXIETY. 60 tablet 1   hydrOXYzine (ATARAX/VISTARIL) 25 MG tablet TAKE 1 TABLET (25 MG TOTAL) BY MOUTH 3 (THREE) TIMES DAILY AS NEEDED. 60 tablet 1   ondansetron (ZOFRAN-ODT) 8 MG disintegrating tablet TAKE 1 TABLET (8 MG TOTAL) BY MOUTH EVERY 8 (EIGHT) HOURS AS NEEDED FOR NAUSEA OR VOMITING. 30 tablet 1   pantoprazole (PROTONIX) 40 MG tablet TAKE 1 TABLET (40 MG TOTAL) BY MOUTH DAILY. 30 tablet 3   triamcinolone cream (KENALOG) 0.1 % Apply 1 application topically 2 (two) times daily. 30 g 0   fluticasone (FLOVENT HFA) 44 MCG/ACT inhaler Inhale 1 puff into the lungs 1 day or 1 dose for 1 dose. 10.6 g 3   No facility-administered medications prior to visit.    Allergies  Allergen Reactions   Apple Shortness Of Breath, Itching, Nausea Only and Swelling    Tongue itches also   Atrovent [Ipratropium] Anaphylaxis    Atrovent contains peanut oil and PT has a HX of anaphlaxis to peanuts   Banana Shortness Of Breath and Itching    Makes tongue itch   Carrot Oil Shortness Of Breath and Itching   Fish Allergy Anaphylaxis, Hives and Swelling   Fruit & Vegetable Daily [Nutritional Supplements] Shortness Of Breath and Itching    Patient stated that he allergic to most all fruits   Peanut-Containing Drug Products Anaphylaxis, Hives, Nausea And Vomiting and Swelling   Shellfish Allergy Anaphylaxis, Hives, Swelling and Other (See Comments)    "ALL SEAFOOD"   Tomato Anaphylaxis, Hives and Swelling   Chicken Meat (Diagnostic) Other (See Comments)    Tested allergic to this years ago   Latex Itching    No powdered gloves!!   Pork-Derived Products Other (See Comments)    CAUSES SEVERE HEADACHES   Soy Allergy Other (See Comments)    Tested allergic to this years ago   Grass Extracts  [Gramineae Pollens] Hives, Rash and Other (See Comments)    Wheezing also   Tree Extract Other (See Comments)    Patient tested allergic to this years ago    ROS Review of Systems  Constitutional:  Negative for chills and fever.  HENT: Negative.    Eyes: Negative.   Respiratory:  Negative for shortness of breath.   Cardiovascular:  Negative for chest pain.  Gastrointestinal:  Positive for abdominal pain, nausea and vomiting. Negative for constipation and diarrhea.  Endocrine: Negative.   Genitourinary: Negative.   Musculoskeletal: Negative.   Skin:  Positive for rash.  Allergic/Immunologic: Negative.   Neurological:  Positive for headaches. Negative for speech difficulty and weakness.  Hematological: Negative.   Psychiatric/Behavioral:  Positive for dysphoric mood and sleep disturbance. Negative for self-injury and suicidal ideas. The patient is nervous/anxious.      Objective:    Physical Exam Vitals and nursing note reviewed.  Constitutional:      General: He is not in acute distress.    Appearance: Normal appearance. He is obese. He is not ill-appearing.  HENT:     Head: Normocephalic and atraumatic.     Right Ear: External ear normal.     Left Ear: External ear normal.     Nose: Nose normal.     Mouth/Throat:     Mouth: Mucous membranes are moist.     Pharynx: Oropharynx is clear.  Eyes:     Extraocular Movements: Extraocular movements intact.     Conjunctiva/sclera: Conjunctivae normal.     Pupils: Pupils are equal, round, and reactive to light.  Cardiovascular:     Rate and Rhythm: Normal rate and regular rhythm.     Pulses: Normal pulses.     Heart sounds: Normal heart sounds.  Pulmonary:     Effort: Pulmonary effort is normal.     Breath sounds: Normal breath sounds.  Abdominal:     General: Abdomen is flat. Bowel sounds are normal.     Tenderness: There is generalized abdominal tenderness. Negative signs include Murphy's sign.  Musculoskeletal:         General: Normal range of motion.     Cervical back: Normal range of motion and neck supple.  Skin:    General: Skin is warm.     Findings: Rash present. Rash is scaling.     Comments: Noted on both elbow flexors and both ankles  Neurological:     General: No focal deficit present.     Mental Status: He is alert and oriented to person, place, and time.  Psychiatric:        Attention and Perception: Attention normal.        Mood and Affect: Mood normal.        Speech: Speech normal.        Behavior: Behavior normal. Behavior is cooperative.        Thought Content: Thought content normal.        Cognition and Memory: Cognition normal.        Judgment: Judgment normal.    BP 140/88 (BP Location: Left Arm, Patient Position: Sitting, Cuff Size: Large)   Pulse (!) 50   Temp 98.7 F (37.1 C) (Oral)   Resp 18   Ht _0  (1.803 m)   Wt 253 lb (114.8 kg)   SpO2 100%   BMI 35.29 kg/m  Wt Readings from Last 3 Encounters:  02/26/21 253 lb (114.8 kg)  10/04/20 253 lb (114.8 kg)  08/02/20 241 lb (109.3 kg)     Health Maintenance Due  Topic Date Due   COVID-19 Vaccine (1) Never done   COLONOSCOPY (Pts 45-71yr Insurance coverage will need to be confirmed)  Never done   INFLUENZA VACCINE  01/28/2021    There are no preventive care reminders to display for this patient.  Lab Results  Component Value Date   TSH 0.856 08/02/2020   Lab Results  Component Value Date   WBC 5.5 08/02/2020   HGB 14.3 08/02/2020   HCT 43.5 08/02/2020   MCV 81 08/02/2020   PLT 297 08/02/2020   Lab Results  Component Value Date   NA 142 02/26/2021   K 4.4 02/26/2021   CO2 26 03/20/2020   GLUCOSE 92 02/26/2021   BUN 6 02/26/2021   CREATININE 1.14 02/26/2021   BILITOT 0.4 02/26/2021   ALKPHOS 83 02/26/2021   AST 19 02/26/2021   ALT 16 03/20/2020   PROT 7.6 02/26/2021   ALBUMIN 4.5 02/26/2021   CALCIUM 9.2 02/26/2021   ANIONGAP 7 02/08/2019   EGFR 81 02/26/2021   Lab Results  Component  Value Date   CHOL 125 03/20/2020   Lab Results  Component Value Date   HDL 32 (L) 03/20/2020   Lab Results  Component Value Date   LDLCALC 78 03/20/2020   Lab Results  Component Value Date   TRIG 72 03/20/2020   Lab Results  Component Value Date   CHOLHDL 3.9 03/20/2020   Lab Results  Component Value Date   HGBA1C 5.5 03/20/2020      Assessment & Plan:   Problem List Items Addressed This Visit       Respiratory   Asthma in adult, mild intermittent, uncomplicated (Chronic)   Relevant Medications   albuterol (VENTOLIN HFA) 108 (90 Base) MCG/ACT inhaler     Digestive   Gastroesophageal reflux disease without esophagitis   Relevant Medications   ondansetron (ZOFRAN-ODT) 8 MG disintegrating tablet   pantoprazole (PROTONIX) 40 MG tablet   Fatty liver   Dental infection   Relevant Medications   penicillin v potassium (VEETID) 500 MG tablet     Musculoskeletal and Integument   Eczema, dyshidrotic (Chronic)   Relevant Medications   triamcinolone cream (KENALOG) 0.1 %     Other   Nausea   Relevant Medications   ondansetron (ZOFRAN-ODT) 8 MG disintegrating tablet   Anxiety state   Relevant Medications   hydrOXYzine (ATARAX/VISTARIL) 25 MG tablet   hydrOXYzine (ATARAX/VISTARIL) 25 MG tablet   traZODone (DESYREL) 50 MG tablet   Generalized abdominal pain - Primary   Relevant Medications   pantoprazole (PROTONIX) 40 MG tablet   Other Relevant Orders   Comp. Metabolic Panel (12) (Completed)   Vitamin B12 (Completed)   Class 2 obesity due to excess calories without serious comorbidity with body mass index (BMI) of 35.0 to 35.9 in adult   Anxiety and depression   Relevant Medications   hydrOXYzine (ATARAX/VISTARIL) 25 MG tablet   hydrOXYzine (ATARAX/VISTARIL) 25 MG tablet   traZODone (DESYREL) 50 MG tablet   Other Relevant Orders   Ambulatory referral to Social Work   Vitamin D deficiency   Relevant Orders   Vitamin D, 25-hydroxy (Completed)    Psychophysiological insomnia   Relevant Medications   traZODone (DESYREL) 50 MG tablet    Meds ordered this encounter  Medications   albuterol (VENTOLIN HFA) 108 (90 Base) MCG/ACT inhaler    Sig: INHALE 2 PUFFS INTO THE LUNGS EVERY 6 (SIX) HOURS AS NEEDED FOR WHEEZING OR SHORTNESS OF BREATH (COUGH).    Dispense:  18 g    Refill:  1    Order Specific Question:   Supervising Provider    Answer:   Noralyn Pick  ondansetron (ZOFRAN-ODT) 8 MG disintegrating tablet    Sig: TAKE 1 TABLET (8 MG TOTAL) BY MOUTH EVERY 8 (EIGHT) HOURS AS NEEDED FOR NAUSEA OR VOMITING.    Dispense:  30 tablet    Refill:  1    Order Specific Question:   Supervising Provider    Answer:   Asencion Noble E [[id number]]   pantoprazole (PROTONIX) 40 MG tablet    Sig: TAKE 1 TABLET (40 MG TOTAL) BY MOUTH DAILY.    Dispense:  30 tablet    Refill:  3    Order Specific Question:   Supervising Provider    Answer:   Elsie Stain [[id number]]   triamcinolone cream (KENALOG) 0.1 %    Sig: Apply 1 application topically 2 (two) times daily.    Dispense:  30 g    Refill:  0    Order Specific Question:   Supervising Provider    Answer:   Asencion Noble E [[id number]]   penicillin v potassium (VEETID) 500 MG tablet    Sig: Take 1 tablet (500 mg total) by mouth 3 (three) times daily for 10 days.    Dispense:  30 tablet    Refill:  0    Order Specific Question:   Supervising Provider    Answer:   Asencion Noble E [[id number]]   hydrOXYzine (ATARAX/VISTARIL) 25 MG tablet    Sig: TAKE 1 TABLET (25 MG TOTAL) BY MOUTH 3 (THREE) TIMES DAILY AS NEEDED.    Dispense:  60 tablet    Refill:  1    Order Specific Question:   Supervising Provider    Answer:   Joya Gaskins, PATRICK E [[id number]]   hydrOXYzine (ATARAX/VISTARIL) 25 MG tablet    Sig: TAKE 1/2 TO 1 TABLET BY MOUTH EVERY 8 HOURS AS NEEDED FOR ANXIETY.    Dispense:  60 tablet    Refill:  1    Order Specific Question:   Supervising Provider    Answer:   Asencion Noble E [[id number]]    traZODone (DESYREL) 50 MG tablet    Sig: Take 0.5-1 tablets (25-50 mg total) by mouth at bedtime as needed for sleep.    Dispense:  30 tablet    Refill:  3    Order Specific Question:   Supervising Provider    Answer:   Joya Gaskins, PATRICK E [[id number]]  1. Generalized abdominal pain Patient has been seen for this complaint several times on the mobile unit.Ultrasound of abdomen was completed in April 2022    IMPRESSION: 1. Slightly echogenic liver as may be seen with steatosis. 2. Otherwise negative abdominal ultrasound     Electronically Signed   By: Donavan Foil M.D.   On: 10/09/2020 17:35  Patient strongly encouraged to complete financial assistance application, given [facility]  Patient was given appointment to establish care at [facility] with Dr. Redmond Pulling on April 01, 2021.  Resume Protonix, patient encouraged to discontinue cannabis use   Patient education given on Mediterranean style eating, lifestyle modifications - Comp. Metabolic Panel (12) - pantoprazole (PROTONIX) 40 MG tablet; TAKE 1 TABLET (40 MG TOTAL) BY MOUTH DAILY.  Dispense: 30 tablet; Refill: 3 - Vitamin B12  2. Psychophysiological insomnia Trial trazodone, patient education given on good sleep hygiene - traZODone (DESYREL) 50 MG tablet; Take 0.5-1 tablets (25-50 mg total) by mouth at bedtime as needed for sleep.  Dispense: 30 tablet; Refill: 3  3. Anxiety and depression Resume hydroxyzine,  patient agreeable to referral for CBT - hydrOXYzine (ATARAX/VISTARIL) 25 MG tablet; TAKE 1 TABLET (25 MG TOTAL) BY MOUTH 3 (THREE) TIMES DAILY AS NEEDED.  Dispense: 60 tablet; Refill: 1 - Ambulatory referral to Social Work  4. Gastroesophageal reflux disease without esophagitis Resume Protonix - pantoprazole (PROTONIX) 40 MG tablet; TAKE 1 TABLET (40 MG TOTAL) BY MOUTH DAILY.  Dispense: 30 tablet; Refill: 3  5. Nausea Resume Zofran as  needed - ondansetron (ZOFRAN-ODT) 8 MG disintegrating tablet; TAKE 1 TABLET (8 MG TOTAL) BY MOUTH EVERY 8 (EIGHT) HOURS AS NEEDED FOR NAUSEA OR VOMITING.  Dispense: 30 tablet; Refill: 1  6. Eczema, dyshidrotic Resume Kenalog - triamcinolone cream (KENALOG) 0.1 %; Apply 1 application topically 2 (two) times daily.  Dispense: 30 g; Refill: 0  7. Fatty liver Patient education given on lifestyle modifications  8. Class 2 obesity due to excess calories without serious comorbidity with body mass index (BMI) of 35.0 to 35.9 in adult   9. Asthma in adult, mild intermittent, uncomplicated Continue current regimen, patient encouraged to keep log of use of inhaler have available for all office visits - albuterol (VENTOLIN HFA) 108 (90 Base) MCG/ACT inhaler; INHALE 2 PUFFS INTO THE LUNGS EVERY 6 (SIX) HOURS AS NEEDED FOR WHEEZING OR SHORTNESS OF BREATH (COUGH).  Dispense: 18 g; Refill: 1  10. Anxiety state Resume hydroxyzine - hydrOXYzine (ATARAX/VISTARIL) 25 MG tablet; TAKE 1/2 TO 1 TABLET BY MOUTH EVERY 8 HOURS AS NEEDED FOR ANXIETY.  Dispense: 60 tablet; Refill: 1  11. Dental infection Trial Pen V , patient strongly encouraged to complete financial assistance paperwork, explained process for referral to dental clinic. - penicillin v potassium (VEETID) 500 MG tablet; Take 1 tablet (500 mg total) by mouth 3 (three) times daily for 10 days.  Dispense: 30 tablet; Refill: 0  12. Vitamin D deficiency Previously took 12-week treatment, is not taking daily supplement - Vitamin D, 25-hydroxy    I have reviewed the patient's medical history (PMH, PSH, Social History, Family History, Medications, and allergies) , and have been updated if relevant. I spent 34 minutes reviewing chart and  face to face time with patient.    Follow-up: Return in about 1 month (around 04/01/2021) for with Dr. Redmond Pulling at [place].    Loraine Grip Mayers, PA-C

## 2021-02-26 NOTE — Progress Notes (Signed)
Patient reports nausea vomiting daily. Patient has eaten today. Patient has not taken medication today. Patient reports pain in the Head at a 10 located on the top and sides. Patient needs refill.

## 2021-02-26 NOTE — Patient Instructions (Signed)
Please make sure that you follow-up with the [facility], once your orange card is approved, either see your primary care provider or return to the mobile unit and we will be happy to start the referral for you to be seen at the dental clinic.  I do encourage you to work on improving your diet and modest weight loss.  I do encourage you to use the trazodone at night to help you sleep, make sure that you are getting 7 to 8 hours of proper rest every night.  Resume using the hydroxyzine to help you during the day with your anxiety levels.  I have started a referral for you to be seen by the clinical counselor for further evaluation.  We will call you with today's lab results  Adrian Rad, PA-C Physician Assistant [place] [email or web address]   Insomnia Insomnia is a sleep disorder that makes it difficult to fall asleep or stay asleep. Insomnia can cause fatigue, low energy, difficulty concentrating, moodswings, and poor performance at work or school. There are three different ways to classify insomnia: Difficulty falling asleep. Difficulty staying asleep. Waking up too early in the morning. Any type of insomnia can be long-term (chronic) or short-term (acute). Both are common. Short-term insomnia usually lasts for three months or less. Chronic insomnia occurs at least three times a week for longer than threemonths. What are the causes? Insomnia may be caused by another condition, situation, or substance, such as: Anxiety. Certain medicines. Gastroesophageal reflux disease (GERD) or other gastrointestinal conditions. Asthma or other breathing conditions. Restless legs syndrome, sleep apnea, or other sleep disorders. Chronic pain. Menopause. Stroke. Abuse of alcohol, tobacco, or illegal drugs. Mental health conditions, such as depression. Caffeine. Neurological disorders, such as  Alzheimer's disease. An overactive thyroid (hyperthyroidism). Sometimes, the cause of insomnia may not be known. What increases the risk? Risk factors for insomnia include: Gender. Women are affected more often than men. Age. Insomnia is more common as you get older. Stress. Lack of exercise. Irregular work schedule or working night shifts. Traveling between different time zones. Certain medical and mental health conditions. What are the signs or symptoms? If you have insomnia, the main symptom is having trouble falling asleep or having trouble staying asleep. This may lead to other symptoms, such as: Feeling fatigued or having low energy. Feeling nervous about going to sleep. Not feeling rested in the morning. Having trouble concentrating. Feeling irritable, anxious, or depressed. How is this diagnosed? This condition may be diagnosed based on: Your symptoms and medical history. Your health care provider may ask about: Your sleep habits. Any medical conditions you have. Your mental health. A physical exam. How is this treated? Treatment for insomnia depends on the cause. Treatment may focus on treating an underlying condition that is causing insomnia. Treatment may also include: Medicines to help you sleep. Counseling or therapy. Lifestyle adjustments to help you sleep better. Follow these instructions at home: Eating and drinking  Limit or avoid alcohol, caffeinated beverages, and cigarettes, especially close to bedtime. These can disrupt your sleep. Do not eat a large meal or eat spicy foods right before bedtime. This can lead to digestive discomfort that can make it hard for you to sleep.  Sleep habits  Keep a sleep diary to help you and your health care provider figure out what could be causing your insomnia. Write down: When you sleep. When you wake up during the night. How well you sleep. How  rested you feel the next day. Any side effects of medicines you are  taking. What you eat and drink. Make your bedroom a dark, comfortable place where it is easy to fall asleep. Put up shades or blackout curtains to block light from outside. Use a white noise machine to block noise. Keep the temperature cool. Limit screen use before bedtime. This includes: Watching TV. Using your smartphone, tablet, or computer. Stick to a routine that includes going to bed and waking up at the same times every day and night. This can help you fall asleep faster. Consider making a quiet activity, such as reading, part of your nighttime routine. Try to avoid taking naps during the day so that you sleep better at night. Get out of bed if you are still awake after 15 minutes of trying to sleep. Keep the lights down, but try reading or doing a quiet activity. When you feel sleepy, go back to bed.  General instructions Take over-the-counter and prescription medicines only as told by your health care provider. Exercise regularly, as told by your health care provider. Avoid exercise starting several hours before bedtime. Use relaxation techniques to manage stress. Ask your health care provider to suggest some techniques that may work well for you. These may include: Breathing exercises. Routines to release muscle tension. Visualizing peaceful scenes. Make sure that you drive carefully. Avoid driving if you feel very sleepy. Keep all follow-up visits as told by your health care provider. This is important. Contact a health care provider if: You are tired throughout the day. You have trouble in your daily routine due to sleepiness. You continue to have sleep problems, or your sleep problems get worse. Get help right away if: You have serious thoughts about hurting yourself or someone else. If you ever feel like you may hurt yourself or others, or have thoughts about taking your own life, get help right away. You can go to your nearest emergency department or call: Your local  emergency services (911 in the [place] A suicide crisis helpline, such as the [place] at (434)572-7181. This is open 24 hours a day. Summary Insomnia is a sleep disorder that makes it difficult to fall asleep or stay asleep. Insomnia can be long-term (chronic) or short-term (acute). Treatment for insomnia depends on the cause. Treatment may focus on treating an underlying condition that is causing insomnia. Keep a sleep diary to help you and your health care provider figure out what could be causing your insomnia. This information is not intended to replace advice given to you by your health care provider. Make sure you discuss any questions you have with your healthcare provider. Document Revised: 04/26/2020 Document Reviewed: 04/26/2020 Elsevier Patient Education  2022 [place], Adult After being diagnosed with an anxiety disorder, you may be relieved to know why you have felt or behaved a certain way. You may also feel overwhelmed about the treatment ahead and what it will mean for your life. With care and support, youcan manage this condition and recover from it. How to manage lifestyle changes Managing stress and anxiety  Stress is your body's reaction to life changes and events, both good and bad. Most stress will last just a few hours, but stress can be ongoing and can lead to more than just stress. Although stress can play a major role in anxiety, it is not the same as anxiety. Stress is usually caused by something external, such as a deadline, test, or  competition. Stress normally passes after thetriggering event has ended.  Anxiety is caused by something internal, such as imagining a terrible outcome or worrying that something will go wrong that will devastate you. Anxiety often does not go away even after the triggering event is over, and it can become long-term (chronic) worry. It is important to understand the differences between  stress and anxiety and to manage your stress effectively so that it does not lead to ananxious response. Talk with your health care provider or a counselor to learn more about reducing anxiety and stress. He or she may suggest tension reduction techniques, such as: Music therapy. This can include creating or listening to music that you enjoy and that inspires you. Mindfulness-based meditation. This involves being aware of your normal breaths while not trying to control your breathing. It can be done while sitting or walking. Centering prayer. This involves focusing on a word, phrase, or sacred image that means something to you and brings you peace. Deep breathing. To do this, expand your stomach and inhale slowly through your nose. Hold your breath for 3-5 seconds. Then exhale slowly, letting your stomach muscles relax. Self-talk. This involves identifying thought patterns that lead to anxiety reactions and changing those patterns. Muscle relaxation. This involves tensing muscles and then relaxing them. Choose a tension reduction technique that suits your lifestyle and personality. These techniques take time and practice. Set aside 5-15 minutes a day to do them. Therapists can offer counseling and training in these techniques. The training to help with anxiety may be covered by some insurance plans. Other things you can do to manage stress and anxiety include: Keeping a stress/anxiety diary. This can help you learn what triggers your reaction and then learn ways to manage your response. Thinking about how you react to certain situations. You may not be able to control everything, but you can control your response. Making time for activities that help you relax and not feeling guilty about spending your time in this way. Visual imagery and yoga can help you stay calm and relax.  Medicines Medicines can help ease symptoms. Medicines for anxiety include: Anti-anxiety drugs. Antidepressants. Medicines  are often used as a primary treatment for anxiety disorder. Medicines will be prescribed by a health care provider. When used together, medicines, psychotherapy, and tension reduction techniques may be the most effectivetreatment. Relationships Relationships can play a big part in helping you recover. Try to spend more time connecting with trusted friends and family members. Consider going to couples counseling, taking family education classes, or going to familytherapy. Therapy can help you and others better understand your condition. How to recognize changes in your anxiety Everyone responds differently to treatment for anxiety. Recovery from anxiety happens when symptoms decrease and stop interfering with your daily activities at home or work. This may mean that you will start to: Have better concentration and focus. Worry will interfere less in your daily thinking. Sleep better. Be less irritable. Have more energy. Have improved memory. It is important to recognize when your condition is getting worse. Contact your health care provider if your symptoms interfere with home or work and you feellike your condition is not improving. Follow these instructions at home: Activity Exercise. Most adults should do the following: Exercise for at least 150 minutes each week. The exercise should increase your heart rate and make you sweat (moderate-intensity exercise). Strengthening exercises at least twice a week. Get the right amount and quality of sleep. Most adults need 7-9 hours  of sleep each night. Lifestyle  Eat a healthy diet that includes plenty of vegetables, fruits, whole grains, low-fat dairy products, and lean protein. Do not eat a lot of foods that are high in solid fats, added sugars, or salt. Make choices that simplify your life. Do not use any products that contain nicotine or tobacco, such as cigarettes, e-cigarettes, and chewing tobacco. If you need help quitting, ask your health care  provider. Avoid caffeine, alcohol, and certain over-the-counter cold medicines. These may make you feel worse. Ask your pharmacist which medicines to avoid.  General instructions Take over-the-counter and prescription medicines only as told by your health care provider. Keep all follow-up visits as told by your health care provider. This is important. Where to find support You can get help and support from these sources: Self-help groups. Online and [employer]. A trusted spiritual leader. Couples counseling. Family education classes. Family therapy. Where to find more information You may find that joining a support group helps you deal with your anxiety. The following sources can help you locate counselors or support groups near you: [place]: www.mentalhealthamerica.net Anxiety and Depression Association of [place] (ADAA): https://www.clark.net/ National Alliance on Mental Illness (NAMI): www.nami.org Contact a health care provider if you: Have a hard time staying focused or finishing daily tasks. Spend many hours a day feeling worried about everyday life. Become exhausted by worry. Start to have headaches, feel tense, or have nausea. Urinate more than normal. Have diarrhea. Get help right away if you have: A racing heart and shortness of breath. Thoughts of hurting yourself or others. If you ever feel like you may hurt yourself or others, or have thoughts about taking your own life, get help right away. You can go to your nearest emergency department or call: Your local emergency services (911 in the [place] A suicide crisis helpline, such as the [facility] at (608)093-9075. This is open 24 hours a day. Summary Taking steps to learn and use tension reduction techniques can help calm you and help prevent triggering an anxiety reaction. When used together, medicines, psychotherapy, and tension reduction techniques may be the most effective  treatment. Family, friends, and partners can play a big part in helping you recover from an anxiety disorder. This information is not intended to replace advice given to you by your health care provider. Make sure you discuss any questions you have with your healthcare provider. Document Revised: 11/16/2018 Document Reviewed: 11/16/2018 Elsevier Patient Education  [place].  Fatty Liver Disease  The liver converts food into energy, removes toxic material from the blood, makes important proteins, and absorbs necessary vitamins from food. Fatty liver disease occurs when too much fat has built up in your liver cells. Fatty liverdisease is also called hepatic steatosis. In many cases, fatty liver disease does not cause symptoms or problems. It is often diagnosed when tests are being done for other reasons. However, over time, fatty liver can cause inflammation that may lead to more serious liver problems, such as scarring of the liver (cirrhosis) and liver failure. Fatty liver is associated with insulin resistance, increased body fat, high blood pressure (hypertension), and high cholesterol. These are features of metabolic syndrome and increaseyour risk for stroke, diabetes, and heart disease. What are the causes? This condition may be caused by components of metabolic syndrome: Obesity. Insulin resistance. High cholesterol. Other causes: Alcohol abuse. Poor nutrition. Cushing syndrome. Pregnancy. Certain drugs. Poisons. Some viral infections. What increases the risk? You are more  likely to develop this condition if you: Abuse alcohol. Are overweight. Have diabetes. Have hepatitis. Have a high triglyceride level. Are pregnant. What are the signs or symptoms? Fatty liver disease often does not cause symptoms. If symptoms do develop, they can include: Fatigue and weakness. Weight loss. Confusion. Nausea, vomiting, or abdominal pain. Yellowing of your skin and the white parts  of your eyes (jaundice). Itchy skin. How is this diagnosed? This condition may be diagnosed by: A physical exam and your medical history. Blood tests. Imaging tests, such as an ultrasound, CT scan, or MRI. A liver biopsy. A small sample of liver tissue is removed using a needle. The sample is then looked at under a microscope. How is this treated? Fatty liver disease is often caused by other health conditions. Treatment for fatty liver may involve medicines and lifestyle changes to manage conditions such as: Alcoholism. High cholesterol. Diabetes. Being overweight or obese. Follow these instructions at home:  Do not drink alcohol. If you have trouble quitting, ask your health care provider how to safely quit with the help of medicine or a supervised program. This is important to keep your condition from getting worse. Eat a healthy diet as told by your health care provider. Ask your health care provider about working with a dietitian to develop an eating plan. Exercise regularly. This can help you lose weight and control your cholesterol and diabetes. Talk to your health care provider about an exercise plan and which activities are best for you. Take over-the-counter and prescription medicines only as told by your health care provider. Keep all follow-up visits. This is important. Contact a health care provider if: You have trouble controlling your: Blood sugar. This is especially important if you have diabetes. Cholesterol. Drinking of alcohol. Get help right away if: You have abdominal pain. You have jaundice. You have nausea and are vomiting. You vomit blood or material that looks like coffee grounds. You have stools that are black, tar-like, or bloody. Summary Fatty liver disease develops when too much fat builds up in the cells of your liver. Fatty liver disease often causes no symptoms or problems. However, over time, fatty liver can cause inflammation that may lead to more  serious liver problems, such as scarring of the liver (cirrhosis). You are more likely to develop this condition if you abuse alcohol, are pregnant, are overweight, have diabetes, have hepatitis, or have high triglyceride or cholesterol levels. Contact your health care provider if you have trouble controlling your blood sugar, cholesterol, or drinking of alcohol. This information is not intended to replace advice given to you by your health care provider. Make sure you discuss any questions you have with your healthcare provider. Document Revised: 03/29/2020 Document Reviewed: 03/29/2020 Elsevier Patient Education  2022 [place] refers to food and lifestyle choices that are based on the traditions of countries located on the [employer]. This way of eating has been shown to help prevent certain conditions and improve outcomes forpeople who have chronic diseases, like kidney disease and heart disease. What are tips for following this plan? Lifestyle Cook and eat meals together with your family, when possible. Drink enough fluid to keep your urine clear or pale yellow. Be physically active every day. This includes: Aerobic exercise like running or swimming. Leisure activities like gardening, walking, or housework. Get 7-8 hours of sleep each night. If recommended by your health care provider, drink red wine in moderation. This means 1 glass a  day for nonpregnant women and 2 glasses a day for men. A glass of wine equals 5 oz (150 mL). Reading food labels  Check the serving size of packaged foods. For foods such as rice and pasta, the serving size refers to the amount of cooked product, not dry. Check the total fat in packaged foods. Avoid foods that have saturated fat or trans fats. Check the ingredients list for added sugars, such as corn syrup.  Shopping At the grocery store, buy most of your food from the areas near the walls of the store.  This includes: Fresh fruits and vegetables (produce). Grains, beans, nuts, and seeds. Some of these may be available in unpackaged forms or large amounts (in bulk). Fresh seafood. Poultry and eggs. Low-fat dairy products. Buy whole ingredients instead of prepackaged foods. Buy fresh fruits and vegetables in-season from local farmers markets. Buy frozen fruits and vegetables in resealable bags. If you do not have access to quality fresh seafood, buy precooked frozen shrimp or canned fish, such as tuna, salmon, or sardines. Buy small amounts of raw or cooked vegetables, salads, or olives from the deli or salad bar at your store. Stock your pantry so you always have certain foods on hand, such as olive oil, canned tuna, canned tomatoes, rice, pasta, and beans. Cooking Cook foods with extra-virgin olive oil instead of using butter or other vegetable oils. Have meat as a side dish, and have vegetables or grains as your main dish. This means having meat in small portions or adding small amounts of meat to foods like pasta or stew. Use beans or vegetables instead of meat in common dishes like chili or lasagna. Experiment with different cooking methods. Try roasting or broiling vegetables instead of steaming or sauteing them. Add frozen vegetables to soups, stews, pasta, or rice. Add nuts or seeds for added healthy fat at each meal. You can add these to yogurt, salads, or vegetable dishes. Marinate fish or vegetables using olive oil, lemon juice, garlic, and fresh herbs. Meal planning  Plan to eat 1 vegetarian meal one day each week. Try to work up to 2 vegetarian meals, if possible. Eat seafood 2 or more times a week. Have healthy snacks readily available, such as: Vegetable sticks with hummus. Greek yogurt. Fruit and nut trail mix. Eat balanced meals throughout the week. This includes: Fruit: 2-3 servings a day Vegetables: 4-5 servings a day Low-fat dairy: 2 servings a day Fish, poultry,  or lean meat: 1 serving a day Beans and legumes: 2 or more servings a week Nuts and seeds: 1-2 servings a day Whole grains: 6-8 servings a day Extra-virgin olive oil: 3-4 servings a day Limit red meat and sweets to only a few servings a month  What are my food choices? Mediterranean diet Recommended Grains: Whole-grain pasta. Brown rice. Bulgar wheat. Polenta. Couscous. Whole-wheat bread. [place]. Vegetables: Artichokes. Beets. Broccoli. Cabbage. Carrots. Eggplant. Green beans. Chard. Kale. Spinach. Onions. Leeks. Peas. Squash. Tomatoes. Peppers. Radishes. Fruits: Apples. Apricots. Avocado. Berries. Bananas. Cherries. Dates. Figs. Grapes. Lemons. Melon. Oranges. Peaches. Plums. Pomegranate. Meats and other protein foods: Beans. Almonds. Sunflower seeds. Pine nuts. Peanuts. [place]. Salmon. Scallops. Shrimp. [place]. Tilapia. Clams. Oysters. Eggs. Dairy: Low-fat milk. Cheese. Greek yogurt. Beverages: Water. Red wine. Herbal tea. Fats and oils: Extra virgin olive oil. Avocado oil. Grape seed oil. Sweets and desserts: [place] yogurt with honey. Baked apples. Poached pears. Trail mix. Seasoning and other foods: Basil. Cilantro. Coriander. Cumin. Mint. Parsley. Sage. Rosemary. Tarragon. Garlic. Oregano. Thyme.  Pepper. Balsalmic vinegar. Tahini. Hummus. Tomato sauce. Olives. Mushrooms. Limit these Grains: Prepackaged pasta or rice dishes. Prepackaged cereal with added sugar. Vegetables: Deep fried potatoes (french fries). Fruits: Fruit canned in syrup. Meats and other protein foods: Beef. Pork. Lamb. Poultry with skin. Hot dogs. [place] Salines. Dairy: Ice cream. Sour cream. Whole milk. Beverages: Juice. Sugar-sweetened soft drinks. Beer. Liquor and spirits. Fats and oils: Butter. Canola oil. Vegetable oil. Beef fat (tallow). Lard. Sweets and desserts: Cookies. Cakes. Pies. Candy. Seasoning and other foods: Mayonnaise. Premade sauces and marinades. The items listed may not be a complete list. Talk with  your dietitian aboutwhat dietary choices are right for you. Summary The Mediterranean diet includes both food and lifestyle choices. Eat a variety of fresh fruits and vegetables, beans, nuts, seeds, and whole grains. Limit the amount of red meat and sweets that you eat. Talk with your health care provider about whether it is safe for you to drink red wine in moderation. This means 1 glass a day for nonpregnant women and 2 glasses a day for men. A glass of wine equals 5 oz (150 mL). This information is not intended to replace advice given to you by your health care provider. Make sure you discuss any questions you have with your healthcare provider. Document Revised: 02/14/2016 Document Reviewed: 02/07/2016 Elsevier Patient Education  [place].

## 2021-02-27 ENCOUNTER — Other Ambulatory Visit: Payer: Self-pay

## 2021-02-27 DIAGNOSIS — F32A Depression, unspecified: Secondary | ICD-10-CM | POA: Insufficient documentation

## 2021-02-27 DIAGNOSIS — E559 Vitamin D deficiency, unspecified: Secondary | ICD-10-CM | POA: Insufficient documentation

## 2021-02-27 DIAGNOSIS — K047 Periapical abscess without sinus: Secondary | ICD-10-CM | POA: Insufficient documentation

## 2021-02-27 DIAGNOSIS — E6609 Other obesity due to excess calories: Secondary | ICD-10-CM | POA: Insufficient documentation

## 2021-02-27 DIAGNOSIS — F5104 Psychophysiologic insomnia: Secondary | ICD-10-CM | POA: Insufficient documentation

## 2021-02-27 DIAGNOSIS — Z6835 Body mass index (BMI) 35.0-35.9, adult: Secondary | ICD-10-CM | POA: Insufficient documentation

## 2021-02-27 DIAGNOSIS — E66812 Obesity, class 2: Secondary | ICD-10-CM | POA: Insufficient documentation

## 2021-02-27 DIAGNOSIS — K760 Fatty (change of) liver, not elsewhere classified: Secondary | ICD-10-CM | POA: Insufficient documentation

## 2021-02-27 LAB — COMP. METABOLIC PANEL (12)
AST: 19 IU/L (ref 0–40)
Albumin/Globulin Ratio: 1.5 (ref 1.2–2.2)
Albumin: 4.5 g/dL (ref 4.0–5.0)
Alkaline Phosphatase: 83 IU/L (ref 44–121)
BUN/Creatinine Ratio: 5 — ABNORMAL LOW (ref 9–20)
BUN: 6 mg/dL (ref 6–24)
Bilirubin Total: 0.4 mg/dL (ref 0.0–1.2)
Calcium: 9.2 mg/dL (ref 8.7–10.2)
Chloride: 102 mmol/L (ref 96–106)
Creatinine, Ser: 1.14 mg/dL (ref 0.76–1.27)
Globulin, Total: 3.1 g/dL (ref 1.5–4.5)
Glucose: 92 mg/dL (ref 65–99)
Potassium: 4.4 mmol/L (ref 3.5–5.2)
Sodium: 142 mmol/L (ref 134–144)
Total Protein: 7.6 g/dL (ref 6.0–8.5)
eGFR: 81 mL/min/{1.73_m2} (ref >59)

## 2021-02-27 LAB — VITAMIN B12: Vitamin B-12: 236 pg/mL (ref 232–1245)

## 2021-02-27 LAB — VITAMIN D 25 HYDROXY (VIT D DEFICIENCY, FRACTURES): Vit D, 25-Hydroxy: 10.6 ng/mL — ABNORMAL LOW (ref 30.0–100.0)

## 2021-02-27 MED ORDER — VITAMIN D (ERGOCALCIFEROL) 1.25 MG (50000 UNIT) PO CAPS
ORAL_CAPSULE | ORAL | 2 refills | Status: AC
  Filled 2021-02-27: qty 4, 28d supply, fill #0

## 2021-02-27 NOTE — Addendum Note (Signed)
Addended by: Kennieth Rad on: 02/27/2021 09:40 AM   Modules accepted: Orders

## 2021-03-05 ENCOUNTER — Telehealth: Payer: Self-pay | Admitting: Clinical

## 2021-03-06 ENCOUNTER — Other Ambulatory Visit: Payer: Self-pay

## 2021-03-06 ENCOUNTER — Telehealth: Payer: Self-pay | Admitting: *Deleted

## 2021-03-06 NOTE — Telephone Encounter (Signed)
Medical Assistant left message on patient's home and cell voicemail. Voicemail states to give a call back to Kady Toothaker with [facility] at [phone]. 

## 2021-03-06 NOTE — Telephone Encounter (Signed)
-----   Message from Kennieth Rad, [place] sent at 02/27/2021  9:40 AM EDT ----- Please call patient and let him know that his kidney and liver function are within normal limits, he does show signs of dehydration.  His vitamin D is still low, he needs to do another course of 50,000 units once a week for at least 12 weeks.  He should have this rechecked after the 12-week treatment.  Prescription sent to his pharmacy.

## 2021-03-08 NOTE — Telephone Encounter (Signed)
Second attempt to reach pt, no answer, left vm

## 2021-03-15 NOTE — Telephone Encounter (Signed)
Appt scheduled with pt foe 04/09/21 at 9:00am.

## 2021-03-27 ENCOUNTER — Ambulatory Visit: Payer: Self-pay

## 2021-03-27 ENCOUNTER — Other Ambulatory Visit: Payer: Self-pay

## 2021-04-01 ENCOUNTER — Ambulatory Visit: Payer: Self-pay | Admitting: Family Medicine

## 2021-04-03 ENCOUNTER — Ambulatory Visit: Payer: Self-pay | Attending: Nurse Practitioner

## 2021-04-03 ENCOUNTER — Other Ambulatory Visit: Payer: Self-pay

## 2021-04-09 ENCOUNTER — Institutional Professional Consult (permissible substitution): Payer: Self-pay | Admitting: Clinical

## 2021-04-23 ENCOUNTER — Institutional Professional Consult (permissible substitution): Payer: Self-pay | Admitting: Clinical

## 2021-04-23 ENCOUNTER — Ambulatory Visit: Payer: Self-pay | Admitting: Family Medicine

## 2021-05-01 ENCOUNTER — Other Ambulatory Visit: Payer: Self-pay | Admitting: Nurse Practitioner

## 2021-05-01 ENCOUNTER — Other Ambulatory Visit: Payer: Self-pay

## 2021-05-01 DIAGNOSIS — L301 Dyshidrosis [pompholyx]: Secondary | ICD-10-CM

## 2021-05-01 DIAGNOSIS — J4520 Mild intermittent asthma, uncomplicated: Secondary | ICD-10-CM

## 2021-05-01 MED ORDER — TRIAMCINOLONE ACETONIDE 0.1 % EX CREA
1.0000 "application " | TOPICAL_CREAM | Freq: Two times a day (BID) | CUTANEOUS | 0 refills | Status: DC
  Filled 2021-05-01: qty 30, 15d supply, fill #0

## 2021-05-01 MED ORDER — ALBUTEROL SULFATE HFA 108 (90 BASE) MCG/ACT IN AERS
INHALATION_SPRAY | RESPIRATORY_TRACT | 1 refills | Status: DC
  Filled 2021-05-01: qty 18, 25d supply, fill #0

## 2021-05-09 ENCOUNTER — Inpatient Hospital Stay (HOSPITAL_COMMUNITY)
Admission: EM | Admit: 2021-05-09 | Discharge: 2021-05-12 | DRG: 202 | Disposition: A | Discharge status: Home / Self Care | Payer: Self-pay | Attending: Family Medicine | Admitting: Family Medicine

## 2021-05-09 ENCOUNTER — Emergency Department (HOSPITAL_COMMUNITY): Payer: Self-pay

## 2021-05-09 DIAGNOSIS — Z825 Family history of asthma and other chronic lower respiratory diseases: Secondary | ICD-10-CM

## 2021-05-09 DIAGNOSIS — Z79899 Other long term (current) drug therapy: Secondary | ICD-10-CM

## 2021-05-09 DIAGNOSIS — Z823 Family history of stroke: Secondary | ICD-10-CM

## 2021-05-09 DIAGNOSIS — R61 Generalized hyperhidrosis: Secondary | ICD-10-CM | POA: Diagnosis present

## 2021-05-09 DIAGNOSIS — Z91014 Allergy to mammalian meats: Secondary | ICD-10-CM

## 2021-05-09 DIAGNOSIS — N179 Acute kidney failure, unspecified: Secondary | ICD-10-CM | POA: Diagnosis present

## 2021-05-09 DIAGNOSIS — Z91013 Allergy to seafood: Secondary | ICD-10-CM

## 2021-05-09 DIAGNOSIS — Z7722 Contact with and (suspected) exposure to environmental tobacco smoke (acute) (chronic): Secondary | ICD-10-CM | POA: Diagnosis present

## 2021-05-09 DIAGNOSIS — Z91040 Latex allergy status: Secondary | ICD-10-CM

## 2021-05-09 DIAGNOSIS — F32A Depression, unspecified: Secondary | ICD-10-CM | POA: Diagnosis present

## 2021-05-09 DIAGNOSIS — R0603 Acute respiratory distress: Secondary | ICD-10-CM

## 2021-05-09 DIAGNOSIS — B9789 Other viral agents as the cause of diseases classified elsewhere: Secondary | ICD-10-CM | POA: Diagnosis present

## 2021-05-09 DIAGNOSIS — J4521 Mild intermittent asthma with (acute) exacerbation: Secondary | ICD-10-CM | POA: Diagnosis present

## 2021-05-09 DIAGNOSIS — J4552 Severe persistent asthma with status asthmaticus: Principal | ICD-10-CM | POA: Insufficient documentation

## 2021-05-09 DIAGNOSIS — Z832 Family history of diseases of the blood and blood-forming organs and certain disorders involving the immune mechanism: Secondary | ICD-10-CM

## 2021-05-09 DIAGNOSIS — Z833 Family history of diabetes mellitus: Secondary | ICD-10-CM

## 2021-05-09 DIAGNOSIS — Z20822 Contact with and (suspected) exposure to covid-19: Secondary | ICD-10-CM | POA: Diagnosis present

## 2021-05-09 DIAGNOSIS — K219 Gastro-esophageal reflux disease without esophagitis: Secondary | ICD-10-CM | POA: Diagnosis present

## 2021-05-09 DIAGNOSIS — B9710 Unspecified enterovirus as the cause of diseases classified elsewhere: Secondary | ICD-10-CM | POA: Diagnosis present

## 2021-05-09 DIAGNOSIS — Z803 Family history of malignant neoplasm of breast: Secondary | ICD-10-CM

## 2021-05-09 DIAGNOSIS — F419 Anxiety disorder, unspecified: Secondary | ICD-10-CM | POA: Diagnosis present

## 2021-05-09 DIAGNOSIS — E872 Acidosis, unspecified: Secondary | ICD-10-CM | POA: Diagnosis present

## 2021-05-09 DIAGNOSIS — J4520 Mild intermittent asthma, uncomplicated: Secondary | ICD-10-CM

## 2021-05-09 DIAGNOSIS — Z91018 Allergy to other foods: Secondary | ICD-10-CM

## 2021-05-09 DIAGNOSIS — Z8249 Family history of ischemic heart disease and other diseases of the circulatory system: Secondary | ICD-10-CM

## 2021-05-09 DIAGNOSIS — J9601 Acute respiratory failure with hypoxia: Secondary | ICD-10-CM | POA: Diagnosis present

## 2021-05-09 DIAGNOSIS — Z888 Allergy status to other drugs, medicaments and biological substances status: Secondary | ICD-10-CM

## 2021-05-09 DIAGNOSIS — Z8042 Family history of malignant neoplasm of prostate: Secondary | ICD-10-CM

## 2021-05-09 DIAGNOSIS — J9602 Acute respiratory failure with hypercapnia: Secondary | ICD-10-CM | POA: Diagnosis present

## 2021-05-09 DIAGNOSIS — E876 Hypokalemia: Secondary | ICD-10-CM | POA: Diagnosis present

## 2021-05-09 LAB — CBC WITH DIFFERENTIAL/PLATELET
Abs Immature Granulocytes: 0.05 10*3/uL (ref 0.00–0.07)
Basophils Absolute: 0 10*3/uL (ref 0.0–0.1)
Basophils Relative: 0 %
Eosinophils Absolute: 0.3 10*3/uL (ref 0.0–0.5)
Eosinophils Relative: 2 %
HCT: 44.6 % (ref 39.0–52.0)
Hemoglobin: 14.6 g/dL (ref 13.0–17.0)
Immature Granulocytes: 0 %
Lymphocytes Relative: 17 %
Lymphs Abs: 2.5 10*3/uL (ref 0.7–4.0)
MCH: 27.1 pg (ref 26.0–34.0)
MCHC: 32.7 g/dL (ref 30.0–36.0)
MCV: 82.7 fL (ref 80.0–100.0)
Monocytes Absolute: 1.5 10*3/uL — ABNORMAL HIGH (ref 0.1–1.0)
Monocytes Relative: 11 %
Neutro Abs: 9.8 10*3/uL — ABNORMAL HIGH (ref 1.7–7.7)
Neutrophils Relative %: 70 %
Platelets: 280 10*3/uL (ref 150–400)
RBC: 5.39 MIL/uL (ref 4.22–5.81)
RDW: 14.5 % (ref 11.5–15.5)
WBC: 14.2 10*3/uL — ABNORMAL HIGH (ref 4.0–10.5)
nRBC: 0 % (ref 0.0–0.2)

## 2021-05-09 LAB — RESPIRATORY PANEL BY PCR

## 2021-05-09 LAB — RESP PANEL BY RT-PCR (FLU A&B, COVID) ARPGX2
Influenza A by PCR: NEGATIVE
Influenza B by PCR: NEGATIVE
SARS Coronavirus 2 by RT PCR: NEGATIVE

## 2021-05-09 LAB — I-STAT VENOUS BLOOD GAS, ED
Acid-base deficit: 1 mmol/L (ref 0.0–2.0)
Bicarbonate: 25.6 mmol/L (ref 20.0–28.0)
Calcium, Ion: 1.17 mmol/L (ref 1.15–1.40)
HCT: 46 % (ref 39.0–52.0)
Hemoglobin: 15.6 g/dL (ref 13.0–17.0)
O2 Saturation: 82 %
Potassium: 3.4 mmol/L — ABNORMAL LOW (ref 3.5–5.1)
Sodium: 141 mmol/L (ref 135–145)
TCO2: 27 mmol/L (ref 22–32)
pCO2, Ven: 47.5 mmHg (ref 44.0–60.0)
pH, Ven: 7.339 (ref 7.250–7.430)
pO2, Ven: 50 mmHg — ABNORMAL HIGH (ref 32.0–45.0)

## 2021-05-09 LAB — BASIC METABOLIC PANEL
Anion gap: 10 (ref 5–15)
BUN: 5 mg/dL — ABNORMAL LOW (ref 6–20)
CO2: 25 mmol/L (ref 22–32)
Calcium: 9.2 mg/dL (ref 8.9–10.3)
Chloride: 104 mmol/L (ref 98–111)
Creatinine, Ser: 1.36 mg/dL — ABNORMAL HIGH (ref 0.61–1.24)
GFR, Estimated: >60 mL/min (ref >60)
Glucose, Bld: 118 mg/dL — ABNORMAL HIGH (ref 70–99)
Potassium: 3.4 mmol/L — ABNORMAL LOW (ref 3.5–5.1)
Sodium: 139 mmol/L (ref 135–145)

## 2021-05-09 LAB — TROPONIN I (HIGH SENSITIVITY)
Troponin I (High Sensitivity): 8 ng/L (ref <18)
Troponin I (High Sensitivity): 7 ng/L (ref <18)
Troponin I (High Sensitivity): 7 ng/L (ref <18)

## 2021-05-09 LAB — GLUCOSE, CAPILLARY
Glucose-Capillary: 162 mg/dL — ABNORMAL HIGH (ref 70–99)
Glucose-Capillary: 147 mg/dL — ABNORMAL HIGH (ref 70–99)

## 2021-05-09 LAB — BRAIN NATRIURETIC PEPTIDE: B Natriuretic Peptide: 8.3 pg/mL (ref 0.0–100.0)

## 2021-05-09 LAB — CORTISOL: Cortisol, Plasma: 11.9 ug/dL

## 2021-05-09 LAB — HIV ANTIBODY (ROUTINE TESTING W REFLEX): HIV Screen 4th Generation wRfx: NONREACTIVE

## 2021-05-09 LAB — LACTIC ACID, PLASMA: Lactic Acid, Venous: 5.3 mmol/L (ref 0.5–1.9)

## 2021-05-09 MED ORDER — MAGNESIUM SULFATE 2 GM/50ML IV SOLN
2.0000 g | Freq: Once | INTRAVENOUS | Status: AC
  Administered 2021-05-09: 2 g via INTRAVENOUS
  Filled 2021-05-09: qty 50

## 2021-05-09 MED ORDER — LORAZEPAM 2 MG/ML IJ SOLN
0.5000 mg | Freq: Once | INTRAMUSCULAR | Status: DC
  Filled 2021-05-09: qty 1

## 2021-05-09 MED ORDER — PANTOPRAZOLE SODIUM 40 MG PO TBEC
40.0000 mg | DELAYED_RELEASE_TABLET | Freq: Two times a day (BID) | ORAL | Status: DC
  Administered 2021-05-10: 40 mg via ORAL
  Filled 2021-05-09: qty 1

## 2021-05-09 MED ORDER — ALBUTEROL SULFATE (2.5 MG/3ML) 0.083% IN NEBU
7.5000 mg/h | INHALATION_SOLUTION | RESPIRATORY_TRACT | Status: DC
  Administered 2021-05-09: 7.5 mg/h via RESPIRATORY_TRACT
  Filled 2021-05-09: qty 9

## 2021-05-09 MED ORDER — METHYLPREDNISOLONE SODIUM SUCC 125 MG IJ SOLR: 125.0000 mg | Freq: Once | INTRAMUSCULAR | Status: DC

## 2021-05-09 MED ORDER — ARFORMOTEROL TARTRATE 15 MCG/2ML IN NEBU
15.0000 ug | INHALATION_SOLUTION | Freq: Two times a day (BID) | RESPIRATORY_TRACT | Status: DC
  Administered 2021-05-09 – 2021-05-10 (×3): 15 ug via RESPIRATORY_TRACT
  Filled 2021-05-09 (×4): qty 2

## 2021-05-09 MED ORDER — DEXTROSE IN LACTATED RINGERS 5 % IV SOLN: INTRAVENOUS | Status: DC

## 2021-05-09 MED ORDER — ALBUTEROL SULFATE (2.5 MG/3ML) 0.083% IN NEBU
2.5000 mg | INHALATION_SOLUTION | RESPIRATORY_TRACT | Status: DC
  Administered 2021-05-09 (×2): 2.5 mg via RESPIRATORY_TRACT
  Filled 2021-05-09 (×2): qty 3

## 2021-05-09 MED ORDER — ALBUTEROL SULFATE (2.5 MG/3ML) 0.083% IN NEBU
2.5000 mg | INHALATION_SOLUTION | Freq: Once | RESPIRATORY_TRACT | Status: AC
  Administered 2021-05-09: 2.5 mg via RESPIRATORY_TRACT
  Filled 2021-05-09: qty 3

## 2021-05-09 MED ORDER — CHLORHEXIDINE GLUCONATE CLOTH 2 % EX PADS
6.0000 | MEDICATED_PAD | Freq: Every day | CUTANEOUS | Status: DC
  Administered 2021-05-09 – 2021-05-11 (×3): 6 via TOPICAL

## 2021-05-09 MED ORDER — ALBUTEROL SULFATE (2.5 MG/3ML) 0.083% IN NEBU
2.5000 mg | INHALATION_SOLUTION | Freq: Once | RESPIRATORY_TRACT | Status: DC
  Administered 2021-05-09: 2.5 mg via RESPIRATORY_TRACT
  Filled 2021-05-09: qty 3

## 2021-05-09 MED ORDER — HEPARIN SODIUM (PORCINE) 5000 UNIT/ML IJ SOLN
5000.0000 [IU] | Freq: Three times a day (TID) | INTRAMUSCULAR | Status: DC
  Filled 2021-05-09 (×2): qty 1

## 2021-05-09 MED ORDER — PREDNISONE 20 MG PO TABS: 60.0000 mg | ORAL_TABLET | Freq: Once | ORAL | Status: DC

## 2021-05-09 MED ORDER — MECLIZINE HCL 25 MG PO TABS
25.0000 mg | ORAL_TABLET | Freq: Three times a day (TID) | ORAL | Status: DC
  Administered 2021-05-10: 25 mg via ORAL
  Filled 2021-05-09 (×10): qty 1

## 2021-05-09 MED ORDER — POLYETHYLENE GLYCOL 3350 17 G PO PACK: 17.0000 g | PACK | Freq: Every day | ORAL | Status: DC | PRN

## 2021-05-09 MED ORDER — METHYLPREDNISOLONE SODIUM SUCC 125 MG IJ SOLR
125.0000 mg | Freq: Once | INTRAMUSCULAR | Status: AC
  Administered 2021-05-09: 125 mg via INTRAVENOUS
  Filled 2021-05-09: qty 2

## 2021-05-09 MED ORDER — DOCUSATE SODIUM 100 MG PO CAPS: 100.0000 mg | ORAL_CAPSULE | Freq: Two times a day (BID) | ORAL | Status: DC | PRN

## 2021-05-09 MED ORDER — METHYLPREDNISOLONE SODIUM SUCC 125 MG IJ SOLR
60.0000 mg | Freq: Four times a day (QID) | INTRAMUSCULAR | Status: DC
  Administered 2021-05-09 – 2021-05-10 (×3): 60 mg via INTRAVENOUS
  Filled 2021-05-09 (×3): qty 2

## 2021-05-09 MED ORDER — TRAZODONE HCL 50 MG PO TABS: 25.0000 mg | ORAL_TABLET | Freq: Every evening | ORAL | Status: DC | PRN

## 2021-05-09 MED ORDER — BUDESONIDE 0.5 MG/2ML IN SUSP
0.5000 mg | Freq: Two times a day (BID) | RESPIRATORY_TRACT | Status: DC
  Administered 2021-05-09 – 2021-05-10 (×3): 0.5 mg via RESPIRATORY_TRACT
  Filled 2021-05-09 (×4): qty 2

## 2021-05-09 MED ORDER — SODIUM CHLORIDE 0.9 % IV SOLN
500.0000 mg | INTRAVENOUS | Status: DC
  Administered 2021-05-09: 500 mg via INTRAVENOUS
  Filled 2021-05-09 (×2): qty 500

## 2021-05-09 MED ORDER — ALBUTEROL SULFATE (2.5 MG/3ML) 0.083% IN NEBU: 2.5000 mg | INHALATION_SOLUTION | RESPIRATORY_TRACT | Status: DC | PRN

## 2021-05-09 MED ORDER — HYDROXYZINE HCL 25 MG PO TABS
25.0000 mg | ORAL_TABLET | Freq: Three times a day (TID) | ORAL | Status: DC | PRN
  Administered 2021-05-10 – 2021-05-12 (×4): 25 mg via ORAL
  Filled 2021-05-09 (×4): qty 1

## 2021-05-09 MED ORDER — ONDANSETRON 4 MG PO TBDP: 4.0000 mg | ORAL_TABLET | Freq: Three times a day (TID) | ORAL | Status: DC | PRN

## 2021-05-09 NOTE — H&P (Signed)
NAME:  Adrian [name], MRN:  [id number], DOB:  Apr 03, 1976, LOS: 0 ADMISSION DATE:  05/09/2021, CONSULTATION DATE:  05/09/21 REFERRING MD:  Dina Rich , CHIEF COMPLAINT:  status asthmaticus    History of Present Illness:  This is a 45 year old male w/ h/o mild intermittent asthma. He is not currently on maintenance therapy and on a good week utilizes rescue ventolin about twice a week. Was in his usual state of health until about 1 week prior to admit when he started having increased nasal congestion, cough w/ clear sputum and increased wheezing/sob. Over the following days took repeated SABA initially w/ some relief then ~ 2 days prior to admit had URI type symptoms for about 24 hrs w/ additional sore throat as well. Since that time he required more freq SABA use to point that he ran out of his meds . He presented to the ED 11/10   presentation to ER was diaphoretic and in acute distress. He was placed on NIPPV, givien IV solumedrol and CA neb. PCCM asked to admit  Pertinent  Medical History  Asthma, Mild intermittent. GERD, environmental allergies, allergic rhinitis, depression, anxiety, eczema, heart murmur. Multiple food allergies   Significant Hospital Events: Including procedures, antibiotic start and stop dates in addition to other pertinent events     Interim History / Subjective:  11/10 admitted w/ status asthmaticus. Placed on BIPAP/steroids, BDs/ICS and azith. RVP sent.   Objective   Blood pressure (Abnormal) 133/113, pulse 95, temperature 98.3 F (36.8 C), temperature source Oral, resp. rate (Abnormal) 21, SpO2 100 %.    Vent Mode: PSV;BIPAP FiO2 (%):  [50 %] 50 % PEEP:  [5 cmH20] 5 cmH20 Pressure Support:  [5 cmH20] 5 cmH20   Intake/Output Summary (Last 24 hours) at 05/09/2021 1521 Last data filed at 05/09/2021 1340 Gross per 24 hour  Intake 47.23 ml  Output no documentation  Net 47.23 ml   There were no vitals filed for this visit.  Examination: General: 45 year old aam  sitting up in bed NAD HENT: NCAT no JVD  Lungs: diffuse wheezing. Decent airflow. Currently on BIPAP 5/5 sats 90s feels better Cardiovascular: RRR Abdomen: soft not tender  Extremities: warm and dry  Neuro: oriented. No focal def  GU: voids   Resolved Hospital Problem list     Assessment & Plan:  Acute respiratory failure 2/2 status asthmaticus.  -sxs improved w/ rx to date and tolerating NIPPV at this time CXR clear  Plan Cont BIPAP PRN Scheduled Brovana/budesonide and albuterol  PRN albuterol  Systemic steroids 60mg  q 6 PPI BID Azithromycin day 1 UDS Ck RVP  Cont home hydroxyzine. He takes this for anxiety but can also help w/ antihistamine effect.  Am cxr Ck lactate Admit to ICU    Mild AKI Plan IV hydration  Am cmp  Hypokalemia Plan Replace and recheck    Best Practice (right click and "Reselect all SmartList Selections" daily)   Diet/type: NPO w/ oral meds DVT prophylaxis: prophylactic heparin  GI prophylaxis: PPI Lines: N/A Foley:  N/A Code Status:  full code Last date of multidisciplinary goals of care discussion [pending ]  Labs   CBC: Recent Labs  Lab 05/09/21 1226 05/09/21 1233  WBC 14.2*  --   NEUTROABS 9.8*  --   HGB 14.6 15.6  HCT 44.6 46.0  MCV 82.7  --   PLT 280  --     Basic Metabolic Panel: Recent Labs  Lab 05/09/21 1226 05/09/21 1233  NA 139  141  K 3.4* 3.4*  CL 104  --   CO2 25  --   GLUCOSE 118*  --   BUN 5*  --   CREATININE 1.36*  --   CALCIUM 9.2  --    GFR: CrCl cannot be calculated (Unknown ideal weight.). Recent Labs  Lab 05/09/21 1226  WBC 14.2*    Liver Function Tests: No results for input(s): AST, ALT, ALKPHOS, BILITOT, PROT, ALBUMIN in the last 168 hours. No results for input(s): LIPASE, AMYLASE in the last 168 hours. No results for input(s): AMMONIA in the last 168 hours.  ABG    Component Value Date/Time   HCO3 25.6 05/09/2021 1233   TCO2 27 05/09/2021 1233   ACIDBASEDEF 1.0 05/09/2021 1233    O2SAT 82.0 05/09/2021 1233     Coagulation Profile: No results for input(s): INR, PROTIME in the last 168 hours.  Cardiac Enzymes: No results for input(s): CKTOTAL, CKMB, CKMBINDEX, TROPONINI in the last 168 hours.  HbA1C: Hgb A1c MFr Bld  Date/Time Value Ref Range Status  03/20/2020 12:00 PM 5.5 4.8 - 5.6 % Final    Comment:             Prediabetes: 5.7 - 6.4          Diabetes: >6.4          Glycemic control for adults with diabetes: <7.0     CBG: No results for input(s): GLUCAP in the last 168 hours.  Review of Systems:   Review of Systems  Constitutional:  Positive for diaphoresis and malaise/fatigue. Negative for fever.  HENT:  Positive for congestion, sinus pain and sore throat.   Eyes: Negative.   Respiratory:  Positive for cough, sputum production, shortness of breath and wheezing.   Cardiovascular: Negative.   Gastrointestinal:  Positive for heartburn.  Genitourinary: Negative.   Musculoskeletal: Negative.   Skin: Negative.   Neurological: Negative.   Endo/Heme/Allergies: Negative.   Psychiatric/Behavioral: Negative.      Past Medical History:  He,  has a past medical history of Asthma, Eczema, Environmental allergies, Foot fracture, left, Heart murmur, Migraine, and Multiple food allergies.   Surgical History:   Past Surgical History:  Procedure Laterality Date   INGUINAL HERNIA REPAIR  1994     Social History:   reports that he has never smoked. He has never used smokeless tobacco. He reports current drug use. Drug: Marijuana. He reports that he does not drink alcohol.   Family History:  His family history includes Asthma in his father; Breast cancer in an other family member; Clotting disorder in an other family member; Diabetes in his mother; Hyperlipidemia in an other family member; Hypertension in his mother and another family member; Kidney disease in an other family member; Prostate cancer in an other family member; Stroke in an other family  member.   Allergies Allergies  Allergen Reactions   Apple Shortness Of Breath, Itching, Nausea Only and Swelling    Tongue itches also   Atrovent [Ipratropium] Anaphylaxis    Atrovent contains peanut oil and PT has a HX of anaphlaxis to peanuts   Banana Shortness Of Breath and Itching    Makes tongue itch   Carrot Oil Shortness Of Breath and Itching   Fish Allergy Anaphylaxis, Hives and Swelling   Fruit & Vegetable Daily [Nutritional Supplements] Shortness Of Breath and Itching    Patient stated that he allergic to most all fruits   Peanut-Containing Drug Products Anaphylaxis, Hives, Nausea And Vomiting and Swelling  Shellfish Allergy Anaphylaxis, Hives, Swelling and Other (See Comments)    "ALL SEAFOOD"   Tomato Anaphylaxis, Hives and Swelling   Chicken Meat (Diagnostic) Other (See Comments)    Tested allergic to this years ago   Latex Itching    No powdered gloves!!   Pork-Derived Products Other (See Comments)    CAUSES SEVERE HEADACHES   Soy Allergy Other (See Comments)    Tested allergic to this years ago   Grass Extracts [Gramineae Pollens] Hives, Rash and Other (See Comments)    Wheezing also   Tree Extract Other (See Comments)    Patient tested allergic to this years ago     Home Medications  Prior to Admission medications   Medication Sig Start Date End Date Taking? Authorizing Provider  albuterol (VENTOLIN HFA) 108 (90 Base) MCG/ACT inhaler INHALE 2 PUFFS INTO THE LUNGS EVERY 6 (SIX) HOURS AS NEEDED FOR WHEEZING OR SHORTNESS OF BREATH (COUGH). 05/01/21 05/01/22  Mayers, Cari S, PA-C  fluticasone (FLOVENT HFA) 44 MCG/ACT inhaler Inhale 1 puff into the lungs 1 day or 1 dose for 1 dose. 03/01/20 03/02/20  Argentina Donovan, PA-C  hydrOXYzine (ATARAX/VISTARIL) 25 MG tablet TAKE 1 TABLET (25 MG TOTAL) BY MOUTH 3 (THREE) TIMES DAILY AS NEEDED. 02/26/21 02/26/22  Mayers, Cari S, PA-C  hydrOXYzine (ATARAX/VISTARIL) 25 MG tablet TAKE 1/2 TO 1 TABLET BY MOUTH EVERY 8 HOURS AS NEEDED  FOR ANXIETY. 02/26/21 02/26/22  Mayers, Cari S, PA-C  meclizine (ANTIVERT) 25 MG tablet Take 1 tablet (25 mg total) by mouth 3 (three) times daily. 03/01/20   Argentina Donovan, PA-C  ondansetron (ZOFRAN-ODT) 8 MG disintegrating tablet TAKE 1 TABLET (8 MG TOTAL) BY MOUTH EVERY 8 (EIGHT) HOURS AS NEEDED FOR NAUSEA OR VOMITING. 02/26/21 02/26/22  Mayers, Cari S, PA-C  pantoprazole (PROTONIX) 40 MG tablet TAKE 1 TABLET (40 MG TOTAL) BY MOUTH DAILY. 02/26/21 02/26/22  Mayers, Cari S, PA-C  traZODone (DESYREL) 50 MG tablet Take 0.5-1 tablets (25-50 mg total) by mouth at bedtime as needed for sleep. 02/26/21   Mayers, Cari S, PA-C  triamcinolone cream (KENALOG) 0.1 % Apply 1 application topically 2 (two) times daily. 05/01/21   Mayers, Cari S, PA-C  TRIAMCINOLONE PO Take by mouth.    [provider]  Vitamin D, Ergocalciferol, (DRISDOL) 1.25 MG (50000 UNIT) CAPS capsule TAKE 1 CAPSULE (50,000 UNITS TOTAL) BY MOUTH EVERY 7 (SEVEN) DAYS. 02/27/21 02/27/22  Mayers, Loraine Grip, PA-C     Critical care time: 32 min     Erick Colace ACNP-BC [place] Pager # [phone] OR # [phone] if no answer

## 2021-05-09 NOTE — ED Provider Notes (Addendum)
Emergency Medicine Provider Triage Evaluation Note  Adrian [name] , a 45 y.o. male  was evaluated in triage.  Pt complains of reports a history of asthma otherwise healthy presented today for shortness of breath.  Patient reports has been experiencing an asthma exacerbation for the past week he has run out of his inhaler at home.  He reports that he has had increasing shortness of breath over the past few days.  Review of Systems  Positive: Shortness of breath Negative: Chest pain, fever, chills, nausea, vomiting, abdominal pain or any additional concerns  Physical Exam  BP (!) 152/79 (BP Location: Left Arm)   Pulse 87   Temp 98.3 F (36.8 C) (Oral)   Resp (!) 24   SpO2 94%  Gen:   Awake, no distress   Resp:  Diffuse wheezing, mild tachypnea MSK:   Moves extremities without difficulty  Other:  Heart regular rate and rhythm  Medical Decision Making  Medically screening exam initiated at 10:10 AM.  Appropriate orders placed.  Adrian [name] was informed that the remainder of the evaluation will be completed by another provider, this initial triage assessment does not replace that evaluation, and the importance of remaining in the ED until their evaluation is complete.  Patient with what appears to be necessary exacerbation.  Asthma order set utilized, nebulizers ordered x3.  Patient has anaphylactic reaction to ipratropium so DuoNeb was not ordered.  I ordered Solu-Medrol to help with his symptoms however patient refused IV or blood work.  He will be given oral steroids instead.  Patient is fully alert and oriented and has mental capacity make his own medical decisions.  Patient aware that he will need to be seen in the main ED.  RN keeping patient in triage room for nebulizers x3 and continuous monitoring.  Note: Portions of this report may have been transcribed using voice recognition software. Every effort was made to ensure accuracy; however, inadvertent computerized transcription errors  may still be present.    Deliah Boston, PA-C 05/09/21 1012    Deliah Boston, PA-C 05/09/21 1012    Lennice Sites, DO 05/09/21 1047

## 2021-05-09 NOTE — ED Notes (Signed)
Pt refused lab work.

## 2021-05-09 NOTE — ED Provider Notes (Addendum)
[facility] EMERGENCY DEPARTMENT Provider Note   CSN: [id number] Arrival date & time: 05/09/21  0857    History Chief Complaint  Patient presents with   Asthma   Shortness of Breath    Adrian [name] is a 45 y.o. male with past medical history significant for asthma, migraine, murmur who presents for evaluation of shortness of breath.  Majority of history obtained from triage note as patient in respiratory distress on my initial evaluation  Sounds like he has had shortness of breath over the last week.  He has been out of his home inhalers.  Apparently had refused IV or blood work on initial evaluation.  Received Duoneb x2 prior to my assessment.   Anaphylaxis to Atrovent  Level 5 caveat-respiratory distress  HPI     Past Medical History:  Diagnosis Date   Asthma    at birth   Eczema    Environmental allergies    grass   Foot fracture, left    Heart murmur    Migraine    Multiple food allergies    fish, tomatoes, peanuts, eggs, chicken and others    Patient Active Problem List   Diagnosis Date Noted   AB (asthmatic bronchitis), mild intermittent, with acute exacerbation 05/09/2021   Severe persistent asthma with status asthmaticus    Fatty liver 02/27/2021   Class 2 obesity due to excess calories without serious comorbidity with body mass index (BMI) of 35.0 to 35.9 in adult 02/27/2021   Anxiety and depression 02/27/2021   Dental infection 02/27/2021   Vitamin D deficiency 02/27/2021   Psychophysiological insomnia 02/27/2021   Generalized abdominal pain 10/07/2020   Epigastric pain 08/04/2020   Bradycardia 08/04/2020   Anxiety state 08/04/2020   Gastroesophageal reflux disease without esophagitis 08/04/2020   Nausea 07/27/2020   Asthma in adult, mild intermittent, uncomplicated 83/66/2947   Chronic migraine 09/13/2015   Allergic rhinitis 04/09/2015   Poor vision 04/09/2015   Bilateral wrist pain 12/15/2014   Pain, dental 05/19/2012    Lipoma 01/25/2012   Asthma in adult 01/13/2012   Eczema, dyshidrotic 01/13/2012    Past Surgical History:  Procedure Laterality Date   INGUINAL HERNIA REPAIR  1994       Family History  Problem Relation Age of Onset   Asthma Father    Diabetes Mother    Hypertension Mother    Clotting disorder Other        Aunt   Breast cancer Other        Grandmother   Prostate cancer Other        uncle   Hyperlipidemia Other        grandparent   Hypertension Other        grandparent/grandparent   Kidney disease Other        aunt/uncle/grandparent   Stroke Other        grandparent    Social History   Tobacco Use   Smoking status: Never   Smokeless tobacco: Never  Vaping Use   Vaping Use: Never used  Substance Use Topics   Alcohol use: Never   Drug use: Yes    Types: Marijuana    Home Medications Prior to Admission medications   Medication Sig Start Date End Date Taking? Authorizing Provider  albuterol (VENTOLIN HFA) 108 (90 Base) MCG/ACT inhaler INHALE 2 PUFFS INTO THE LUNGS EVERY 6 (SIX) HOURS AS NEEDED FOR WHEEZING OR SHORTNESS OF BREATH (COUGH). 05/01/21 05/01/22  Mayers, Cari S, PA-C  fluticasone (FLOVENT HFA)  44 MCG/ACT inhaler Inhale 1 puff into the lungs 1 day or 1 dose for 1 dose. 03/01/20 03/02/20  Argentina Donovan, PA-C  hydrOXYzine (ATARAX/VISTARIL) 25 MG tablet TAKE 1 TABLET (25 MG TOTAL) BY MOUTH 3 (THREE) TIMES DAILY AS NEEDED. 02/26/21 02/26/22  Mayers, Cari S, PA-C  hydrOXYzine (ATARAX/VISTARIL) 25 MG tablet TAKE 1/2 TO 1 TABLET BY MOUTH EVERY 8 HOURS AS NEEDED FOR ANXIETY. 02/26/21 02/26/22  Mayers, Cari S, PA-C  meclizine (ANTIVERT) 25 MG tablet Take 1 tablet (25 mg total) by mouth 3 (three) times daily. 03/01/20   Argentina Donovan, PA-C  ondansetron (ZOFRAN-ODT) 8 MG disintegrating tablet TAKE 1 TABLET (8 MG TOTAL) BY MOUTH EVERY 8 (EIGHT) HOURS AS NEEDED FOR NAUSEA OR VOMITING. 02/26/21 02/26/22  Mayers, Cari S, PA-C  pantoprazole (PROTONIX) 40 MG tablet TAKE 1  TABLET (40 MG TOTAL) BY MOUTH DAILY. 02/26/21 02/26/22  Mayers, Cari S, PA-C  traZODone (DESYREL) 50 MG tablet Take 0.5-1 tablets (25-50 mg total) by mouth at bedtime as needed for sleep. 02/26/21   Mayers, Cari S, PA-C  triamcinolone cream (KENALOG) 0.1 % Apply 1 application topically 2 (two) times daily. 05/01/21   Mayers, Cari S, PA-C  TRIAMCINOLONE PO Take by mouth.    [provider]  Vitamin D, Ergocalciferol, (DRISDOL) 1.25 MG (50000 UNIT) CAPS capsule TAKE 1 CAPSULE (50,000 UNITS TOTAL) BY MOUTH EVERY 7 (SEVEN) DAYS. 02/27/21 02/27/22  Mayers, Cari S, PA-C    Allergies    Apple, Atrovent [ipratropium], Banana, Carrot oil, Fish allergy, Fruit & vegetable daily [nutritional supplements], Peanut-containing drug products, Shellfish allergy, Tomato, Chicken meat (diagnostic), Latex, Pork-derived products, Soy allergy, Grass extracts [gramineae pollens], and Tree extract  Review of Systems   Review of Systems  Unable to perform ROS: Severe respiratory distress  Constitutional: Negative.   Respiratory:  Positive for cough and shortness of breath.   Cardiovascular: Negative.   Gastrointestinal: Negative.   Musculoskeletal: Negative.   All other systems reviewed and are negative.  Physical Exam Updated Vital Signs BP (!) 133/113   Pulse 95   Temp 98.3 F (36.8 C) (Oral)   Resp (!) 21   SpO2 100%   Physical Exam Vitals and nursing note reviewed.  Constitutional:      General: He is in acute distress.     Appearance: He is well-developed. He is diaphoretic. He is not ill-appearing or toxic-appearing.     Comments: Acute distress, tripoding standing over bed  HENT:     Head: Normocephalic and atraumatic.  Eyes:     Pupils: Pupils are equal, round, and reactive to light.  Cardiovascular:     Rate and Rhythm: Normal rate and regular rhythm.     Pulses:          Radial pulses are 2+ on the right side and 2+ on the left side.     Heart sounds: Normal heart sounds.  Pulmonary:      Effort: Tachypnea, accessory muscle usage, prolonged expiration and retractions present.     Breath sounds: Decreased air movement present. Decreased breath sounds and wheezing present.     Comments: Very poor air movement.  Faint expiratory wheeze Abdominal:     General: Bowel sounds are normal. There is no distension.     Palpations: Abdomen is soft.     Tenderness: There is no abdominal tenderness.     Comments: Soft, nontender  Musculoskeletal:        General: Normal range of motion.  Cervical back: Normal range of motion and neck supple.     Comments: No bony tenderness.  Moves all 4 extremities at difficulty.  Compartments soft.  Homans' sign  Skin:    General: Skin is warm.  Neurological:     General: No focal deficit present.     Mental Status: He is alert and oriented to person, place, and time.   ED Results / Procedures / Treatments   Labs (all labs ordered are listed, but only abnormal results are displayed) Labs Reviewed  BASIC METABOLIC PANEL - Abnormal; Notable for the following components:      Result Value   Potassium 3.4 (*)    Glucose, Bld 118 (*)    BUN 5 (*)    Creatinine, Ser 1.36 (*)    All other components within normal limits  CBC WITH DIFFERENTIAL/PLATELET - Abnormal; Notable for the following components:   WBC 14.2 (*)    Neutro Abs 9.8 (*)    Monocytes Absolute 1.5 (*)    All other components within normal limits  I-STAT VENOUS BLOOD GAS, ED - Abnormal; Notable for the following components:   pO2, Ven 50.0 (*)    Potassium 3.4 (*)    All other components within normal limits  RESP PANEL BY RT-PCR (FLU A&B, COVID) ARPGX2  RESPIRATORY PANEL BY PCR  HIV ANTIBODY (ROUTINE TESTING W REFLEX)  LACTIC ACID, PLASMA  LACTIC ACID, PLASMA  CORTISOL  BRAIN NATRIURETIC PEPTIDE  LEGIONELLA PNEUMOPHILA SEROGP 1 UR AG  STREP PNEUMONIAE URINARY ANTIGEN  RAPID URINE DRUG SCREEN, HOSP PERFORMED  TROPONIN I (HIGH SENSITIVITY)     EKG None  Radiology DG Chest Port 1 View  Result Date: 05/09/2021 CLINICAL DATA:  Shortness of breath, wheezing EXAM: PORTABLE CHEST 1 VIEW COMPARISON:  09/03/2019 FINDINGS: The heart size and mediastinal contours are within normal limits. Both lungs are clear. The visualized skeletal structures are unremarkable. IMPRESSION: No active disease. Electronically Signed   By: Elmer Picker M.D.   On: 05/09/2021 10:10    Procedures .Critical Care Performed by: Nettie Elm, PA-C Authorized by: Nettie Elm, PA-C   Critical care provider statement:    Critical care time (minutes):  45   Critical care was necessary to treat or prevent imminent or life-threatening deterioration of the following conditions:  Respiratory failure   Critical care was time spent personally by me on the following activities:  Blood draw for specimens, development of treatment plan with patient or surrogate, discussions with consultants, discussions with primary provider, evaluation of patient's response to treatment, examination of patient, obtaining history from patient or surrogate, re-evaluation of patient's condition, review of old charts, pulse oximetry, ordering and review of radiographic studies, ordering and review of laboratory studies and ordering and performing treatments and interventions   Medications Ordered in ED Medications  hydrOXYzine (ATARAX/VISTARIL) tablet 25 mg (has no administration in time range)  traZODone (DESYREL) tablet 25-50 mg (has no administration in time range)  meclizine (ANTIVERT) tablet 25 mg (has no administration in time range)  ondansetron (ZOFRAN-ODT) disintegrating tablet 4 mg (has no administration in time range)  pantoprazole (PROTONIX) EC tablet 40 mg (has no administration in time range)  docusate sodium (COLACE) capsule 100 mg (has no administration in time range)  polyethylene glycol (MIRALAX / GLYCOLAX) packet 17 g (has no administration in time  range)  heparin injection 5,000 Units (has no administration in time range)  dextrose 5 % in lactated ringers infusion (has no administration in time range)  albuterol (PROVENTIL) (2.5 MG/3ML) 0.083% nebulizer solution 2.5 mg (has no administration in time range)  albuterol (PROVENTIL) (2.5 MG/3ML) 0.083% nebulizer solution 2.5 mg (has no administration in time range)  arformoterol (BROVANA) nebulizer solution 15 mcg (has no administration in time range)  budesonide (PULMICORT) nebulizer solution 0.5 mg (has no administration in time range)  azithromycin (ZITHROMAX) 500 mg in sodium chloride 0.9 % 250 mL IVPB (500 mg Intravenous New Bag/Given 05/09/21 1546)  methylPREDNISolone sodium succinate (SOLU-MEDROL) 125 mg/2 mL injection 60 mg (has no administration in time range)  albuterol (PROVENTIL) (2.5 MG/3ML) 0.083% nebulizer solution 2.5 mg (2.5 mg Nebulization Given 05/09/21 0925)  albuterol (PROVENTIL) (2.5 MG/3ML) 0.083% nebulizer solution 2.5 mg (2.5 mg Nebulization Given 05/09/21 0944)  methylPREDNISolone sodium succinate (SOLU-MEDROL) 125 mg/2 mL injection 125 mg (125 mg Intravenous Given 05/09/21 1231)  magnesium sulfate IVPB 2 g 50 mL (0 g Intravenous Stopped 05/09/21 1340)   ED Course  I have reviewed the triage vital signs and the nursing notes.  Pertinent labs & imaging results that were available during my care of the patient were reviewed by me and considered in my medical decision making (see chart for details).  Patient here for evaluation of shortness of breath/asthma exacerbation.  Received 2 Nebs prior to my assessment in triage.  Unfortunately initially refused labs and IV steroids.  On my initial assessment patient in acute respiratory distress.  He is tripoding over the bed and not moving much air.  Does have some tight chest with expiratory wheeze.  Respiratory called for continuous neb as well as BiPAP. He is now agreeable to steroids, labs as well as magnesium.  Clinical  evidence of VTE on exam  Patient initially refusing BPAP.  Getting continuous neb  Patient reassessed.  He is diaphoretic, has worsening shortness of breath.  He is mentating appropriately.  Attending and I at bedside.  Still refusing BiPAP, discussed intubation given his severe respiratory distress.  After much convincing with patient and family patient now agreeable for BiPap  Labs and imaging personally reviewed and interpreted:  CBC leukocytosis at 86.7 Metabolic panel creatinine 1.36, potassium 3.4 COVID, flu negative VBG pH 7.3, PCO2 47, bicarb 25 Chest x-ray without infiltrates, cardiomegaly, pulm edema, pneumothorax EKG without ischemic changes  Reassessed.  On BiPAP, still not moving much air.  He is getting continuous nebulizer through BiPAP  Reassessment. Patient appear more comfortable on BiPap has some better air movement however still continues have inspiratory, expiratory wheeze with prolonged expiration.  He has already received 3 x albuterol nebulizers, albuterol continuous nebulizer as well as magnesium 2 g.  Patient critically ill with acute hypoxic respiratory failure, suspect respiratory distress from asthma exacerbation.  Currently requiring BiPAP in ED.  He has normal mentation.  No emesis.  At this time I have low suspicion for acute ACS, PE, dissection, infectious process, angioedema, epiglottitis as cause of his respiratory failure.  Will need to be admitted to ICU for further management.  CONSULT with PCCM who will assess patient at bedside for admission   Patient seen eval by attending, Dr. Dina Rich who agrees with above treatment, plan and disposition  The patient appears reasonably stabilized for admission considering the current resources, flow, and capabilities available in the ED at this time, and I doubt any other [facility] requiring further screening and/or treatment in the ED prior to admission.      MDM Rules/Calculators/A&P  Final Clinical Impression(s) / ED Diagnoses Final diagnoses:  Respiratory distress  Severe persistent asthma with status asthmaticus    Rx / DC Orders ED Discharge Orders     None        Evoleth Nordmeyer A, PA-C 05/09/21 1557    Barnell Shieh A, PA-C 05/09/21 [place], [place], DO 05/15/21 1417

## 2021-05-09 NOTE — Progress Notes (Signed)
eLink Physician-Brief Progress Note Patient Name: Adrian [name] DOB: 1976-05-02 MRN: [id number]   Date of Service  05/09/2021  HPI/Events of Note  Lactic acid  level was 5.3 at 18:20 but patient is without clinical correlates of septic shock and his CXR was without acute disease, lactemia may have been due to increased work of breathing and hypoxemia as opposed to shock.  eICU Interventions  Will trend lactic acid to document resolution.        Kerry Kass Grizel Vesely 05/09/2021, 9:28 PM

## 2021-05-09 NOTE — ED Triage Notes (Signed)
Pt. Stated, Adrian [name] had this episode for about a week, My asthmas acting up , it will stop and came back.  Ive used all of my inhaler.

## 2021-05-09 NOTE — ED Notes (Signed)
CCM providers at bedside 

## 2021-05-09 NOTE — Progress Notes (Signed)
RT assisted with transportation of pt from ED16 to 3M07 while on BiPAP. Pt tolerated well with SVS and no complications. RN at bedside.

## 2021-05-09 NOTE — ED Notes (Signed)
Called lab to have BNP added. Per lab, they will add BNP to previous collection

## 2021-05-09 NOTE — ED Provider Notes (Signed)
I was the attending physician present during this patient's evaluation.  I participated in the history, exam taking and medical decision making.  Brief this is a 45 year old male with past medical history of asthma who presented with shortness of breath.  Patient had significant decreased aeration with inspiratory and expiratory wheezing.  On initial evaluation by the PA was noted to be in the tripod position.  Continuous nebulizers was attempted however patient declined.  It was necessary to transition patient to BiPAP.  He was difficult to keep on BiPAP and we came close to intubating the patient secondary to severe respiratory distress however we were able to calm the patient down and successfully transition him to BiPAP.  There was significant respiratory improvement in aeration.  Blood work is reassuring, mild AKI and leukocytosis. CXR shows no acute finding. Plan for admission to ICU due to significant respiratory distress and BiPAP status.  Patients evaluation and results requires admission for further treatment and care. Patient agrees with admission plan, offers no new complaints and is stable/unchanged at time of admit.  CRITICAL CARE Performed by: Alvin Critchley Uno Esau   Total critical care time: 45 minutes  Critical care time was exclusive of separately billable procedures and treating other patients.  Critical care was necessary to treat or prevent imminent or life-threatening deterioration.  Critical care was time spent personally by me on the following activities: development of treatment plan with patient and/or surrogate as well as nursing, discussions with consultants, evaluation of patient's response to treatment, examination of patient, obtaining history from patient or surrogate, ordering and performing treatments and interventions, ordering and review of laboratory studies, ordering and review of radiographic studies, pulse oximetry and re-evaluation of patient's condition.     Lorelle Gibbs, DO 05/09/21 1503

## 2021-05-10 DIAGNOSIS — J9601 Acute respiratory failure with hypoxia: Secondary | ICD-10-CM

## 2021-05-10 DIAGNOSIS — E872 Acidosis, unspecified: Secondary | ICD-10-CM

## 2021-05-10 DIAGNOSIS — J9602 Acute respiratory failure with hypercapnia: Secondary | ICD-10-CM

## 2021-05-10 DIAGNOSIS — J4521 Mild intermittent asthma with (acute) exacerbation: Secondary | ICD-10-CM

## 2021-05-10 LAB — BASIC METABOLIC PANEL
Anion gap: 11 (ref 5–15)
BUN: 7 mg/dL (ref 6–20)
CO2: 21 mmol/L — ABNORMAL LOW (ref 22–32)
Calcium: 9.1 mg/dL (ref 8.9–10.3)
Chloride: 104 mmol/L (ref 98–111)
Creatinine, Ser: 1.14 mg/dL (ref 0.61–1.24)
GFR, Estimated: >60 mL/min (ref >60)
Glucose, Bld: 136 mg/dL — ABNORMAL HIGH (ref 70–99)
Potassium: 4.3 mmol/L (ref 3.5–5.1)
Sodium: 136 mmol/L (ref 135–145)

## 2021-05-10 LAB — GLUCOSE, CAPILLARY
Glucose-Capillary: 120 mg/dL — ABNORMAL HIGH (ref 70–99)
Glucose-Capillary: 121 mg/dL — ABNORMAL HIGH (ref 70–99)
Glucose-Capillary: 126 mg/dL — ABNORMAL HIGH (ref 70–99)
Glucose-Capillary: 133 mg/dL — ABNORMAL HIGH (ref 70–99)
Glucose-Capillary: 137 mg/dL — ABNORMAL HIGH (ref 70–99)
Glucose-Capillary: 149 mg/dL — ABNORMAL HIGH (ref 70–99)
Glucose-Capillary: 185 mg/dL — ABNORMAL HIGH (ref 70–99)

## 2021-05-10 LAB — CBC
HCT: 42 % (ref 39.0–52.0)
Hemoglobin: 13.6 g/dL (ref 13.0–17.0)
MCH: 27 pg (ref 26.0–34.0)
MCHC: 32.4 g/dL (ref 30.0–36.0)
MCV: 83.3 fL (ref 80.0–100.0)
Platelets: DECREASED 10*3/uL (ref 150–400)
RBC: 5.04 MIL/uL (ref 4.22–5.81)
RDW: 14.6 % (ref 11.5–15.5)
WBC: 12 10*3/uL — ABNORMAL HIGH (ref 4.0–10.5)
nRBC: 0 % (ref 0.0–0.2)

## 2021-05-10 LAB — MRSA NEXT GEN BY PCR, NASAL: MRSA by PCR Next Gen: NOT DETECTED

## 2021-05-10 LAB — LACTIC ACID, PLASMA
Lactic Acid, Venous: 3.6 mmol/L (ref 0.5–1.9)
Lactic Acid, Venous: 2.7 mmol/L (ref 0.5–1.9)

## 2021-05-10 MED ORDER — PREDNISONE 10 MG PO TABS: 10.0000 mg | ORAL_TABLET | Freq: Every day | ORAL | Status: DC

## 2021-05-10 MED ORDER — PANTOPRAZOLE SODIUM 40 MG PO TBEC
40.0000 mg | DELAYED_RELEASE_TABLET | Freq: Every day | ORAL | Status: DC
  Administered 2021-05-11 – 2021-05-12 (×2): 40 mg via ORAL
  Filled 2021-05-10 (×2): qty 1

## 2021-05-10 MED ORDER — ALBUTEROL SULFATE (2.5 MG/3ML) 0.083% IN NEBU
2.5000 mg | INHALATION_SOLUTION | RESPIRATORY_TRACT | Status: DC
  Administered 2021-05-10 – 2021-05-11 (×5): 2.5 mg via RESPIRATORY_TRACT
  Filled 2021-05-10 (×5): qty 3

## 2021-05-10 MED ORDER — LACTATED RINGERS IV BOLUS
500.0000 mL | Freq: Once | INTRAVENOUS | Status: AC
  Administered 2021-05-10: 500 mL via INTRAVENOUS

## 2021-05-10 MED ORDER — WHITE PETROLATUM EX OINT
TOPICAL_OINTMENT | CUTANEOUS | Status: AC
  Filled 2021-05-10: qty 28.35

## 2021-05-10 NOTE — Progress Notes (Signed)
[facility] Progress Note Patient Name: Adrian [name] DOB: 1975-12-22 MRN: [id number]   Date of Service  05/10/2021  HPI/Events of Note  Lactic acid down to 3.6 from 5.3, most recent BNP was 8.0.  eICU Interventions  Will give patient a 500 cc LR bolus and repeat lactic acid at 5 AM.        Kerry Kass Akeira Lahm 05/10/2021, 2:31 AM

## 2021-05-10 NOTE — Progress Notes (Addendum)
NAME:  Adrian [name], MRN:  [id number], DOB:  Jun 22, 1976, LOS: 1 ADMISSION DATE:  05/09/2021, CONSULTATION DATE:  05/09/21 REFERRING MD:  [place] , CHIEF COMPLAINT:  status asthmaticus    History of Present Illness:  45 year old male with mild intermittent asthma admitted for status asthmaticus in the setting of rhinovirus/enterovirus viral infection for 1 week.  Ran out of SABA at home.  Presented to the ED 11/10 with respiratory distress, placed on BiPAP and given IV methylprednisolone and CAT.  PCCM asked to admit.  Pertinent  Medical History  Mild intermittent asthma GERD Allergic rhinitis Depression/anxiety Eczema Heart murmur Multiple food allergies  Significant Hospital Events: Including procedures, antibiotic start and stop dates in addition to other pertinent events   11/10 admitted w/ status asthmaticus. Placed on BIPAP/steroids, BDs/ICS and azith. RVP sent.   Interim History / Subjective:   Off BiPAP since 1900 yesterday Feels breathing has significantly improved Afebrile  Objective   Blood pressure 126/62, pulse 68, temperature 98.4 F (36.9 C), temperature source Oral, resp. rate (!) 22, SpO2 100 %.    Vent Mode: PSV;BIPAP FiO2 (%):  [40 %-50 %] 40 % PEEP:  [5 cmH20] 5 cmH20 Pressure Support:  [5 cmH20] 5 cmH20   Intake/Output Summary (Last 24 hours) at 05/10/2021 0806 Last data filed at 05/10/2021 0600 Gross per 24 hour  Intake 417.56 ml  Output --  Net 417.56 ml   There were no vitals filed for this visit.  Examination: General: Alert, laying in bed receiving nebulizer treatment, NAD HENT: [name]/AT Lungs: Diffuse inspiratory and expiratory wheezing, prolonged expiratory phase, good aeration, no respiratory distress Cardiovascular: RRR Abdomen: Soft, nontender, positive bowel sounds Extremities: WWP, no edema Neuro: Alert and interactive, responds to questions appropriately  Labs reveal creatinine back to baseline, hypokalemia resolved, mild  leukocytosis, improving lactate  Resolved Hospital Problem list   AKI Hypokalemia  Assessment & Plan:   Acute hypoxemic respiratory failure secondary to status asthmaticus Rhinovirus/enterovirus infection CXR unremarkable Improving, off BiPAP, can transfer to floor today - scheduled ICS and LABA neb - SABA neb prn - start prednisone 40 mg tomorrow x 3 additional days - d/c IV methylprednisolone, received AM dose - d/c azithromycin - consider controller med and pulm referral on discharge  Elevated lactate Improving, no acidosis. Likely from SABA - s/p LR 500 cc bolus  GERD - PPI  Anxiety - home hydroxyzine  Best Practice (right click and "Reselect all SmartList Selections" daily)   Diet/type: Regular consistency (see orders) DVT prophylaxis: prophylactic heparin  GI prophylaxis: PPI Lines: N/A Foley:  N/A Code Status:  full code Last date of multidisciplinary goals of care discussion [pending]  Labs   CBC: Recent Labs  Lab 05/09/21 1226 05/09/21 1233 05/10/21 0104  WBC 14.2*  --  12.0*  NEUTROABS 9.8*  --   --   HGB 14.6 15.6 13.6  HCT 44.6 46.0 42.0  MCV 82.7  --  83.3  PLT 280  --  PLATELET CLUMPS NOTED ON SMEAR, COUNT APPEARS DECREASED    Basic Metabolic Panel: Recent Labs  Lab 05/09/21 1226 05/09/21 1233 05/10/21 0104  NA 139 141 136  K 3.4* 3.4* 4.3  CL 104  --  104  CO2 25  --  21*  GLUCOSE 118*  --  136*  BUN 5*  --  7  CREATININE 1.36*  --  1.14  CALCIUM 9.2  --  9.1   GFR: CrCl cannot be calculated (Unknown ideal weight.). Recent Labs  Lab 05/09/21  1226 05/09/21 1820 05/10/21 0104 05/10/21 0421  WBC 14.2*  --  12.0*  --   LATICACIDVEN  --  5.3* 3.6* 2.7*    Liver Function Tests: No results for input(s): AST, ALT, ALKPHOS, BILITOT, PROT, ALBUMIN in the last 168 hours. No results for input(s): LIPASE, AMYLASE in the last 168 hours. No results for input(s): AMMONIA in the last 168 hours.  ABG    Component Value Date/Time    HCO3 25.6 05/09/2021 1233   TCO2 27 05/09/2021 1233   ACIDBASEDEF 1.0 05/09/2021 1233   O2SAT 82.0 05/09/2021 1233     Coagulation Profile: No results for input(s): INR, PROTIME in the last 168 hours.  Cardiac Enzymes: No results for input(s): CKTOTAL, CKMB, CKMBINDEX, TROPONINI in the last 168 hours.  HbA1C: Hgb A1c MFr Bld  Date/Time Value Ref Range Status  03/20/2020 12:00 PM 5.5 4.8 - 5.6 % Final    Comment:             Prediabetes: 5.7 - 6.4          Diabetes: >6.4          Glycemic control for adults with diabetes: <7.0     CBG: Recent Labs  Lab 05/09/21 1931 05/09/21 2329 05/10/21 0329 05/10/21 0744  GLUCAP 162* 147* 137* 133*   Critical care time:     Zola Button, MD [place], PGY-2

## 2021-05-11 ENCOUNTER — Telehealth: Payer: Self-pay | Admitting: Critical Care Medicine

## 2021-05-11 DIAGNOSIS — J9601 Acute respiratory failure with hypoxia: Secondary | ICD-10-CM

## 2021-05-11 DIAGNOSIS — K219 Gastro-esophageal reflux disease without esophagitis: Secondary | ICD-10-CM

## 2021-05-11 DIAGNOSIS — J4531 Mild persistent asthma with (acute) exacerbation: Secondary | ICD-10-CM

## 2021-05-11 LAB — GLUCOSE, CAPILLARY
Glucose-Capillary: 96 mg/dL (ref 70–99)
Glucose-Capillary: 92 mg/dL (ref 70–99)

## 2021-05-11 MED ORDER — ALBUTEROL SULFATE HFA 108 (90 BASE) MCG/ACT IN AERS: 2.0000 | INHALATION_SPRAY | RESPIRATORY_TRACT | Status: DC | PRN

## 2021-05-11 MED ORDER — ENOXAPARIN SODIUM 40 MG/0.4ML IJ SOSY
40.0000 mg | PREFILLED_SYRINGE | INTRAMUSCULAR | Status: DC
  Filled 2021-05-11: qty 0.4

## 2021-05-11 MED ORDER — FLUTICASONE FUROATE-VILANTEROL 100-25 MCG/ACT IN AEPB
1.0000 | INHALATION_SPRAY | Freq: Every day | RESPIRATORY_TRACT | Status: DC
  Administered 2021-05-11: 1 via RESPIRATORY_TRACT
  Filled 2021-05-11: qty 28

## 2021-05-11 MED ORDER — MONTELUKAST SODIUM 10 MG PO TABS
10.0000 mg | ORAL_TABLET | Freq: Every day | ORAL | Status: DC
  Administered 2021-05-11: 10 mg via ORAL
  Filled 2021-05-11: qty 1

## 2021-05-11 MED ORDER — MOMETASONE FURO-FORMOTEROL FUM 100-5 MCG/ACT IN AERO
2.0000 | INHALATION_SPRAY | Freq: Two times a day (BID) | RESPIRATORY_TRACT | Status: DC
  Administered 2021-05-11 – 2021-05-12 (×3): 2 via RESPIRATORY_TRACT
  Filled 2021-05-11: qty 8.8

## 2021-05-11 MED ORDER — TRIAMCINOLONE ACETONIDE 0.1 % EX CREA
1.0000 "application " | TOPICAL_CREAM | Freq: Two times a day (BID) | CUTANEOUS | Status: DC
  Administered 2021-05-11 – 2021-05-12 (×3): 1 via TOPICAL
  Filled 2021-05-11: qty 15

## 2021-05-11 MED ORDER — PREDNISONE 20 MG PO TABS
40.0000 mg | ORAL_TABLET | Freq: Every day | ORAL | Status: DC
  Administered 2021-05-11 – 2021-05-12 (×2): 40 mg via ORAL
  Filled 2021-05-11 (×2): qty 2

## 2021-05-11 NOTE — Telephone Encounter (Signed)
Please schedule for appointment in 1 month.  Julian Hy, DO 05/11/21 9:05 AM [facility]

## 2021-05-11 NOTE — Progress Notes (Signed)
NAME:  Adrian [name], MRN:  [id number], DOB:  Jun 06, 1976, LOS: 2 ADMISSION DATE:  05/09/2021, CONSULTATION DATE:  05/09/21 REFERRING MD:  Horton , CHIEF COMPLAINT:  status asthmaticus    History of Present Illness:  45 year old male with mild intermittent asthma admitted for status asthmaticus in the setting of rhinovirus/enterovirus viral infection for 1 week.  Ran out of SABA at home.  Presented to the ED 11/10 with respiratory distress, placed on BiPAP and given IV methylprednisolone and CAT.  PCCM asked to admit.  Pertinent  Medical History  Mild intermittent asthma GERD Allergic rhinitis Depression/anxiety Eczema Heart murmur Multiple food allergies  Significant Hospital Events: Including procedures, antibiotic start and stop dates in addition to other pertinent events   11/10 admitted w/ status asthmaticus. Placed on BIPAP/steroids, BDs/ICS and azith. RVP sent.   Interim History / Subjective:  Feeling ok. Has had asthma his entire life, hospitalized several times. Has a lot of 2nd hand smoke exposure at home, doesn't smoke or vape personally. Not currently taking ICS, but thinks he needs it. Uses albuterol 2-3 times per week at baseline. Has allergies- doesn't take anything for it.  Objective   Blood pressure (!) 100/54, pulse (!) 58, temperature 98.3 F (36.8 C), temperature source Oral, resp. rate 18, SpO2 94 %.       No intake or output data in the 24 hours ending 05/11/21 0728  There were no vitals filed for this visit.  Examination: General: middle aged man sitting up in bed no acute distress HENT: [place]/AT, eyes anicteric Lungs: Faint wheezing left base, otherwise clear.  No conversational dyspnea or accessory muscle use.  Saturating well on 2 L nasal cannula. Cardiovascular: S1-S2, regular rate and rhythm Abdomen: Soft, nontender, nondistended Extremities: No lower extremity edema, no clubbing or cyanosis Neuro: Awake and alert, answering questions  appropriately Derm: Warm, dry, no rashes   Resolved Hospital Problem list   AKI Hypokalemia  Assessment & Plan:   Acute hypoxemic respiratory failure secondary to status asthmaticus Rhinovirus infection precipitating acute asthma exacerbation Allergic asthma -Switching to ICS/LABA MDI, needs to go home on this (Wixela, Advair, Symbicort, Breo, Dulera are all fine-whichever is most affordable).  RTs will review inhaler technique today. - Start Singulair - Can add Zyrtec or Claritin daily if allergies remain uncontrolled - Discussed the importance of avoiding all secondhand smoke exposure.  Everyone in the house should only be smoking outside.  Ideally they should quit altogether.  Encouraged his ongoing complete avoidance of smoking. -Albuterol as needed -Wean supplemental oxygen as able to maintain SPO2 greater than 90% - Prednisone 40 mg daily-taper over 1 week. - Needs community health and wellness referral for medication assistance - Appointment for pulmonary follow-up has been requested.  Elevated lactate likely from albuterol -No additional monitoring  GERD - PPI--May need at home to optimize asthma control  Anxiety -Per primary  PCCM will sign off.  Please call with questions.  Best Practice (right click and "Reselect all SmartList Selections" daily)  Per primary  Labs   CBC: Recent Labs  Lab 05/09/21 1226 05/09/21 1233 05/10/21 0104  WBC 14.2*  --  12.0*  NEUTROABS 9.8*  --   --   HGB 14.6 15.6 13.6  HCT 44.6 46.0 42.0  MCV 82.7  --  83.3  PLT 280  --  PLATELET CLUMPS NOTED ON SMEAR, COUNT APPEARS DECREASED     Basic Metabolic Panel: Recent Labs  Lab 05/09/21 1226 05/09/21 1233 05/10/21 0104  NA 139 141  136  K 3.4* 3.4* 4.3  CL 104  --  104  CO2 25  --  21*  GLUCOSE 118*  --  136*  BUN 5*  --  7  CREATININE 1.36*  --  1.14  CALCIUM 9.2  --  9.1    Julian Hy, DO 05/11/21 9:08 AM [facility]

## 2021-05-11 NOTE — Assessment & Plan Note (Addendum)
--   Overall clinically improved, lung sounds fairly decent, continue steroid taper with transition to prednisone tomorrow, discharged with albuterol inhaler, outpatient referral to pulmonology -- Non-smoker, but girlfriend smokes cigarettes and marijuana

## 2021-05-11 NOTE — Assessment & Plan Note (Signed)
--   With severe respiratory distress on admission requiring BiPAP and admission to the ICU with concern for intubation -- Much improved today, down to 2 L nasal cannula, still short of breath and not back to baseline, particularly dyspneic when walking. -- Treat asthma as below, wean oxygen

## 2021-05-11 NOTE — Hospital Course (Addendum)
45 year old man PMH asthma admitted for severe asthma exacerbation and respiratory failure requiring BiPAP and near intubation --11/11 admitted by critical care for above --11/12 stable off BiPAP since 1700 11/11

## 2021-05-11 NOTE — Progress Notes (Signed)
  Progress Note Adrian [name]  UOR:[id number] DOB: 1976-05-20  DOA: 05/09/2021     2 Date of Service: 05/11/2021   Clinical Course 45 year old man PMH asthma admitted for severe asthma exacerbation and respiratory failure requiring BiPAP and near intubation --11/11 admitted by critical care for above --11/12 stable off BiPAP since 1700 11/11  Assessment and Plan * Acute respiratory failure with hypoxia (HCC) -- With severe respiratory distress on admission requiring BiPAP and admission to the ICU with concern for intubation -- Much improved today, down to 2 L nasal cannula, still short of breath and not back to baseline, particularly dyspneic when walking. -- Treat asthma as below, wean oxygen  AB (asthmatic bronchitis), mild intermittent, with acute exacerbation -- Overall clinically improved, lung sounds fairly decent, continue steroid taper with transition to prednisone tomorrow, discharged with albuterol inhaler, outpatient referral to pulmonology -- Non-smoker, but girlfriend smokes cigarettes and marijuana  Gastroesophageal reflux disease without esophagitis -- Stable, continue Protonix  Subjective:  Feels better, but still not back to baseline, quite short of breath with ambulation, not on oxygen at home.  Has been admitted previously for asthma but never intubated.  Does not smoke, but girlfriend smokes cigarettes and marijuana.  Objective Vitals:   05/11/21 0700 05/11/21 0755 05/11/21 0800 05/11/21 0829  BP:  100/82 116/68 116/68  Pulse: (!) 57 68 64 65  Resp:  14 15 16   Temp:  98.8 F (37.1 C)    TempSrc:  Oral    SpO2:  97%       Vitals reviewed Exam Physical Exam Vitals and nursing note reviewed.  Constitutional:      General: He is not in acute distress.    Appearance: He is not ill-appearing or toxic-appearing.  Cardiovascular:     Rate and Rhythm: Normal rate and regular rhythm.     Heart sounds: No murmur heard. Pulmonary:     Effort: No tachypnea or  respiratory distress.     Comments: Fair air movement, some inspiratory wheezes Musculoskeletal:     Right lower leg: No edema.     Left lower leg: No edema.  Psychiatric:        Mood and Affect: Mood normal.        Behavior: Behavior normal.     Labs / Other Information My review of labs, imaging, notes and other tests is significant for CBG stable, COVID and influenza were negative,     Disposition Plan: Status is: Inpatient  Remains inpatient appropriate because: Remains hypoxic, requiring 2 L nasal cannula, respiratory status not back to baseline with dyspnea on exertion.  Plan to wean oxygen today, possibly home tomorrow if improving.   Prophylaxis Heparin No family present  Time spent: 20 minutes [facility] 05/11/2021, 9:04 AM

## 2021-05-11 NOTE — Assessment & Plan Note (Signed)
Stable, continue Protonix 

## 2021-05-11 NOTE — Progress Notes (Signed)
  Patient ambulated from his room to the nursing station, without any complaints of SOB or discomfort, oxygen saturation 92% on room air.

## 2021-05-12 MED ORDER — MONTELUKAST SODIUM 10 MG PO TABS
10.0000 mg | ORAL_TABLET | Freq: Every day | ORAL | 2 refills | Status: DC
  Filled 2021-05-12: qty 30, 30d supply, fill #0

## 2021-05-12 MED ORDER — DULERA 100-5 MCG/ACT IN AERO
2.0000 | INHALATION_SPRAY | Freq: Two times a day (BID) | RESPIRATORY_TRACT | 2 refills | Status: DC
  Filled 2021-05-12 – 2021-08-06 (×2): qty 13, 30d supply, fill #0

## 2021-05-12 MED ORDER — ALBUTEROL SULFATE HFA 108 (90 BASE) MCG/ACT IN AERS: 1.0000 | INHALATION_SPRAY | RESPIRATORY_TRACT | 2 refills | Status: DC | PRN

## 2021-05-12 MED ORDER — ALBUTEROL SULFATE HFA 108 (90 BASE) MCG/ACT IN AERS
2.0000 | INHALATION_SPRAY | RESPIRATORY_TRACT | 2 refills | Status: DC | PRN
  Filled 2021-05-12 – 2021-07-23 (×2): qty 18, 16d supply, fill #0
  Filled 2021-08-06: qty 18, 16d supply, fill #1

## 2021-05-12 MED ORDER — PREDNISONE 10 MG PO TABS
ORAL_TABLET | ORAL | 0 refills | Status: AC
  Filled 2021-05-12: qty 21, 9d supply, fill #0

## 2021-05-12 MED ORDER — ALBUTEROL SULFATE HFA 108 (90 BASE) MCG/ACT IN AERS
1.0000 | INHALATION_SPRAY | RESPIRATORY_TRACT | 2 refills | Status: DC | PRN
  Filled 2021-05-12: qty 18, 16d supply, fill #0

## 2021-05-12 MED ORDER — ALBUTEROL SULFATE HFA 108 (90 BASE) MCG/ACT IN AERS: 2.0000 | INHALATION_SPRAY | RESPIRATORY_TRACT | Status: DC | PRN

## 2021-05-12 NOTE — Assessment & Plan Note (Signed)
--   Secondary to asthma exacerbation.  With severe respiratory distress on admission requiring BiPAP and admission to the ICU with concern for intubation -- Now resolved, stable on room air.

## 2021-05-12 NOTE — Assessment & Plan Note (Signed)
Stable, continue Protonix 

## 2021-05-12 NOTE — Care Management (Signed)
Patient follows at [facility]. MD sent needed script to [facility] and patient will fill with [facility] letter, and get refills etc at [facility] Monday.

## 2021-05-12 NOTE — Progress Notes (Signed)
[name] to be D/C'd Home per MD order.  Discussed with the patient and all questions fully answered.  VSS, Skin clean, dry and intact without evidence of skin break down, no evidence of skin tears noted. IV catheter discontinued intact. Site without signs and symptoms of complications. Dressing and pressure applied.  An After Visit Summary was printed and given to the patient. Patient received prescription.  D/c education completed with patient/family including follow up instructions, medication list, d/c activities limitations if indicated, with other d/c instructions as indicated by MD - patient able to verbalize understanding, all questions fully answered.   Patient instructed to return to ED, call 911, or call MD for any changes in condition.   Patient escorted via [place], and D/C home via private auto.   Lorenza Evangelist [facility] 05/12/2021 10:57 AM

## 2021-05-12 NOTE — Discharge Summary (Addendum)
Physician Discharge Summary   Patient name: Adrian [name]  Admit date:     05/09/2021  Discharge date: 05/12/2021  Discharge Physician: Murray Hodgkins   PCP: Gildardo Pounds, NP   Recommendations at discharge:  Severe asthma exacerbation, needs outpatient follow-up with pulmonology for establishing care (referral made).  Discharge Diagnoses Principal Problem:   Acute respiratory failure with hypoxia (HCC) Active Problems:   AB (asthmatic bronchitis), mild intermittent, with acute exacerbation secondary to rhinovirus/enterovirus   Gastroesophageal reflux disease without esophagitis  Hospital Course   45 year old man PMH asthma admitted for severe asthma exacerbation and respiratory failure requiring BiPAP and near intubation --11/11 admitted by critical care for above --11/12 stable off BiPAP since 1700 11/11 --11/13 weaned off oxygen, discharged home in good condition.  * Acute respiratory failure with hypoxia (HCC) -- Secondary to asthma exacerbation.  With severe respiratory distress on admission requiring BiPAP and admission to the ICU with concern for intubation -- Now resolved, stable on room air.  AB (asthmatic bronchitis), mild intermittent, with acute exacerbation -- Appears resolved at this point.  Continue with steroid taper on discharge, prescription given for albuterol inhaler, sent with Dulera inhaler, Singulair, prednisone and Advair prescription sent to [facility] health and wellness pharmacy.  [facility]  Match letter given -- Recommended no smoking in house (girlfriend smokes cigarettes and marijuana)  Gastroesophageal reflux disease without esophagitis -- Stable, continue Protonix   Procedures performed: none   Condition at discharge: good  Feels good, ready to go home Exam Physical Exam Vitals reviewed.  Constitutional:      General: He is not in acute distress. Cardiovascular:     Rate and Rhythm: Normal rate and regular rhythm.      Heart sounds: No murmur heard. Pulmonary:     Effort: Pulmonary effort is normal. No respiratory distress.     Breath sounds: No wheezing, rhonchi or rales.  Psychiatric:        Mood and Affect: Mood normal.        Behavior: Behavior normal.    Disposition: Home  Discharge time: greater than 30 minutes.  Follow-up Information     [facility] Follow up.   Specialty: Pulmonology Why: office will call you with an appointment Contact information: [place] [phone]                Allergies as of 05/12/2021       Reactions   Apple Shortness Of Breath, Itching, Nausea Only, Swelling   Tongue itches also   Atrovent [ipratropium] Anaphylaxis   Atrovent contains peanut oil and PT has a HX of anaphlaxis to peanuts   Banana Shortness Of Breath, Itching   Makes tongue itch   Carrot Oil Shortness Of Breath, Itching   Fish Allergy Anaphylaxis, Hives, Swelling   Fruit & Vegetable Daily [nutritional Supplements] Shortness Of Breath, Itching   Patient stated that he allergic to most all fruits   Peanut-containing Drug Products Anaphylaxis, Hives, Nausea And Vomiting, Swelling   Shellfish Allergy Anaphylaxis, Hives, Swelling, Other (See Comments)   "ALL SEAFOOD"   Tomato Anaphylaxis, Hives, Swelling   Chicken Meat (diagnostic) Other (See Comments)   Tested allergic to this years ago   Latex Itching   No powdered gloves!!   Pork-derived Products Other (See Comments)   CAUSES SEVERE HEADACHES   Soy Allergy Other (See Comments)   Tested allergic to this years ago   Grass Extracts Robinson  Pollens] Hives, Rash, Other (See Comments)   Wheezing also   Tree Extract Other (See Comments)   Patient tested allergic to this years ago        Medication List     STOP taking these medications    Flovent HFA 44 MCG/ACT inhaler Generic drug: fluticasone       TAKE these medications    albuterol 108 (90 Base)  MCG/ACT inhaler Commonly known as: VENTOLIN HFA Inhale 2 puffs into the lungs every 4 (four) hours as needed for wheezing or shortness of breath. What changed: You were already taking a medication with the same name, and this prescription was added. Make sure you understand how and when to take each.   albuterol 108 (90 Base) MCG/ACT inhaler Commonly known as: Ventolin HFA Inhale 1-2 puffs into the lungs every 4 (four) hours as needed for wheezing or shortness of breath. What changed:  how much to take how to take this when to take this reasons to take this   hydrOXYzine 25 MG tablet Commonly known as: ATARAX/VISTARIL TAKE 1/2 TO 1 TABLET BY MOUTH EVERY 8 HOURS AS NEEDED FOR ANXIETY. What changed:  how much to take how to take this when to take this reasons to take this   meclizine 25 MG tablet Commonly known as: ANTIVERT Take 1 tablet (25 mg total) by mouth 3 (three) times daily.   mometasone-formoterol 100-5 MCG/ACT Aero Commonly known as: DULERA Inhale 2 puffs into the lungs 2 (two) times daily.   montelukast 10 MG tablet Commonly known as: SINGULAIR Take 1 tablet (10 mg total) by mouth at bedtime.   ondansetron 8 MG disintegrating tablet Commonly known as: ZOFRAN-ODT TAKE 1 TABLET (8 MG TOTAL) BY MOUTH EVERY 8 (EIGHT) HOURS AS NEEDED FOR NAUSEA OR VOMITING.   pantoprazole 40 MG tablet Commonly known as: PROTONIX TAKE 1 TABLET (40 MG TOTAL) BY MOUTH DAILY.   predniSONE 10 MG tablet Commonly known as: DELTASONE Take 4 tablets (40 mg total) by mouth daily with breakfast for 3 days, THEN 2 tablets (20 mg total) daily with breakfast for 3 days, THEN 1 tablet (10 mg total) daily with breakfast for 3 days. Start taking on: May 13, 2021   traZODone 50 MG tablet Commonly known as: DESYREL Take 0.5-1 tablets (25-50 mg total) by mouth at bedtime as needed for sleep.   triamcinolone cream 0.1 % Commonly known as: KENALOG Apply 1 application topically 2 (two) times  daily.   Vitamin D (Ergocalciferol) 1.25 MG (50000 UNIT) Caps capsule Commonly known as: DRISDOL TAKE 1 CAPSULE (50,000 UNITS TOTAL) BY MOUTH EVERY 7 (SEVEN) DAYS.        DG Chest Port 1 View  Result Date: 05/09/2021 CLINICAL DATA:  Shortness of breath, wheezing EXAM: PORTABLE CHEST 1 VIEW COMPARISON:  09/03/2019 FINDINGS: The heart size and mediastinal contours are within normal limits. Both lungs are clear. The visualized skeletal structures are unremarkable. IMPRESSION: No active disease. Electronically Signed   By: Elmer Picker M.D.   On: 05/09/2021 10:10   Results for orders placed or performed during the hospital encounter of 05/09/21  Resp Panel by RT-PCR (Flu A&B, Covid) Nasopharyngeal Swab     Status: None   Collection Time: 05/09/21 12:26 PM   Specimen: Nasopharyngeal Swab; Nasopharyngeal(NP) swabs in vial transport medium  Result Value Ref Range Status   SARS Coronavirus 2 by RT PCR NEGATIVE NEGATIVE Final    Comment: (NOTE) SARS-CoV-2 target nucleic acids are NOT DETECTED.  The SARS-CoV-2  RNA is generally detectable in upper respiratory specimens during the acute phase of infection. The lowest concentration of SARS-CoV-2 viral copies this assay can detect is 138 copies/mL. A negative result does not preclude SARS-Cov-2 infection and should not be used as the sole basis for treatment or other patient management decisions. A negative result may occur with  improper specimen collection/handling, submission of specimen other than nasopharyngeal swab, presence of viral mutation(s) within the areas targeted by this assay, and inadequate number of viral copies(<138 copies/mL). A negative result must be combined with clinical observations, patient history, and epidemiological information. The expected result is Negative.  Fact Sheet for Patients:  EntrepreneurPulse.com.au  Fact Sheet for Healthcare Providers:   IncredibleEmployment.be  This test is no t yet approved or cleared by the [place] FDA and  has been authorized for detection and/or diagnosis of SARS-CoV-2 by FDA under an Emergency Use Authorization (EUA). This EUA will remain  in effect (meaning this test can be used) for the duration of the COVID-19 declaration under Section 564(b)(1) of the Act, 21 U.S.C.section 360bbb-3(b)(1), unless the authorization is terminated  or revoked sooner.       Influenza A by PCR NEGATIVE NEGATIVE Final   Influenza B by PCR NEGATIVE NEGATIVE Final    Comment: (NOTE) The Xpert Xpress SARS-CoV-2/FLU/RSV plus assay is intended as an aid in the diagnosis of influenza from Nasopharyngeal swab specimens and should not be used as a sole basis for treatment. Nasal washings and aspirates are unacceptable for Xpert Xpress SARS-CoV-2/FLU/RSV testing.  Fact Sheet for Patients: EntrepreneurPulse.com.au  Fact Sheet for Healthcare Providers: IncredibleEmployment.be  This test is not yet approved or cleared by the [place] FDA and has been authorized for detection and/or diagnosis of SARS-CoV-2 by FDA under an Emergency Use Authorization (EUA). This EUA will remain in effect (meaning this test can be used) for the duration of the COVID-19 declaration under Section 564(b)(1) of the Act, 21 U.S.C. section 360bbb-3(b)(1), unless the authorization is terminated or revoked.  Performed at [facility], [place] [address] [place], [place] [address]   Respiratory (~20 pathogens) panel by PCR     Status: Abnormal   Collection Time: 05/09/21  6:47 PM   Specimen: Nasopharyngeal Swab; Respiratory  Result Value Ref Range Status   Adenovirus NOT DETECTED NOT DETECTED Final   Coronavirus 229E NOT DETECTED NOT DETECTED Final    Comment: (NOTE) The Coronavirus on the Respiratory Panel, DOES NOT test for the novel  Coronavirus (2019 nCoV)     Coronavirus HKU1 NOT DETECTED NOT DETECTED Final   Coronavirus NL63 NOT DETECTED NOT DETECTED Final   Coronavirus OC43 NOT DETECTED NOT DETECTED Final   Metapneumovirus NOT DETECTED NOT DETECTED Final   Rhinovirus / Enterovirus DETECTED (A) NOT DETECTED Final   Influenza A NOT DETECTED NOT DETECTED Final   Influenza B NOT DETECTED NOT DETECTED Final   Parainfluenza Virus 1 NOT DETECTED NOT DETECTED Final   Parainfluenza Virus 2 NOT DETECTED NOT DETECTED Final   Parainfluenza Virus 3 NOT DETECTED NOT DETECTED Final   Parainfluenza Virus 4 NOT DETECTED NOT DETECTED Final   Respiratory Syncytial Virus NOT DETECTED NOT DETECTED Final   Bordetella pertussis NOT DETECTED NOT DETECTED Final   Bordetella Parapertussis NOT DETECTED NOT DETECTED Final   Chlamydophila pneumoniae NOT DETECTED NOT DETECTED Final   Mycoplasma pneumoniae NOT DETECTED NOT DETECTED Final    Comment: Performed at [facility], [place]. [address], [place],  [address]  MRSA  Next Gen by PCR, Nasal     Status: None   Collection Time: 05/10/21  4:40 AM   Specimen: Nasal Mucosa; Nasal Swab  Result Value Ref Range Status   MRSA by PCR Next Gen NOT DETECTED NOT DETECTED Final    Comment: (NOTE) The GeneXpert MRSA Assay (FDA approved for NASAL specimens only), is one component of a comprehensive MRSA colonization surveillance program. It is not intended to diagnose MRSA infection nor to guide or monitor treatment for MRSA infections. Test performance is not FDA approved in patients less than 24 years old. Performed at [facility], [place] [address] [place], [place] [address]     Signed:  Murray Hodgkins MD.  [facility] 05/12/2021, 11:30 AM

## 2021-05-12 NOTE — Assessment & Plan Note (Signed)
--   Appears resolved at this point.  Continue with steroid taper on discharge, prescription given for albuterol inhaler, sent with Dulera inhaler, Singulair, prednisone and Advair prescription sent to [facility] health and wellness pharmacy.  [facility]  Match letter given -- Recommended no smoking in house (girlfriend smokes cigarettes and marijuana)

## 2021-05-13 ENCOUNTER — Other Ambulatory Visit: Payer: Self-pay

## 2021-05-13 ENCOUNTER — Telehealth: Payer: Self-pay

## 2021-05-13 NOTE — Telephone Encounter (Signed)
Needs OV with a provider. I left office number to call and establish with a new provider in office.
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
## 2021-05-14 ENCOUNTER — Other Ambulatory Visit: Payer: Self-pay

## 2021-05-22 ENCOUNTER — Encounter: Payer: Self-pay | Admitting: Nurse Practitioner

## 2021-05-22 ENCOUNTER — Other Ambulatory Visit: Payer: Self-pay

## 2021-05-22 ENCOUNTER — Ambulatory Visit: Payer: Self-pay | Attending: Nurse Practitioner | Admitting: Nurse Practitioner

## 2021-05-22 VITALS — BP 120/81 | HR 60 | Ht 71.0 in | Wt 252.0 lb

## 2021-05-22 DIAGNOSIS — Z09 Encounter for follow-up examination after completed treatment for conditions other than malignant neoplasm: Secondary | ICD-10-CM

## 2021-05-22 DIAGNOSIS — F419 Anxiety disorder, unspecified: Secondary | ICD-10-CM

## 2021-05-22 DIAGNOSIS — F32A Depression, unspecified: Secondary | ICD-10-CM

## 2021-05-22 MED ORDER — CITALOPRAM HYDROBROMIDE 10 MG PO TABS
10.0000 mg | ORAL_TABLET | Freq: Every day | ORAL | 3 refills | Status: DC
Start: 2021-05-22 — End: 2021-12-06
  Filled 2021-05-22: qty 30, 30d supply, fill #0

## 2021-05-22 NOTE — Progress Notes (Signed)
Assessment & Plan:  Adrian [name] was seen today for hospitalization follow-up.  Diagnoses and all orders for this visit:  Hospital discharge follow-up Follow-up with [facility] pulmonology as instructed as soon as possible Continue all asthma medications as prescribed.  Avoid smoking or being around other smokers.  Anxiety and depression -     citalopram (CELEXA) 10 MG tablet; Take 1 tablet (10 mg total) by mouth daily.   Patient has been counseled on age-appropriate routine health concerns for screening and prevention. These are reviewed and up-to-date. Referrals have been placed accordingly. Immunizations are up-to-date or declined.    Subjective:   Chief Complaint  Patient presents with   Hospitalization Follow-up   HPI Adrian [name] 45 y.o. male presents to office today for [place]. He has a past medical history of Asthma, Eczema, Environmental allergies, Foot fracture, left, Heart murmur, Migraine, and Multiple food allergies.     HFU Admitted to the hospital for 13 days with severe asthma exacerbation and respiratory failure requiring BiPAP and near intubation.  He is supposed be following up with [facility] pulmonology however they had tried to contact him and were not able to schedule.  He states he has their number and will be calling them to schedule an appointment. Today he endorses medication adherence with Dulera inhaler, Singulair p.o., and albuterol. The exam room today does smell of marijuana.  He has no concerns today aside from residual back pain from prolonged hospitalization.   Anxiety and depression PHQ-9 is elevated.  We will start Celexa today.  He will continue on.  Hydroxyzine for anxiety. Depression screen PHQ 2/9 05/22/2021  Decreased Interest 3  Down, Depressed, Hopeless 3  PHQ - 2 Score 6  Altered sleeping 3  Tired, decreased energy 3  Change in appetite 2  Feeling bad or failure about yourself  2  Trouble concentrating 2  Moving slowly or fidgety/restless 1   Suicidal thoughts 0  PHQ-9 Score 19  Difficult doing work/chores Not difficult at all  Some recent data might be hidden    GAD 7 : Generalized Anxiety Score 05/22/2021 02/26/2021 10/04/2020 08/02/2020  Nervous, Anxious, on Edge 3 2 2  0  Control/stop worrying 3 2 1 3   Worry too much - different things - 2 1 3   Trouble relaxing 3 2 1 3   Restless 0 2 2 0  Easily annoyed or irritable 2 1 2 3   Afraid - awful might happen 2 0 1 3  Total GAD 7 Score - 11 10 15   Anxiety Difficulty Somewhat difficult - - -      Review of Systems  Constitutional:  Negative for fever, malaise/fatigue and weight loss.  HENT: Negative.  Negative for nosebleeds.   Eyes: Negative.  Negative for blurred vision, double vision and photophobia.  Respiratory:  Negative for cough, shortness of breath and wheezing.   Cardiovascular: Negative.  Negative for chest pain, palpitations and leg swelling.  Gastrointestinal: Negative.  Negative for heartburn, nausea and vomiting.  Musculoskeletal:  Positive for back pain and myalgias.  Neurological: Negative.  Negative for dizziness, focal weakness, seizures and headaches.  Psychiatric/Behavioral:  Positive for depression. Negative for suicidal ideas. The patient is nervous/anxious.    Past Medical History:  Diagnosis Date   Asthma    at birth   Eczema    Environmental allergies    grass   Foot fracture, left    Heart murmur    Migraine    Multiple food allergies    fish, tomatoes, peanuts,  eggs, chicken and others    Past Surgical History:  Procedure Laterality Date   INGUINAL HERNIA REPAIR  1994    Family History  Problem Relation Age of Onset   Asthma Father    Diabetes Mother    Hypertension Mother    Clotting disorder Other        Aunt   Breast cancer Other        Grandmother   Prostate cancer Other        uncle   Hyperlipidemia Other        grandparent   Hypertension Other        grandparent/grandparent   Kidney disease Other         aunt/uncle/grandparent   Stroke Other        grandparent    Social History Reviewed with no changes to be made today.   Outpatient Medications Prior to Visit  Medication Sig Dispense Refill   albuterol (VENTOLIN HFA) 108 (90 Base) MCG/ACT inhaler Inhale 2 puffs into the lungs every 4 (four) hours as needed for wheezing or shortness of breath. 18 g 2   albuterol (VENTOLIN HFA) 108 (90 Base) MCG/ACT inhaler Inhale 1-2 puffs into the lungs every 4 (four) hours as needed for wheezing or shortness of breath. 18 g 2   hydrOXYzine (ATARAX/VISTARIL) 25 MG tablet TAKE 1/2 TO 1 TABLET BY MOUTH EVERY 8 HOURS AS NEEDED FOR ANXIETY. (Patient taking differently: Take 25 mg by mouth every 8 (eight) hours as needed for anxiety.) 60 tablet 1   meclizine (ANTIVERT) 25 MG tablet Take 1 tablet (25 mg total) by mouth 3 (three) times daily. 30 tablet 2   mometasone-formoterol (DULERA) 100-5 MCG/ACT AERO Inhale 2 puffs into the lungs 2 (two) times daily. 13 g 2   montelukast (SINGULAIR) 10 MG tablet Take 1 tablet (10 mg total) by mouth at bedtime. 30 tablet 2   ondansetron (ZOFRAN-ODT) 8 MG disintegrating tablet TAKE 1 TABLET (8 MG TOTAL) BY MOUTH EVERY 8 (EIGHT) HOURS AS NEEDED FOR NAUSEA OR VOMITING. 30 tablet 1   pantoprazole (PROTONIX) 40 MG tablet TAKE 1 TABLET (40 MG TOTAL) BY MOUTH DAILY. 30 tablet 3   predniSONE (DELTASONE) 10 MG tablet Take 4 tablets (40 mg total) by mouth daily with breakfast for 3 days, THEN 2 tablets (20 mg total) daily with breakfast for 3 days, THEN 1 tablet (10 mg total) daily with breakfast for 3 days. 21 tablet 0   traZODone (DESYREL) 50 MG tablet Take 0.5-1 tablets (25-50 mg total) by mouth at bedtime as needed for sleep. 30 tablet 3   triamcinolone cream (KENALOG) 0.1 % Apply 1 application topically 2 (two) times daily. 30 g 0   Vitamin D, Ergocalciferol, (DRISDOL) 1.25 MG (50000 UNIT) CAPS capsule TAKE 1 CAPSULE (50,000 UNITS TOTAL) BY MOUTH EVERY 7 (SEVEN) DAYS. 4 capsule 2   No  facility-administered medications prior to visit.    Allergies  Allergen Reactions   Apple Shortness Of Breath, Itching, Nausea Only and Swelling    Tongue itches also   Atrovent [Ipratropium] Anaphylaxis    Atrovent contains peanut oil and PT has a HX of anaphlaxis to peanuts   Banana Shortness Of Breath and Itching    Makes tongue itch   Carrot Oil Shortness Of Breath and Itching   Fish Allergy Anaphylaxis, Hives and Swelling   Fruit & Vegetable Daily [Nutritional Supplements] Shortness Of Breath and Itching    Patient stated that he allergic to most all fruits  Peanut-Containing Drug Products Anaphylaxis, Hives, Nausea And Vomiting and Swelling   Shellfish Allergy Anaphylaxis, Hives, Swelling and Other (See Comments)    "ALL SEAFOOD"   Tomato Anaphylaxis, Hives and Swelling   Chicken Meat (Diagnostic) Other (See Comments)    Tested allergic to this years ago   Latex Itching    No powdered gloves!!   Pork-Derived Products Other (See Comments)    CAUSES SEVERE HEADACHES   Soy Allergy Other (See Comments)    Tested allergic to this years ago   Grass Extracts [Gramineae Pollens] Hives, Rash and Other (See Comments)    Wheezing also   Tree Extract Other (See Comments)    Patient tested allergic to this years ago       Objective:    BP 120/81   Pulse 60   Ht 5\' 11"  (1.803 m)   Wt 252 lb (114.3 kg)   SpO2 99%   BMI 35.15 kg/m  Wt Readings from Last 3 Encounters:  05/22/21 252 lb (114.3 kg)  02/26/21 253 lb (114.8 kg)  10/04/20 253 lb (114.8 kg)    Physical Exam Vitals and nursing note reviewed.  Constitutional:      Appearance: He is well-developed.  HENT:     Head: Normocephalic and atraumatic.  Cardiovascular:     Rate and Rhythm: Normal rate and regular rhythm.     Heart sounds: Normal heart sounds. No murmur heard.   No friction rub. No gallop.  Pulmonary:     Effort: Pulmonary effort is normal. No tachypnea or respiratory distress.     Breath sounds:  Normal breath sounds. No decreased breath sounds, wheezing, rhonchi or rales.  Chest:     Chest wall: No tenderness.  Abdominal:     General: Bowel sounds are normal.     Palpations: Abdomen is soft.  Musculoskeletal:        General: Normal range of motion.     Cervical back: Normal range of motion.  Skin:    General: Skin is warm and dry.  Neurological:     Mental Status: He is alert and oriented to person, place, and time.     Coordination: Coordination normal.  Psychiatric:        Behavior: Behavior normal. Behavior is cooperative.        Thought Content: Thought content normal.        Judgment: Judgment normal.         Patient has been counseled extensively about nutrition and exercise as well as the importance of adherence with medications and regular follow-up. The patient was given clear instructions to go to ER or return to medical center if symptoms don't improve, worsen or new problems develop. The patient verbalized understanding.   Follow-up: Return for Tele on tuesday in 3 weeks depression. See me in 3 months. Gildardo Pounds, FNP-BC [facility] and [facility], [place]   05/22/2021, 12:52 PM

## 2021-05-29 ENCOUNTER — Other Ambulatory Visit: Payer: Self-pay

## 2021-07-23 ENCOUNTER — Other Ambulatory Visit: Payer: Self-pay

## 2021-07-23 ENCOUNTER — Other Ambulatory Visit: Payer: Self-pay | Admitting: Nurse Practitioner

## 2021-07-23 DIAGNOSIS — R110 Nausea: Secondary | ICD-10-CM

## 2021-07-23 NOTE — Telephone Encounter (Signed)
Requested medication (s) are due for refill today: yes  Requested medication (s) are on the active medication list: yes  Last refill:  02/26/21 #30 1 RF  Future visit scheduled: no  Notes to clinic:  pt was prescribed med at [facility] No protocol for this med   Requested Prescriptions  Pending Prescriptions Disp Refills   ondansetron (ZOFRAN-ODT) 8 MG disintegrating tablet 30 tablet 1    Sig: TAKE 1 TABLET (8 MG TOTAL) BY MOUTH EVERY 8 (EIGHT) HOURS AS NEEDED FOR NAUSEA OR VOMITING.     There is no refill protocol information for this order

## 2021-07-25 ENCOUNTER — Other Ambulatory Visit: Payer: Self-pay

## 2021-07-25 MED ORDER — ONDANSETRON 8 MG PO TBDP
ORAL_TABLET | Freq: Three times a day (TID) | ORAL | 0 refills | Status: DC | PRN
  Filled 2021-07-25: qty 30, 10d supply, fill #0

## 2021-07-26 ENCOUNTER — Other Ambulatory Visit (HOSPITAL_COMMUNITY): Payer: Self-pay

## 2021-07-26 ENCOUNTER — Other Ambulatory Visit: Payer: Self-pay

## 2021-08-06 ENCOUNTER — Other Ambulatory Visit: Payer: Self-pay

## 2021-08-20 ENCOUNTER — Other Ambulatory Visit: Payer: Self-pay | Admitting: Nurse Practitioner

## 2021-08-20 ENCOUNTER — Other Ambulatory Visit: Payer: Self-pay

## 2021-08-20 DIAGNOSIS — K219 Gastro-esophageal reflux disease without esophagitis: Secondary | ICD-10-CM

## 2021-08-20 DIAGNOSIS — R110 Nausea: Secondary | ICD-10-CM

## 2021-08-20 DIAGNOSIS — R1084 Generalized abdominal pain: Secondary | ICD-10-CM

## 2021-08-20 NOTE — Telephone Encounter (Signed)
Requested medication (s) are due for refill today -yes  Requested medication (s) are on the active medication list -yes  Future visit scheduled -no  Last refill: Zofran 07/25/21 #30                 Pantoprazole 02/26/21 #30 3RF  Notes to clinic: Request RF: non delegated Rx- with notes must have appointment, outside provider  Requested Prescriptions  Pending Prescriptions Disp Refills   pantoprazole (PROTONIX) 40 MG tablet 30 tablet 3    Sig: TAKE 1 TABLET (40 MG TOTAL) BY MOUTH DAILY.     Gastroenterology: Proton Pump Inhibitors Passed - 08/20/2021  4:03 PM      Passed - Valid encounter within last 12 months    Recent Outpatient Visits           3 months ago Hospital discharge follow-up   [place] Adrian Pounds, NP   1 year ago Benign paroxysmal positional vertigo, unspecified laterality   [place], Vernia Buff, NP   1 year ago [facility] [place], [place], [place]   1 year ago Benign paroxysmal positional vertigo, unspecified laterality   [place], [place] W, NP   2 years ago Benign paroxysmal positional vertigo, unspecified laterality   [place], [place] W, NP               ondansetron (ZOFRAN-ODT) 8 MG disintegrating tablet 30 tablet 0    Sig: TAKE 1 TABLET (8 MG TOTAL) BY MOUTH EVERY 8 (EIGHT) HOURS AS NEEDED FOR NAUSEA OR VOMITING.     Not Delegated - Gastroenterology: Antiemetics - ondansetron Failed - 08/20/2021  4:03 PM      Failed - This refill cannot be delegated      Failed - ALT in normal range and within 360 days    ALT  Date Value Ref Range Status  03/20/2020 16 0 - 44 IU/L Final          Passed - AST in normal range and within 360 days    AST  Date Value Ref Range Status  02/26/2021 19 0 - 40 IU/L Final          Passed - Valid encounter within last 6 months     Recent Outpatient Visits           3 months ago Hospital discharge follow-up   [place], Vernia Buff, NP   1 year ago Benign paroxysmal positional vertigo, unspecified laterality   [place], Vernia Buff, NP   1 year ago [place], [place], [place]   1 year ago Benign paroxysmal positional vertigo, unspecified laterality   [place], [place] W, NP   2 years ago Benign paroxysmal positional vertigo, unspecified laterality   [place], Vernia Buff, NP                 Requested Prescriptions  Pending Prescriptions Disp Refills   pantoprazole (PROTONIX) 40 MG tablet 30 tablet 3    Sig: TAKE 1 TABLET (40 MG TOTAL) BY MOUTH DAILY.     Gastroenterology: Proton Pump Inhibitors Passed - 08/20/2021  4:03 PM      Passed - Valid encounter within last  12 months    Recent Outpatient Visits           3 months ago Hospital discharge follow-up   [place] Adrian Pounds, NP   1 year ago Benign paroxysmal positional vertigo, unspecified laterality   [place], Vernia Buff, NP   1 year ago [place], [place], [place]   1 year ago Benign paroxysmal positional vertigo, unspecified laterality   [place], [place] W, NP   2 years ago Benign paroxysmal positional vertigo, unspecified laterality   [place], [place] W, NP               ondansetron (ZOFRAN-ODT) 8 MG disintegrating tablet 30 tablet 0    Sig: TAKE 1 TABLET (8 MG TOTAL) BY MOUTH EVERY 8 (EIGHT) HOURS AS NEEDED FOR NAUSEA OR VOMITING.     Not Delegated - Gastroenterology: Antiemetics - ondansetron Failed - 08/20/2021  4:03 PM       Failed - This refill cannot be delegated      Failed - ALT in normal range and within 360 days    ALT  Date Value Ref Range Status  03/20/2020 16 0 - 44 IU/L Final          Passed - AST in normal range and within 360 days    AST  Date Value Ref Range Status  02/26/2021 19 0 - 40 IU/L Final          Passed - Valid encounter within last 6 months    Recent Outpatient Visits           3 months ago Hospital discharge follow-up   [place], Vernia Buff, NP   1 year ago Benign paroxysmal positional vertigo, unspecified laterality   [place], Vernia Buff, NP   1 year ago [place], [place]   1 year ago Benign paroxysmal positional vertigo, unspecified laterality   [place], [place] W, NP   2 years ago Benign paroxysmal positional vertigo, unspecified laterality   [place], Vernia Buff, NP

## 2021-08-23 ENCOUNTER — Other Ambulatory Visit: Payer: Self-pay

## 2021-08-26 ENCOUNTER — Other Ambulatory Visit: Payer: Self-pay

## 2021-08-27 ENCOUNTER — Other Ambulatory Visit: Payer: Self-pay

## 2021-12-06 ENCOUNTER — Ambulatory Visit: Payer: Self-pay | Attending: Nurse Practitioner | Admitting: Nurse Practitioner

## 2021-12-06 ENCOUNTER — Encounter: Payer: Self-pay | Admitting: Nurse Practitioner

## 2021-12-06 ENCOUNTER — Other Ambulatory Visit: Payer: Self-pay

## 2021-12-06 ENCOUNTER — Other Ambulatory Visit: Payer: Self-pay | Admitting: Pharmacist

## 2021-12-06 VITALS — BP 138/83 | HR 55 | Ht 71.0 in | Wt 235.6 lb

## 2021-12-06 DIAGNOSIS — K219 Gastro-esophageal reflux disease without esophagitis: Secondary | ICD-10-CM

## 2021-12-06 DIAGNOSIS — F32A Depression, unspecified: Secondary | ICD-10-CM

## 2021-12-06 DIAGNOSIS — R109 Unspecified abdominal pain: Secondary | ICD-10-CM

## 2021-12-06 DIAGNOSIS — R110 Nausea: Secondary | ICD-10-CM

## 2021-12-06 DIAGNOSIS — L301 Dyshidrosis [pompholyx]: Secondary | ICD-10-CM

## 2021-12-06 DIAGNOSIS — J4552 Severe persistent asthma with status asthmaticus: Secondary | ICD-10-CM

## 2021-12-06 DIAGNOSIS — R42 Dizziness and giddiness: Secondary | ICD-10-CM

## 2021-12-06 DIAGNOSIS — R1084 Generalized abdominal pain: Secondary | ICD-10-CM

## 2021-12-06 DIAGNOSIS — F5104 Psychophysiologic insomnia: Secondary | ICD-10-CM

## 2021-12-06 DIAGNOSIS — F419 Anxiety disorder, unspecified: Secondary | ICD-10-CM

## 2021-12-06 MED ORDER — ALBUTEROL SULFATE HFA 108 (90 BASE) MCG/ACT IN AERS
2.0000 | INHALATION_SPRAY | RESPIRATORY_TRACT | 2 refills | Status: DC | PRN
  Filled 2021-12-06: qty 18, 16d supply, fill #0
  Filled 2022-05-13: qty 6.7, 17d supply, fill #0
  Filled 2022-08-27: qty 18, 25d supply, fill #1
  Filled 2022-10-07: qty 18, 25d supply, fill #2

## 2021-12-06 MED ORDER — PANTOPRAZOLE SODIUM 40 MG PO TBEC
DELAYED_RELEASE_TABLET | Freq: Every day | ORAL | 6 refills | Status: DC
  Filled 2021-12-06: qty 30, 30d supply, fill #0

## 2021-12-06 MED ORDER — HYDROXYZINE HCL 25 MG PO TABS
25.0000 mg | ORAL_TABLET | Freq: Three times a day (TID) | ORAL | 6 refills | Status: DC | PRN
  Filled 2021-12-06: qty 60, 20d supply, fill #0
  Filled 2022-07-21: qty 60, 20d supply, fill #1
  Filled 2022-10-07: qty 60, 20d supply, fill #2

## 2021-12-06 MED ORDER — BETAMETHASONE VALERATE 0.1 % EX OINT
1.0000 "application " | TOPICAL_OINTMENT | Freq: Two times a day (BID) | CUTANEOUS | 3 refills | Status: DC
  Filled 2021-12-06: qty 30, 15d supply, fill #0
  Filled 2022-04-29 – 2022-05-13 (×2): qty 30, 15d supply, fill #1
  Filled 2022-07-21 – 2022-08-27 (×2): qty 30, 15d supply, fill #2
  Filled 2022-10-07: qty 30, 15d supply, fill #3
  Filled 2022-10-24 – 2022-10-31 (×2): qty 30, 15d supply, fill #4

## 2021-12-06 MED ORDER — FLUTICASONE-SALMETEROL 100-50 MCG/ACT IN AEPB
1.0000 | INHALATION_SPRAY | Freq: Two times a day (BID) | RESPIRATORY_TRACT | 2 refills | Status: DC
  Filled 2021-12-06: qty 60, 30d supply, fill #0

## 2021-12-06 MED ORDER — DULERA 100-5 MCG/ACT IN AERO
2.0000 | INHALATION_SPRAY | Freq: Two times a day (BID) | RESPIRATORY_TRACT | 2 refills | Status: DC
  Filled 2021-12-06: qty 13, 30d supply, fill #0

## 2021-12-06 MED ORDER — ONDANSETRON 8 MG PO TBDP
ORAL_TABLET | Freq: Three times a day (TID) | ORAL | 3 refills | Status: DC | PRN
Start: 2021-12-06 — End: 2023-02-05
  Filled 2021-12-06: qty 30, 10d supply, fill #0
  Filled 2022-07-21: qty 30, 10d supply, fill #1
  Filled 2022-08-27: qty 30, 10d supply, fill #2
  Filled 2022-10-07: qty 30, 10d supply, fill #3

## 2021-12-06 MED ORDER — PREDNISONE 20 MG PO TABS
40.0000 mg | ORAL_TABLET | Freq: Every day | ORAL | 0 refills | Status: AC
  Filled 2021-12-06: qty 10, 5d supply, fill #0

## 2021-12-06 MED ORDER — CITALOPRAM HYDROBROMIDE 10 MG PO TABS
10.0000 mg | ORAL_TABLET | Freq: Every day | ORAL | 6 refills | Status: DC
  Filled 2021-12-06: qty 30, 30d supply, fill #0

## 2021-12-06 MED ORDER — TRAZODONE HCL 50 MG PO TABS
50.0000 mg | ORAL_TABLET | Freq: Every evening | ORAL | 6 refills | Status: DC | PRN
  Filled 2021-12-06: qty 30, 30d supply, fill #0

## 2021-12-06 MED ORDER — MONTELUKAST SODIUM 10 MG PO TABS
10.0000 mg | ORAL_TABLET | Freq: Every day | ORAL | 6 refills | Status: DC
Start: 2021-12-06 — End: 2022-07-22
  Filled 2021-12-06: qty 30, 30d supply, fill #0

## 2021-12-06 NOTE — Progress Notes (Signed)
Medication refills Right big toe pain.

## 2021-12-06 NOTE — Progress Notes (Signed)
Assessment & Plan:  Adrian [name] was seen today for asthma.  Diagnoses and all orders for this visit:  Eczema, dyshidrotic -     betamethasone valerate ointment (VALISONE) 0.1 %; Apply 1 application  topically 2 (two) times daily.  Generalized abdominal pain -     pantoprazole (PROTONIX) 40 MG tablet; TAKE 1 TABLET (40 MG TOTAL) BY MOUTH DAILY.  Gastroesophageal reflux disease without esophagitis -     pantoprazole (PROTONIX) 40 MG tablet; TAKE 1 TABLET (40 MG TOTAL) BY MOUTH DAILY.  Nausea -     ondansetron (ZOFRAN-ODT) 8 MG disintegrating tablet; TAKE 1 TABLET (8 MG TOTAL) BY MOUTH EVERY 8 (EIGHT) HOURS AS NEEDED FOR NAUSEA OR VOMITING.  Psychophysiological insomnia -     traZODone (DESYREL) 50 MG tablet; Take 1 tablet (50 mg total) by mouth at bedtime as needed for sleep.  Anxiety and depression -     hydrOXYzine (ATARAX) 25 MG tablet; Take 1 tablet (25 mg total) by mouth every 8 (eight) hours as needed for anxiety. -     citalopram (CELEXA) 10 MG tablet; Take 1 tablet (10 mg total) by mouth daily. For depression  Dizziness -     Thyroid Panel With TSH -     CBC with Differential  Flank pain -     CMP14+EGFR -     CBC with Differential -     Urinalysis, Complete -     Urinalysis, Complete  Severe persistent asthma with status asthmaticus -     montelukast (SINGULAIR) 10 MG tablet; Take 1 tablet (10 mg total) by mouth at bedtime. -     albuterol (VENTOLIN HFA) 108 (90 Base) MCG/ACT inhaler; Inhale 2 puffs into the lungs every 4 (four) hours as needed for wheezing or shortness of breath. -     predniSONE (DELTASONE) 20 MG tablet; Take 2 tablets (40 mg total) by mouth daily with breakfast for 5 days.    Patient has been counseled on age-appropriate routine health concerns for screening and prevention. These are reviewed and up-to-date. Referrals have been placed accordingly. Immunizations are up-to-date or declined.    Subjective:   Chief Complaint  Patient presents with    Asthma   HPI Adrian [name] 46 y.o. male presents to office today for follow-up to add, anxiety and depression with insomnia and severe eczema. I have encouraged him to apply for the financial assistance.  Asthma: Patient's symptoms include dyspnea, non-productive cough, and wheezing. Associated symptoms include  none . The patient has been suffering from these symptoms for a few weeks. Symptoms have been unchanged since their onset. Medications used in the past to treat these symptoms include  dulera, flovent, breo . Suspected precipitants include cold air, occupational exposure, pollens, and temperature changes, season change . Patient has required Emergency Room treatment for these symptoms, and has not required hospitalization since November 2022. The patient has not been intubated in the past but has required BIPAP.   Anxiety and Depression He has not been taking his citalopram as prescribed.  Ran out of trazodone and does not feel hydroxyzine is very effective with relieving his anxiety.  Constantly feels as if something bad is going to happen to him or he is going to be diagnosed with something terminal. Denies any thoughts of self-harm at this time.   Eczema He has been on several different types of steroid creams and ointments as well as Cetaphil cream and fluocinolone oil for his severe eczema including triamcinolone and Valisone. States  the topical creams are ineffective and he prefers ointments.   GERD He has chronic GERD with associated nausea.  Prescribed Zofran and pantoprazole for this however is currently not on either medication.   Review of Systems  Constitutional:  Negative for fever, malaise/fatigue and weight loss.  HENT: Negative.  Negative for nosebleeds.   Eyes: Negative.  Negative for blurred vision, double vision and photophobia.  Respiratory:  Positive for cough, shortness of breath and wheezing.   Cardiovascular: Negative.  Negative for chest pain, palpitations  and leg swelling.  Gastrointestinal:  Positive for heartburn and nausea. Negative for vomiting.  Musculoskeletal: Negative.  Negative for myalgias.  Skin:  Positive for itching and rash.  Neurological: Negative.  Negative for dizziness, focal weakness, seizures and headaches.  Psychiatric/Behavioral:  Positive for depression. Negative for suicidal ideas. The patient is nervous/anxious and has insomnia.     Past Medical History:  Diagnosis Date   Asthma    at birth   Eczema    Environmental allergies    grass   Foot fracture, left    Heart murmur    Migraine    Multiple food allergies    fish, tomatoes, peanuts, eggs, chicken and others    Past Surgical History:  Procedure Laterality Date   INGUINAL HERNIA REPAIR  1994    Family History  Problem Relation Age of Onset   Asthma Father    Diabetes Mother    Hypertension Mother    Clotting disorder Other        Aunt   Breast cancer Other        Grandmother   Prostate cancer Other        uncle   Hyperlipidemia Other        grandparent   Hypertension Other        grandparent/grandparent   Kidney disease Other        aunt/uncle/grandparent   Stroke Other        grandparent    Social History Reviewed with no changes to be made today.   Outpatient Medications Prior to Visit  Medication Sig Dispense Refill   meclizine (ANTIVERT) 25 MG tablet Take 1 tablet (25 mg total) by mouth 3 (three) times daily. 30 tablet 2   Vitamin D, Ergocalciferol, (DRISDOL) 1.25 MG (50000 UNIT) CAPS capsule TAKE 1 CAPSULE (50,000 UNITS TOTAL) BY MOUTH EVERY 7 (SEVEN) DAYS. 4 capsule 2   albuterol (VENTOLIN HFA) 108 (90 Base) MCG/ACT inhaler Inhale 2 puffs into the lungs every 4 (four) hours as needed for wheezing or shortness of breath. 18 g 2   albuterol (VENTOLIN HFA) 108 (90 Base) MCG/ACT inhaler Inhale 1-2 puffs into the lungs every 4 (four) hours as needed for wheezing or shortness of breath. 18 g 2   citalopram (CELEXA) 10 MG tablet Take 1  tablet (10 mg total) by mouth daily. 30 tablet 3   hydrOXYzine (ATARAX) 25 MG tablet TAKE 1/2 TO 1 TABLET BY MOUTH EVERY 8 HOURS AS NEEDED FOR ANXIETY. (Patient taking differently: Take 25 mg by mouth every 8 (eight) hours as needed for anxiety.) 60 tablet 1   mometasone-formoterol (DULERA) 100-5 MCG/ACT AERO Inhale 2 puffs into the lungs 2 (two) times daily. 13 g 2   montelukast (SINGULAIR) 10 MG tablet Take 1 tablet (10 mg total) by mouth at bedtime. 30 tablet 2   ondansetron (ZOFRAN-ODT) 8 MG disintegrating tablet TAKE 1 TABLET (8 MG TOTAL) BY MOUTH EVERY 8 (EIGHT) HOURS AS NEEDED FOR NAUSEA OR  VOMITING. 30 tablet 0   pantoprazole (PROTONIX) 40 MG tablet TAKE 1 TABLET (40 MG TOTAL) BY MOUTH DAILY. 30 tablet 3   traZODone (DESYREL) 50 MG tablet Take 0.5-1 tablets (25-50 mg total) by mouth at bedtime as needed for sleep. 30 tablet 3   triamcinolone cream (KENALOG) 0.1 % Apply 1 application topically 2 (two) times daily. 30 g 0   No facility-administered medications prior to visit.    Allergies  Allergen Reactions   Apple Juice Shortness Of Breath, Itching, Nausea Only and Swelling    Tongue itches also   Atrovent [Ipratropium] Anaphylaxis    Atrovent contains peanut oil and PT has a HX of anaphlaxis to peanuts   Banana Shortness Of Breath and Itching    Makes tongue itch   Carrot Oil Shortness Of Breath and Itching   Fish Allergy Anaphylaxis, Hives and Swelling   Fruit & Vegetable Daily [Nutritional Supplements] Shortness Of Breath and Itching    Patient stated that he allergic to most all fruits   Peanut-Containing Drug Products Anaphylaxis, Hives, Nausea And Vomiting and Swelling   Shellfish Allergy Anaphylaxis, Hives, Swelling and Other (See Comments)    "ALL SEAFOOD"   Tomato Anaphylaxis, Hives and Swelling   Chicken Meat (Diagnostic) Other (See Comments)    Tested allergic to this years ago   Latex Itching    No powdered gloves!!   Pork-Derived Products Other (See Comments)     CAUSES SEVERE HEADACHES   Soy Allergy Other (See Comments)    Tested allergic to this years ago   Grass Extracts [Gramineae Pollens] Hives, Rash and Other (See Comments)    Wheezing also   Tree Extract Other (See Comments)    Patient tested allergic to this years ago       Objective:    BP 138/83   Pulse (!) 55   Ht _0  (1.803 m)   Wt 235 lb 9.6 oz (106.9 kg)   SpO2 99%   BMI 32.86 kg/m  Wt Readings from Last 3 Encounters:  12/06/21 235 lb 9.6 oz (106.9 kg)  05/22/21 252 lb (114.3 kg)  02/26/21 253 lb (114.8 kg)    Physical Exam Vitals and nursing note reviewed.  Constitutional:      Appearance: He is well-developed.  HENT:     Head: Normocephalic and atraumatic.  Cardiovascular:     Rate and Rhythm: Normal rate and regular rhythm.     Heart sounds: Normal heart sounds. No murmur heard.    No friction rub. No gallop.  Pulmonary:     Effort: Pulmonary effort is normal. No tachypnea or respiratory distress.     Breath sounds: Wheezing present. No decreased breath sounds, rhonchi or rales.  Chest:     Chest wall: No tenderness.  Abdominal:     General: Bowel sounds are normal.     Palpations: Abdomen is soft.  Musculoskeletal:        General: Normal range of motion.     Cervical back: Normal range of motion.  Skin:    General: Skin is warm and dry.     Findings: Rash present. Rash is macular.     Comments: Patient has severe eczema on bilateral hands, arms and legs  Neurological:     Mental Status: He is alert and oriented to person, place, and time.     Coordination: Coordination normal.  Psychiatric:        Attention and Perception: Attention normal.  Mood and Affect: Mood normal.        Speech: Speech normal.        Behavior: Behavior normal. Behavior is cooperative.        Thought Content: Thought content normal.        Cognition and Memory: Cognition normal.        Judgment: Judgment normal.          Patient has been counseled extensively  about nutrition and exercise as well as the importance of adherence with medications and regular follow-up. The patient was given clear instructions to go to ER or return to medical center if symptoms don't improve, worsen or new problems develop. The patient verbalized understanding.   Follow-up: Return in about 3 months (around 03/08/2022).   Gildardo Pounds, FNP-BC [facility] and [place], [place]   12/06/2021, 12:51 PM

## 2021-12-07 LAB — MICROSCOPIC EXAMINATION
Bacteria, UA: NONE SEEN
Casts: NONE SEEN /lpf

## 2021-12-07 LAB — CBC WITH DIFFERENTIAL/PLATELET
Basophils Absolute: 0 10*3/uL (ref 0.0–0.2)
Basos: 0 %
EOS (ABSOLUTE): 0.1 10*3/uL (ref 0.0–0.4)
Eos: 1 %
Hematocrit: 45.2 % (ref 37.5–51.0)
Hemoglobin: 15 g/dL (ref 13.0–17.7)
Immature Grans (Abs): 0 10*3/uL (ref 0.0–0.1)
Immature Granulocytes: 0 %
Lymphocytes Absolute: 2.8 10*3/uL (ref 0.7–3.1)
Lymphs: 29 %
MCH: 26.6 pg (ref 26.6–33.0)
MCHC: 33.2 g/dL (ref 31.5–35.7)
MCV: 80 fL (ref 79–97)
Monocytes Absolute: 0.8 10*3/uL (ref 0.1–0.9)
Monocytes: 8 %
Neutrophils Absolute: 5.9 10*3/uL (ref 1.4–7.0)
Neutrophils: 62 %
Platelets: 313 10*3/uL (ref 150–450)
RBC: 5.63 x10E6/uL (ref 4.14–5.80)
RDW: 13.2 % (ref 11.6–15.4)
WBC: 9.5 10*3/uL (ref 3.4–10.8)

## 2021-12-07 LAB — THYROID PANEL WITH TSH
Free Thyroxine Index: 1.8 (ref 1.2–4.9)
T3 Uptake Ratio: 23 % — ABNORMAL LOW (ref 24–39)
T4, Total: 7.7 ug/dL (ref 4.5–12.0)
TSH: 1.31 u[IU]/mL (ref 0.450–4.500)

## 2021-12-07 LAB — URINALYSIS, COMPLETE
Bilirubin, UA: NEGATIVE
Glucose, UA: NEGATIVE
Nitrite, UA: NEGATIVE
RBC, UA: NEGATIVE
Specific Gravity, UA: >=1.03 — AB (ref 1.005–1.030)
Urobilinogen, Ur: 0.2 mg/dL (ref 0.2–1.0)
pH, UA: 6 (ref 5.0–7.5)

## 2021-12-07 LAB — CMP14+EGFR
ALT: 16 IU/L (ref 0–44)
AST: 17 IU/L (ref 0–40)
Albumin/Globulin Ratio: 1.6 (ref 1.2–2.2)
Albumin: 4.7 g/dL (ref 4.0–5.0)
Alkaline Phosphatase: 79 IU/L (ref 44–121)
BUN/Creatinine Ratio: 8 — ABNORMAL LOW (ref 9–20)
BUN: 10 mg/dL (ref 6–24)
Bilirubin Total: 0.4 mg/dL (ref 0.0–1.2)
CO2: 25 mmol/L (ref 20–29)
Calcium: 9.8 mg/dL (ref 8.7–10.2)
Chloride: 102 mmol/L (ref 96–106)
Creatinine, Ser: 1.28 mg/dL — ABNORMAL HIGH (ref 0.76–1.27)
Globulin, Total: 3 g/dL (ref 1.5–4.5)
Glucose: 82 mg/dL (ref 70–99)
Potassium: 4.3 mmol/L (ref 3.5–5.2)
Sodium: 140 mmol/L (ref 134–144)
Total Protein: 7.7 g/dL (ref 6.0–8.5)
eGFR: 70 mL/min/{1.73_m2} (ref >59)

## 2021-12-09 ENCOUNTER — Other Ambulatory Visit: Payer: Self-pay | Admitting: Nurse Practitioner

## 2021-12-09 DIAGNOSIS — R8290 Unspecified abnormal findings in urine: Secondary | ICD-10-CM

## 2021-12-09 DIAGNOSIS — Z114 Encounter for screening for human immunodeficiency virus [HIV]: Secondary | ICD-10-CM

## 2021-12-10 ENCOUNTER — Other Ambulatory Visit: Payer: Self-pay

## 2021-12-10 ENCOUNTER — Other Ambulatory Visit (HOSPITAL_COMMUNITY)
Admission: RE | Admit: 2021-12-10 | Discharge: 2021-12-10 | Disposition: A | Discharge status: Home / Self Care | Payer: Self-pay | Source: Ambulatory Visit | Attending: Nurse Practitioner | Admitting: Nurse Practitioner

## 2021-12-10 ENCOUNTER — Ambulatory Visit: Payer: Self-pay | Attending: Nurse Practitioner

## 2021-12-10 DIAGNOSIS — R8290 Unspecified abnormal findings in urine: Secondary | ICD-10-CM

## 2021-12-10 DIAGNOSIS — Z114 Encounter for screening for human immunodeficiency virus [HIV]: Secondary | ICD-10-CM

## 2021-12-11 LAB — HIV ANTIBODY (ROUTINE TESTING W REFLEX): HIV Screen 4th Generation wRfx: NONREACTIVE

## 2021-12-11 LAB — PSA: Prostate Specific Ag, Serum: 1.5 ng/mL (ref 0.0–4.0)

## 2021-12-12 LAB — URINE CYTOLOGY ANCILLARY ONLY
Bacterial Vaginitis-Urine: NEGATIVE
Candida Urine: NEGATIVE
Chlamydia: NEGATIVE
Comment: NEGATIVE
Comment: NEGATIVE
Comment: NORMAL
Neisseria Gonorrhea: NEGATIVE
Trichomonas: POSITIVE — AB

## 2021-12-13 ENCOUNTER — Other Ambulatory Visit: Payer: Self-pay

## 2021-12-13 ENCOUNTER — Telehealth: Payer: Self-pay | Admitting: Nurse Practitioner

## 2021-12-13 ENCOUNTER — Other Ambulatory Visit: Payer: Self-pay | Admitting: Nurse Practitioner

## 2021-12-13 DIAGNOSIS — A599 Trichomoniasis, unspecified: Secondary | ICD-10-CM

## 2021-12-13 MED ORDER — METRONIDAZOLE 500 MG PO TABS
500.0000 mg | ORAL_TABLET | Freq: Two times a day (BID) | ORAL | 0 refills | Status: DC
  Filled 2021-12-13: qty 14, 7d supply, fill #0

## 2021-12-13 NOTE — Telephone Encounter (Signed)
Pt is calling to see if his results are back for Urine cytology ancillary only Please advise  CB- [phone]

## 2021-12-14 NOTE — Telephone Encounter (Signed)
Medication sent.

## 2021-12-16 ENCOUNTER — Other Ambulatory Visit: Payer: Self-pay

## 2021-12-16 NOTE — Telephone Encounter (Unsigned)
Copied from [place] [id number]. Topic: General - Other >> Dec 16, 2021 12:35 PM Rudene Anda wrote: Reason for CRM: Pt called in to go over his latest lab results, please advise.

## 2021-12-17 NOTE — Telephone Encounter (Signed)
Patient lab results addressed with patient on yesterday, 12/16/2021

## 2022-04-25 ENCOUNTER — Other Ambulatory Visit: Payer: Self-pay

## 2022-04-29 ENCOUNTER — Other Ambulatory Visit: Payer: Self-pay

## 2022-04-29 ENCOUNTER — Other Ambulatory Visit: Payer: Self-pay | Admitting: Physician Assistant

## 2022-04-29 DIAGNOSIS — L301 Dyshidrosis [pompholyx]: Secondary | ICD-10-CM

## 2022-04-30 ENCOUNTER — Other Ambulatory Visit: Payer: Self-pay

## 2022-05-07 ENCOUNTER — Other Ambulatory Visit: Payer: Self-pay

## 2022-05-13 ENCOUNTER — Other Ambulatory Visit: Payer: Self-pay

## 2022-05-14 ENCOUNTER — Other Ambulatory Visit: Payer: Self-pay

## 2022-07-21 ENCOUNTER — Ambulatory Visit: Payer: Self-pay

## 2022-07-21 ENCOUNTER — Other Ambulatory Visit: Payer: Self-pay

## 2022-07-21 NOTE — Telephone Encounter (Signed)
essage from Erick Blinks sent at 07/21/2022 10:38 AM EST  Summary: Dizziness + Nausea   Pt is experiencing dizziness, believes it could be STD related. Nausea as well.         Chief Complaint: dizziness and nausea Symptoms: irregular heartbeat and forceful beats that cause pain,  Frequency: 1 week Pertinent Negatives: Patient denies pain to left arm, neck, jaw, back, sweating Disposition: '[x]'$ ED /'[]'$ Urgent Care (no appt availability in office) / '[]'$ Appointment(In office/virtual)/ '[]'$  [facility]/ '[]'$ Home Care/ '[]'$ Refused Recommended Disposition /'[]'$ [facility]/ '[]'$  Follow-up with PCP Additional Notes: Advised pt to go to ED for evaluation as soon as possible- pt stated that he will go around 1500 that he could not go any sooner.   Reason for Disposition  Extra heartbeats, irregular heart beating, or heart is beating very fast  (i.e., "palpitations")  Difficulty breathing  Answer Assessment - Initial Assessment Questions 1. DESCRIPTION: "Describe your dizziness."     lightheaded 2. LIGHTHEADED: "Do you feel lightheaded?" (e.g., somewhat faint, woozy, weak upon standing)     yes 3. VERTIGO: "Do you feel like either you or the room is spinning or tilting?" (i.e. vertigo)     ocaasionally 4. SEVERITY: "How bad is it?"  "Do you feel like you are going to faint?" "Can you stand and walk?"   - MILD: Feels slightly dizzy, but walking normally.   - MODERATE: Feels unsteady when walking, but not falling; interferes with normal activities (e.g., school, work).   - SEVERE: Unable to walk without falling, or requires assistance to walk without falling; feels like passing out now.      Occasionally moderate 5. ONSET:  "When did the dizziness begin?"     1 week 6. AGGRAVATING FACTORS: "Does anything make it worse?" (e.g., standing, change in head position)     Standing- laying bed 7. HEART RATE: "Can you tell me your heart rate?" "How many beats in 15 seconds?"  (Note: not all  patients can do this)       Pt stated the heart rate is irregular 8. CAUSE: "What do you think is causing the dizziness?"     STI 9. RECURRENT SYMPTOM: "Have you had dizziness before?" If Yes, ask: "When was the last time?" "What happened that time?"     Yes 2 years 10. OTHER SYMPTOMS: "Do you have any other symptoms?" (e.g., fever, chest pain, vomiting, diarrhea, bleeding)       Heart skipping beats - 1 week  11. PREGNANCY: "Is there any chance you are pregnant?" "When was your last menstrual period?"       N/a  Protocols used: Dizziness - Lightheadedness-A-AH, Heart Rate and Heartbeat Questions-A-AH

## 2022-07-21 NOTE — Telephone Encounter (Addendum)
Spoke with patient . Patient voiced that he thinks he may have STD,because this is how he felt with his last STD. Requested an office visit  for today , no availably for today. Appointment scheduled for 07/22/2022 with Z. Raul Del, NP.   Patient Denies any chest pain. Continues to c/o dizziness. Advised patient that if s/s worsen to got  to  the ER. Patient voices that he feels like he can wait to see PCP on tomorrow.

## 2022-07-21 NOTE — Telephone Encounter (Signed)
Noted  

## 2022-07-22 ENCOUNTER — Other Ambulatory Visit: Payer: Self-pay

## 2022-07-22 ENCOUNTER — Other Ambulatory Visit (HOSPITAL_COMMUNITY)
Admission: RE | Admit: 2022-07-22 | Discharge: 2022-07-22 | Disposition: A | Discharge status: Home / Self Care | Payer: Self-pay | Source: Ambulatory Visit | Attending: Nurse Practitioner | Admitting: Nurse Practitioner

## 2022-07-22 ENCOUNTER — Ambulatory Visit: Payer: Self-pay | Attending: Nurse Practitioner | Admitting: Nurse Practitioner

## 2022-07-22 ENCOUNTER — Encounter: Payer: Self-pay | Admitting: Nurse Practitioner

## 2022-07-22 VITALS — BP 122/76 | HR 50 | Ht 71.0 in | Wt 243.8 lb

## 2022-07-22 DIAGNOSIS — R110 Nausea: Secondary | ICD-10-CM

## 2022-07-22 DIAGNOSIS — R011 Cardiac murmur, unspecified: Secondary | ICD-10-CM

## 2022-07-22 DIAGNOSIS — Z7251 High risk heterosexual behavior: Secondary | ICD-10-CM

## 2022-07-22 DIAGNOSIS — R42 Dizziness and giddiness: Secondary | ICD-10-CM

## 2022-07-22 DIAGNOSIS — R001 Bradycardia, unspecified: Secondary | ICD-10-CM

## 2022-07-22 DIAGNOSIS — Z114 Encounter for screening for human immunodeficiency virus [HIV]: Secondary | ICD-10-CM

## 2022-07-22 DIAGNOSIS — Z7951 Long term (current) use of inhaled steroids: Secondary | ICD-10-CM | POA: Insufficient documentation

## 2022-07-22 DIAGNOSIS — Z79899 Other long term (current) drug therapy: Secondary | ICD-10-CM | POA: Insufficient documentation

## 2022-07-22 DIAGNOSIS — K219 Gastro-esophageal reflux disease without esophagitis: Secondary | ICD-10-CM

## 2022-07-22 DIAGNOSIS — J4552 Severe persistent asthma with status asthmaticus: Secondary | ICD-10-CM

## 2022-07-22 MED ORDER — MECLIZINE HCL 25 MG PO TABS
25.0000 mg | ORAL_TABLET | Freq: Three times a day (TID) | ORAL | 2 refills | Status: DC
  Filled 2022-07-22: qty 30, 10d supply, fill #0

## 2022-07-22 MED ORDER — PANTOPRAZOLE SODIUM 40 MG PO TBEC
40.0000 mg | DELAYED_RELEASE_TABLET | Freq: Every day | ORAL | 1 refills | Status: DC
  Filled 2022-07-22 – 2022-08-27 (×2): qty 90, 90d supply, fill #0

## 2022-07-22 MED ORDER — MONTELUKAST SODIUM 10 MG PO TABS
10.0000 mg | ORAL_TABLET | Freq: Every day | ORAL | 1 refills | Status: DC
  Filled 2022-07-22: qty 90, 90d supply, fill #0

## 2022-07-22 MED ORDER — FLUTICASONE-SALMETEROL 100-50 MCG/ACT IN AEPB
1.0000 | INHALATION_SPRAY | Freq: Two times a day (BID) | RESPIRATORY_TRACT | 2 refills | Status: DC
  Filled 2022-07-22: qty 60, 30d supply, fill #0

## 2022-07-22 NOTE — Progress Notes (Signed)
Assessment & Plan:  [name] was seen today for std testing.  Diagnoses and all orders for this visit:  High risk heterosexual behavior -     Urine cytology ancillary only  Severe persistent asthma with status asthmaticus -     montelukast (SINGULAIR) 10 MG tablet; Take 1 tablet (10 mg total) by mouth at bedtime. For asthma -     fluticasone-salmeterol (ADVAIR DISKUS) 100-50 MCG/ACT AEPB; Inhale 1 puff into the lungs 2 (two) times daily.  Bradycardia -     Thyroid Panel With TSH -     Ambulatory referral to Cardiology  Encounter for screening for HIV -     HIV antibody (with reflex)  Heart murmur -     Ambulatory referral to Cardiology  Severe persistent asthma with status asthmaticus CONTROLLED -     montelukast (SINGULAIR) 10 MG tablet; Take 1 tablet (10 mg total) by mouth at bedtime. For asthma -     fluticasone-salmeterol (ADVAIR DISKUS) 100-50 MCG/ACT AEPB; Inhale 1 puff into the lungs 2 (two) times daily.  Gastroesophageal reflux disease without esophagitis -     pantoprazole (PROTONIX) 40 MG tablet; Take 1 tablet (40 mg total) by mouth daily.  Dizziness -     meclizine (ANTIVERT) 25 MG tablet; Take 1 tablet (25 mg total) by mouth 3 (three) times daily. For dizziness  Nausea -     meclizine (ANTIVERT) 25 MG tablet; Take 1 tablet (25 mg total) by mouth 3 (three) times daily. For dizziness    Patient has been counseled on age-appropriate routine health concerns for screening and prevention. These are reviewed and up-to-date. Referrals have been placed accordingly. Immunizations are up-to-date or declined.    Subjective:   Chief Complaint  Patient presents with   STD TESTING   HPI Adrian [name] 47 y.o. male presents to office today requesting STD testing and with concerns of heart pounding in chest. He does have a history of asthma and uses his inhalers as prescribed.   Cardiac Patient complains of chest discomfort. Onset was  over  a year ago, with unchanged  course since that time. The patient describes the discomfort as intermittent, awakening patient from sleep, pressure like in nature, does not radiate.  Associated symptoms are  dizziness . Aggravating factors are none.  Alleviating factors are: none. Patient's cardiac risk factors are none.  Patient's risk factors for DVT/PE: none. Previous cardiac testing: electrocardiogram (ECG): Sinus brady.     Review of Systems  Constitutional:  Negative for fever, malaise/fatigue and weight loss.  HENT: Negative.  Negative for nosebleeds.   Eyes: Negative.  Negative for blurred vision, double vision and photophobia.  Respiratory: Negative.  Negative for cough and shortness of breath.   Cardiovascular:  Negative for chest pain, palpitations and leg swelling.       SEE HPI  Gastrointestinal:  Positive for nausea. Negative for heartburn and vomiting.  Musculoskeletal: Negative.  Negative for myalgias.  Neurological:  Positive for dizziness. Negative for focal weakness, seizures and headaches.  Psychiatric/Behavioral: Negative.  Negative for suicidal ideas.     Past Medical History:  Diagnosis Date   Asthma    at birth   Eczema    Environmental allergies    grass   Foot fracture, left    Heart murmur    Migraine    Multiple food allergies    fish, tomatoes, peanuts, eggs, chicken and others    Past Surgical History:  Procedure Laterality Date   INGUINAL HERNIA  REPAIR  1994    Family History  Problem Relation Age of Onset   Asthma Father    Diabetes Mother    Hypertension Mother    Clotting disorder Other        Aunt   Breast cancer Other        Grandmother   Prostate cancer Other        uncle   Hyperlipidemia Other        grandparent   Hypertension Other        grandparent/grandparent   Kidney disease Other        aunt/uncle/grandparent   Stroke Other        grandparent    Social History Reviewed with no changes to be made today.   Outpatient Medications Prior to Visit   Medication Sig Dispense Refill   albuterol (VENTOLIN HFA) 108 (90 Base) MCG/ACT inhaler Inhale 2 puffs into the lungs every 4 (four) hours as needed for wheezing or shortness of breath. 18 g 2   betamethasone valerate ointment (VALISONE) 0.1 % Apply 1 application  topically 2 (two) times daily. 100 g 3   hydrOXYzine (ATARAX) 25 MG tablet Take 1 tablet (25 mg total) by mouth every 8 (eight) hours as needed for anxiety. 60 tablet 6   ondansetron (ZOFRAN-ODT) 8 MG disintegrating tablet TAKE 1 TABLET (8 MG TOTAL) BY MOUTH EVERY 8 (EIGHT) HOURS AS NEEDED FOR NAUSEA OR VOMITING. 30 tablet 3   traZODone (DESYREL) 50 MG tablet Take 1 tablet (50 mg total) by mouth at bedtime as needed for sleep. 30 tablet 6   citalopram (CELEXA) 10 MG tablet Take 1 tablet (10 mg total) by mouth daily. For depression (Patient not taking: Reported on 07/22/2022) 30 tablet 6   fluticasone-salmeterol (ADVAIR DISKUS) 100-50 MCG/ACT AEPB Inhale 1 puff into the lungs 2 (two) times daily. (Patient not taking: Reported on 07/22/2022) 60 each 2   meclizine (ANTIVERT) 25 MG tablet Take 1 tablet (25 mg total) by mouth 3 (three) times daily. (Patient not taking: Reported on 07/22/2022) 30 tablet 2   metroNIDAZOLE (FLAGYL) 500 MG tablet Take 1 tablet (500 mg total) by mouth 2 (two) times dail for 7 days. (Patient not taking: Reported on 07/22/2022) 14 tablet 0   montelukast (SINGULAIR) 10 MG tablet Take 1 tablet (10 mg total) by mouth at bedtime. (Patient not taking: Reported on 07/22/2022) 30 tablet 6   pantoprazole (PROTONIX) 40 MG tablet TAKE 1 TABLET (40 MG TOTAL) BY MOUTH DAILY. (Patient not taking: Reported on 07/22/2022) 30 tablet 6   No facility-administered medications prior to visit.    Allergies  Allergen Reactions   Apple Juice Shortness Of Breath, Itching, Nausea Only and Swelling    Tongue itches also   Atrovent [Ipratropium] Anaphylaxis    Atrovent contains peanut oil and PT has a HX of anaphlaxis to peanuts   Banana  Shortness Of Breath and Itching    Makes tongue itch   Carrot Oil Shortness Of Breath and Itching   Fish Allergy Anaphylaxis, Hives and Swelling   Fruit & Vegetable Daily [Nutritional Supplements] Shortness Of Breath and Itching    Patient stated that he allergic to most all fruits   Peanut-Containing Drug Products Anaphylaxis, Hives, Nausea And Vomiting and Swelling   Shellfish Allergy Anaphylaxis, Hives, Swelling and Other (See Comments)    "ALL SEAFOOD"   Tomato Anaphylaxis, Hives and Swelling   Chicken Meat (Diagnostic) Other (See Comments)    Tested allergic to this years ago  Latex Itching    No powdered gloves!!   Pork-Derived Products Other (See Comments)    CAUSES SEVERE HEADACHES   Soy Allergy Other (See Comments)    Tested allergic to this years ago   Grass Extracts [Gramineae Pollens] Hives, Rash and Other (See Comments)    Wheezing also   Tree Extract Other (See Comments)    Patient tested allergic to this years ago       Objective:    BP 122/76   Pulse (!) 50   Wt 243 lb 12.8 oz (110.6 kg)   SpO2 98%   BMI 34.00 kg/m  Wt Readings from Last 3 Encounters:  07/22/22 243 lb 12.8 oz (110.6 kg)  12/06/21 235 lb 9.6 oz (106.9 kg)  05/22/21 252 lb (114.3 kg)    Physical Exam Vitals and nursing note reviewed.  Constitutional:      Appearance: He is well-developed.  HENT:     Head: Normocephalic and atraumatic.  Cardiovascular:     Rate and Rhythm: Regular rhythm. Bradycardia present.     Heart sounds: Murmur heard.     No friction rub. No gallop.  Pulmonary:     Effort: Pulmonary effort is normal. No tachypnea or respiratory distress.     Breath sounds: Normal breath sounds. No decreased breath sounds, wheezing, rhonchi or rales.  Chest:     Chest wall: No tenderness.  Abdominal:     General: Bowel sounds are normal.     Palpations: Abdomen is soft.  Musculoskeletal:        General: Normal range of motion.     Cervical back: Normal range of motion.   Skin:    General: Skin is warm and dry.  Neurological:     Mental Status: He is alert and oriented to person, place, and time.     Coordination: Coordination normal.  Psychiatric:        Behavior: Behavior normal. Behavior is cooperative.        Thought Content: Thought content normal.        Judgment: Judgment normal.          Patient has been counseled extensively about nutrition and exercise as well as the importance of adherence with medications and regular follow-up. The patient was given clear instructions to go to ER or return to medical center if symptoms don't improve, worsen or new problems develop. The patient verbalized understanding.   Follow-up: Return if symptoms worsen or fail to improve.   Gildardo Pounds, FNP-BC [facility] [place], [place]   07/22/2022, 3:03 PM

## 2022-07-23 LAB — URINE CYTOLOGY ANCILLARY ONLY
Bacterial Vaginitis-Urine: NEGATIVE
Candida Urine: NEGATIVE
Chlamydia: NEGATIVE
Comment: NEGATIVE
Comment: NEGATIVE
Comment: NORMAL
Neisseria Gonorrhea: NEGATIVE
Trichomonas: NEGATIVE

## 2022-07-23 LAB — THYROID PANEL WITH TSH
Free Thyroxine Index: 2.1 (ref 1.2–4.9)
T3 Uptake Ratio: 26 % (ref 24–39)
T4, Total: 7.9 ug/dL (ref 4.5–12.0)
TSH: 2.75 u[IU]/mL (ref 0.450–4.500)

## 2022-07-23 LAB — HIV ANTIBODY (ROUTINE TESTING W REFLEX): HIV Screen 4th Generation wRfx: NONREACTIVE

## 2022-07-28 ENCOUNTER — Other Ambulatory Visit: Payer: Self-pay

## 2022-08-15 NOTE — Progress Notes (Unsigned)
Cardiology Office Note:    Date:  08/20/2022   ID:  Adrian [name], DOB 02-29-1976, MRN [id number]  PCP:  Gildardo Pounds, NP   [facility] Providers Cardiologist:  Lenna Sciara, MD Referring MD: Gildardo Pounds, NP   Chief Complaint/Reason for Referral: Heart murmur and bradycardia  ASSESSMENT:    1. Precordial pain   2. Murmur, cardiac   3. Palpitations     PLAN:    In order of problems listed above: 1.  Chest pain:   2.  Murmur:  3.  Palpitations:         History of Present Illness:    FOCUSED PROBLEM LIST:   1.  Stage II chronic kidney disease 2.  Severe persistent asthma  The patient is a 47 y.o. male with the indicated medical history here for recommendations regarding incidental bradycardia seen on EKG as well as a heart murmur.  The patient was seen by his primary care provider recently.  He reported some chest discomfort that was intermittent and not always associated with activity.  He also reported palpitations.  On examination that day a murmur was also noted.  TSH was assessed and found to be within normal limits.          Current Medications: No outpatient medications have been marked as taking for the 08/20/22 encounter (Office Visit) with Early Osmond, MD.     Allergies:    Apple juice, Atrovent [ipratropium], Banana, Carrot oil, Fish allergy, Fruit & vegetable daily [nutritional supplements], Peanut-containing drug products, Shellfish allergy, Tomato, Chicken meat (diagnostic), Latex, Pork-derived products, Soy allergy, Grass extracts [gramineae pollens], and Tree extract   Social History:   Social History   Tobacco Use   Smoking status: Never   Smokeless tobacco: Never  Vaping Use   Vaping Use: Never used  Substance Use Topics   Alcohol use: Never   Drug use: Yes    Types: Marijuana     Family Hx: Family History  Problem Relation Age of Onset   Asthma Father    Diabetes Mother    Hypertension Mother    Clotting  disorder Other        Aunt   Breast cancer Other        Grandmother   Prostate cancer Other        uncle   Hyperlipidemia Other        grandparent   Hypertension Other        grandparent/grandparent   Kidney disease Other        aunt/uncle/grandparent   Stroke Other        grandparent     Review of Systems:   Please see the history of present illness.    All other systems reviewed and are negative.     EKGs/Labs/Other Test Reviewed:    EKG:  EKG performed January 2024 that I personally reviewed demonstrates sinus bradycardia;  Prior CV studies: None available  Other studies Reviewed: Review of the additional studies/records demonstrates: All imaging studies reviewed do not demonstrate aortic atherosclerosis or coronary artery calcification  Recent Labs: 12/06/2021: ALT 16; BUN 10; Creatinine, Ser 1.28; Hemoglobin 15.0; Platelets 313; Potassium 4.3; Sodium 140 07/22/2022: TSH 2.750   Recent Lipid Panel Lab Results  Component Value Date/Time   CHOL 125 03/20/2020 12:00 PM   TRIG 72 03/20/2020 12:00 PM   HDL 32 (L) 03/20/2020 12:00 PM   LDLCALC 78 03/20/2020 12:00 PM    Risk Assessment/Calculations:  Physical Exam:      Signed, Early Osmond, MD  08/20/2022 4:56 PM    [facility] [address], [place], [place]  [address] Phone: [phone]; Fax: [phone]   Note:  This document was prepared using Dragon voice recognition software and may include unintentional dictation errors.

## 2022-08-20 ENCOUNTER — Ambulatory Visit: Payer: Self-pay | Attending: Internal Medicine | Admitting: Internal Medicine

## 2022-08-20 DIAGNOSIS — R011 Cardiac murmur, unspecified: Secondary | ICD-10-CM

## 2022-08-20 DIAGNOSIS — R072 Precordial pain: Secondary | ICD-10-CM

## 2022-08-20 DIAGNOSIS — R002 Palpitations: Secondary | ICD-10-CM

## 2022-08-27 ENCOUNTER — Other Ambulatory Visit: Payer: Self-pay

## 2022-08-28 ENCOUNTER — Other Ambulatory Visit: Payer: Self-pay

## 2022-10-07 ENCOUNTER — Other Ambulatory Visit: Payer: Self-pay

## 2022-10-07 ENCOUNTER — Other Ambulatory Visit (HOSPITAL_BASED_OUTPATIENT_CLINIC_OR_DEPARTMENT_OTHER): Payer: Self-pay

## 2022-10-08 ENCOUNTER — Other Ambulatory Visit: Payer: Self-pay

## 2022-10-24 ENCOUNTER — Other Ambulatory Visit: Payer: Self-pay

## 2022-10-24 ENCOUNTER — Other Ambulatory Visit: Payer: Self-pay | Admitting: Nurse Practitioner

## 2022-10-24 DIAGNOSIS — J4552 Severe persistent asthma with status asthmaticus: Secondary | ICD-10-CM

## 2022-10-24 MED ORDER — ALBUTEROL SULFATE HFA 108 (90 BASE) MCG/ACT IN AERS
2.0000 | INHALATION_SPRAY | RESPIRATORY_TRACT | 0 refills | Status: DC | PRN
  Filled 2022-10-24: qty 18, 25d supply, fill #0
  Filled 2022-10-31: qty 6.7, 10d supply, fill #0

## 2022-10-24 NOTE — Telephone Encounter (Signed)
Requested Prescriptions  Pending Prescriptions Disp Refills   albuterol (VENTOLIN HFA) 108 (90 Base) MCG/ACT inhaler 18 g 0    Sig: Inhale 2 puffs into the lungs every 4 (four) hours as needed for wheezing or shortness of breath.     Pulmonology:  Beta Agonists 2 Passed - 10/24/2022 12:16 PM      Passed - Last BP in normal range    BP Readings from Last 1 Encounters:  07/22/22 122/76         Passed - Last Heart Rate in normal range    Pulse Readings from Last 1 Encounters:  07/22/22 (!) 50         Passed - Valid encounter within last 12 months    Recent Outpatient Visits           3 months ago High risk heterosexual behavior   [facility] [place], Shea Stakes, NP   10 months ago Eczema, dyshidrotic   [facility] [place], Shea Stakes, NP   1 year ago Hospital discharge follow-up   [facility]iborne Rigg, NP   2 years ago Nausea   [facility], Kasandra Knudsen, [place]   2 years ago Nausea   [facility], [place], [place]

## 2022-10-30 ENCOUNTER — Other Ambulatory Visit: Payer: Self-pay

## 2022-10-31 ENCOUNTER — Other Ambulatory Visit: Payer: Self-pay

## 2022-12-12 ENCOUNTER — Other Ambulatory Visit: Payer: Self-pay | Admitting: Nurse Practitioner

## 2022-12-12 ENCOUNTER — Other Ambulatory Visit (HOSPITAL_COMMUNITY): Payer: Self-pay

## 2022-12-12 DIAGNOSIS — J4552 Severe persistent asthma with status asthmaticus: Secondary | ICD-10-CM

## 2022-12-12 MED ORDER — ALBUTEROL SULFATE HFA 108 (90 BASE) MCG/ACT IN AERS
2.0000 | INHALATION_SPRAY | RESPIRATORY_TRACT | 0 refills | Status: DC | PRN
  Filled 2022-12-12: qty 6.7, 16d supply, fill #0

## 2022-12-22 ENCOUNTER — Other Ambulatory Visit (HOSPITAL_COMMUNITY): Payer: Self-pay

## 2023-01-20 ENCOUNTER — Other Ambulatory Visit: Payer: Self-pay

## 2023-02-05 ENCOUNTER — Other Ambulatory Visit (HOSPITAL_COMMUNITY)
Admission: RE | Admit: 2023-02-05 | Discharge: 2023-02-05 | Disposition: A | Discharge status: Home / Self Care | Payer: Self-pay | Source: Ambulatory Visit | Attending: Physician Assistant | Admitting: Physician Assistant

## 2023-02-05 ENCOUNTER — Other Ambulatory Visit: Payer: Self-pay

## 2023-02-05 ENCOUNTER — Encounter: Payer: Self-pay | Admitting: Physician Assistant

## 2023-02-05 ENCOUNTER — Ambulatory Visit: Payer: Self-pay | Attending: Physician Assistant | Admitting: Physician Assistant

## 2023-02-05 VITALS — BP 130/79 | HR 58 | Temp 98.6°F | Resp 14 | Ht 70.0 in | Wt 254.0 lb

## 2023-02-05 DIAGNOSIS — R110 Nausea: Secondary | ICD-10-CM

## 2023-02-05 DIAGNOSIS — R42 Dizziness and giddiness: Secondary | ICD-10-CM

## 2023-02-05 DIAGNOSIS — K219 Gastro-esophageal reflux disease without esophagitis: Secondary | ICD-10-CM

## 2023-02-05 DIAGNOSIS — F419 Anxiety disorder, unspecified: Secondary | ICD-10-CM

## 2023-02-05 DIAGNOSIS — M62838 Other muscle spasm: Secondary | ICD-10-CM

## 2023-02-05 DIAGNOSIS — L301 Dyshidrosis [pompholyx]: Secondary | ICD-10-CM

## 2023-02-05 DIAGNOSIS — F32A Depression, unspecified: Secondary | ICD-10-CM

## 2023-02-05 DIAGNOSIS — J4552 Severe persistent asthma with status asthmaticus: Secondary | ICD-10-CM

## 2023-02-05 DIAGNOSIS — Z7251 High risk heterosexual behavior: Secondary | ICD-10-CM

## 2023-02-05 DIAGNOSIS — M25511 Pain in right shoulder: Secondary | ICD-10-CM

## 2023-02-05 MED ORDER — DICLOFENAC SODIUM 75 MG PO TBEC
75.0000 mg | DELAYED_RELEASE_TABLET | Freq: Two times a day (BID) | ORAL | 1 refills | Status: AC
Start: 2023-02-05 — End: ?
  Filled 2023-02-05: qty 60, 30d supply, fill #0
  Filled 2023-06-29: qty 60, 30d supply, fill #1

## 2023-02-05 MED ORDER — PANTOPRAZOLE SODIUM 40 MG PO TBEC
40.0000 mg | DELAYED_RELEASE_TABLET | Freq: Every day | ORAL | 1 refills | Status: DC
  Filled 2023-02-05: qty 90, 90d supply, fill #0
  Filled 2023-06-29 – 2023-07-29 (×3): qty 90, 90d supply, fill #1

## 2023-02-05 MED ORDER — METHOCARBAMOL 500 MG PO TABS
1000.0000 mg | ORAL_TABLET | Freq: Three times a day (TID) | ORAL | 1 refills | Status: AC | PRN
  Filled 2023-02-05: qty 60, 10d supply, fill #0

## 2023-02-05 MED ORDER — FLUTICASONE-SALMETEROL 100-50 MCG/ACT IN AEPB
1.0000 | INHALATION_SPRAY | Freq: Two times a day (BID) | RESPIRATORY_TRACT | 2 refills | Status: DC
Start: 2023-02-05 — End: 2023-09-30
  Filled 2023-02-05 – 2023-06-29 (×2): qty 60, 30d supply, fill #0

## 2023-02-05 MED ORDER — ONDANSETRON 8 MG PO TBDP
8.0000 mg | ORAL_TABLET | Freq: Three times a day (TID) | ORAL | 3 refills | Status: DC | PRN
Start: 2023-02-05 — End: 2023-09-29
  Filled 2023-02-05: qty 30, 10d supply, fill #0
  Filled 2023-03-30: qty 30, 10d supply, fill #1
  Filled 2023-06-29 (×2): qty 30, 10d supply, fill #2
  Filled 2023-07-29 (×2): qty 30, 10d supply, fill #3

## 2023-02-05 MED ORDER — BETAMETHASONE VALERATE 0.1 % EX OINT
1.0000 | TOPICAL_OINTMENT | Freq: Two times a day (BID) | CUTANEOUS | 3 refills | Status: AC
  Filled 2023-02-05: qty 90, 45d supply, fill #0
  Filled 2023-03-30 – 2023-04-13 (×2): qty 90, 45d supply, fill #1
  Filled 2023-06-29: qty 90, 45d supply, fill #2
  Filled 2023-07-29: qty 90, 45d supply, fill #3
  Filled 2023-12-01: qty 30, 15d supply, fill #4

## 2023-02-05 MED ORDER — HYDROXYZINE HCL 25 MG PO TABS
25.0000 mg | ORAL_TABLET | Freq: Three times a day (TID) | ORAL | 6 refills | Status: DC | PRN
  Filled 2023-02-05: qty 60, 20d supply, fill #0
  Filled 2023-03-30 – 2023-04-13 (×2): qty 60, 20d supply, fill #1
  Filled 2023-06-29: qty 60, 20d supply, fill #2
  Filled 2023-07-29: qty 60, 20d supply, fill #3
  Filled 2023-09-29: qty 60, 20d supply, fill #4
  Filled 2023-12-01: qty 60, 20d supply, fill #5

## 2023-02-05 MED ORDER — ALBUTEROL SULFATE HFA 108 (90 BASE) MCG/ACT IN AERS
2.0000 | INHALATION_SPRAY | RESPIRATORY_TRACT | 1 refills | Status: DC | PRN
Start: 2023-02-05 — End: 2023-07-29
  Filled 2023-02-05: qty 6.7, 17d supply, fill #0
  Filled 2023-03-30: qty 6.7, 17d supply, fill #1

## 2023-02-05 MED ORDER — MECLIZINE HCL 25 MG PO TABS
25.0000 mg | ORAL_TABLET | Freq: Three times a day (TID) | ORAL | 2 refills | Status: DC
  Filled 2023-02-05: qty 30, 10d supply, fill #0
  Filled 2023-06-29: qty 30, 10d supply, fill #1

## 2023-02-05 MED ORDER — MONTELUKAST SODIUM 10 MG PO TABS
10.0000 mg | ORAL_TABLET | Freq: Every day | ORAL | 1 refills | Status: AC
  Filled 2023-02-05: qty 30, 30d supply, fill #0
  Filled 2023-06-29: qty 30, 30d supply, fill #1

## 2023-02-05 NOTE — Progress Notes (Signed)
Patient ID: Adrian [name], male   DOB: [birth year], 47 y.o.   MRN: [id number]   Adrian [name], is a 47 y.o. male  GMW:[id number]  YQI:[id number]  DOB - [birth year]  Chief Complaint  Patient presents with   Shoulder Pain       Subjective:   Adrian [name] is a 47 y.o. male here today for multiple concerns.  He needs refills on meds.  He is also continuing to have issues with eczema but is not following with treatment recommendations.  He has been using a pumice stone to scrub his hands.  He has also been sleeping in medical gloves.  He uses the ointment but not consistently.  He says he is "in court right now bc I can't work bc of my hands."    Also has R upper back and shoulder pain for about 1 month.  No recent injury.  Pain is decreasing his ROM.  No weakness.  No paresthesias.  Uses marijuana for pain relief sometimes  Also wants to have STD testing.  H/o trich.  No urinary pain or discharge.    Needs RF of inhalers.     No problems updated.  ALLERGIES: Allergies  Allergen Reactions   Apple Juice Shortness Of Breath, Itching, Nausea Only and Swelling    Tongue itches also   Atrovent [Ipratropium] Anaphylaxis    Atrovent contains peanut oil and PT has a HX of anaphlaxis to peanuts   Banana Shortness Of Breath and Itching    Makes tongue itch   Carrot Oil Shortness Of Breath and Itching   Fish Allergy Anaphylaxis, Hives and Swelling   Fruit & Vegetable Daily [Nutritional Supplements] Shortness Of Breath and Itching    Patient stated that he allergic to most all fruits   Peanut-Containing Drug Products Anaphylaxis, Hives, Nausea And Vomiting and Swelling   Shellfish Allergy Anaphylaxis, Hives, Swelling and Other (See Comments)    "ALL SEAFOOD"   Tomato Anaphylaxis, Hives and Swelling   Chicken Meat (Diagnostic) Other (See Comments)    Tested allergic to this years ago   Latex Itching    No powdered gloves!!   Pork-Derived Products Other (See Comments)    CAUSES SEVERE  HEADACHES   Soy Allergy Other (See Comments)    Tested allergic to this years ago   Grass Extracts [Gramineae Pollens] Hives, Rash and Other (See Comments)    Wheezing also   Tree Extract Other (See Comments)    Patient tested allergic to this years ago    PAST MEDICAL HISTORY: Past Medical History:  Diagnosis Date   Asthma    at birth   Eczema    Environmental allergies    grass   Foot fracture, left    Heart murmur    Migraine    Multiple food allergies    fish, tomatoes, peanuts, eggs, chicken and others    MEDICATIONS AT HOME: Prior to Admission medications   Medication Sig Start Date End Date Taking? Authorizing Provider  betamethasone valerate ointment (VALISONE) 0.1 % Apply 1 application  topically 2 (two) times daily. 12/06/21  Yes Claiborne Rigg, NP  methocarbamol (ROBAXIN) 500 MG tablet Take 2 tablets (1,000 mg total) by mouth every 8 (eight) hours as needed for muscle spasms. 02/05/23  Yes Anders Simmonds, PA-C  traZODone (DESYREL) 50 MG tablet Take 1 tablet (50 mg total) by mouth at bedtime as needed for sleep. 12/06/21  Yes Claiborne Rigg, NP  albuterol (VENTOLIN HFA) 108 (90 Base) MCG/ACT inhaler  Inhale 2 puffs into the lungs every 4 (four) hours as needed for wheezing or shortness of breath. 02/05/23   Anders Simmonds, PA-C  fluticasone-salmeterol (ADVAIR DISKUS) 100-50 MCG/ACT AEPB Inhale 1 puff into the lungs 2 (two) times daily. 02/05/23   Anders Simmonds, PA-C  hydrOXYzine (ATARAX) 25 MG tablet Take 1 tablet (25 mg total) by mouth every 8 (eight) hours as needed for anxiety. 02/05/23   Anders Simmonds, PA-C  meclizine (ANTIVERT) 25 MG tablet Take 1 tablet (25 mg total) by mouth 3 (three) times daily. For dizziness 02/05/23   Anders Simmonds, PA-C  montelukast (SINGULAIR) 10 MG tablet Take 1 tablet (10 mg total) by mouth at bedtime. For asthma 02/05/23   Anders Simmonds, PA-C  ondansetron (ZOFRAN-ODT) 8 MG disintegrating tablet TAKE 1 TABLET (8 MG TOTAL) BY MOUTH  EVERY 8 (EIGHT) HOURS AS NEEDED FOR NAUSEA OR VOMITING. 02/05/23   Anders Simmonds, PA-C  pantoprazole (PROTONIX) 40 MG tablet Take 1 tablet (40 mg total) by mouth daily. 02/05/23   , Marzella Schlein, PA-C    ROS: Neg HEENT Neg resp Neg cardiac Neg GI Neg psych Neg neuro  Objective:   Vitals:   02/05/23 1349  BP: 130/79  Pulse: (!) 58  Resp: 14  Temp: 98.6 F (37 C)  SpO2: 99%  Weight: 254 lb (115.2 kg)  Height: 5\' 10"  (1.778 m)   Exam General appearance : Awake, alert, not in any distress. Speech Clear. Not toxic looking HEENT: Atraumatic and Normocephalic Neck: Supple, no JVD. No cervical lymphadenopathy.  Chest: Good air entry bilaterally, CTAB.  No rales/rhonchi.  Some mild wheezing CVS: S1 S2 regular, no murmurs.  R vs L shoulder.  ROM ~90% of normal in all planes.  No joint/ligament instability and no point tenderness.  Neg empty can.  Neg speed's.  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  Some lichenification and cracking.  No secondary infection  Data Review Lab Results  Component Value Date   HGBA1C 5.5 03/20/2020    Assessment & Plan   1. Right shoulder pain, unspecified chronicity - Ambulatory referral to Orthopedic Surgery  2. Anxiety and depression stable - hydrOXYzine (ATARAX) 25 MG tablet; Take 1 tablet (25 mg total) by mouth every 8 (eight) hours as needed for anxiety.  Dispense: 60 tablet; Refill: 6  3. Nausea Ongoing and occasional - ondansetron (ZOFRAN-ODT) 8 MG disintegrating tablet; TAKE 1 TABLET (8 MG TOTAL) BY MOUTH EVERY 8 (EIGHT) HOURS AS NEEDED FOR NAUSEA OR VOMITING.  Dispense: 30 tablet; Refill: 3 - meclizine (ANTIVERT) 25 MG tablet; Take 1 tablet (25 mg total) by mouth 3 (three) times daily. For dizziness  Dispense: 30 tablet; Refill: 2 - Comprehensive  metabolic panel - Hemoglobin A1c  4. Gastroesophageal reflux disease without esophagitis - pantoprazole (PROTONIX) 40 MG tablet; Take 1 tablet (40 mg total) by mouth daily.  Dispense: 90 tablet; Refill: 1  5. Severe persistent asthma with status asthmaticus - fluticasone-salmeterol (ADVAIR DISKUS) 100-50 MCG/ACT AEPB; Inhale 1 puff into the lungs 2 (two) times daily.  Dispense: 60 each; Refill: 2 - montelukast (SINGULAIR) 10 MG tablet; Take 1 tablet (10 mg total) by mouth at bedtime. For asthma  Dispense: 90 tablet; Refill: 1 - albuterol (VENTOLIN HFA) 108 (90 Base) MCG/ACT inhaler; Inhale 2  puffs into the lungs every 4 (four) hours as needed for wheezing or shortness of breath.  Dispense: 18 g; Refill: 1  6. Dizziness Increase water intake.  This is not new - meclizine (ANTIVERT) 25 MG tablet; Take 1 tablet (25 mg total) by mouth 3 (three) times daily. For dizziness  Dispense: 30 tablet; Refill: 2 - Hemoglobin A1c  7. Eczema, dyshidrotic Stop sleeping with gloves on!!!  Do not use pumic stone on hands RF steroid ointment - Ambulatory referral to Dermatology  8. Muscle spasm - methocarbamol (ROBAXIN) 500 MG tablet; Take 2 tablets (1,000 mg total) by mouth every 8 (eight) hours as needed for muscle spasms.  Dispense: 60 tablet; Refill: 1 - Ambulatory referral to Orthopedic Surgery  9 persistent asthma  - fluticasone-salmeterol (ADVAIR DISKUS) 100-50 MCG/ACT AEPB; Inhale 1 puff into the lungs 2 (two) times daily.  Dispense: 60 each; Refill: 2 - montelukast (SINGULAIR) 10 MG tablet; Take 1 tablet (10 mg total) by mouth at bedtime. For asthma  Dispense: 90 tablet; Refill: 1 - albuterol (VENTOLIN HFA) 108 (90 Base) MCG/ACT inhaler; Inhale 2 puffs into the lungs every 4 (four) hours as needed for wheezing or shortness of breath.  Dispense: 18 g; Refill: 1  10. High risk heterosexual behavior Safe sex always!!!! - Urine cytology ancillary only    Return in about 4 months (around  06/07/2023).  The patient was given clear instructions to go to ER or return to medical center if symptoms don't improve, worsen or new problems develop. The patient verbalized understanding. The patient was told to call to get lab results if they haven't heard anything in the next week.      Georgian Co, PA-C [facility] [place], [place] [phone]   02/05/2023, 2:04 PM

## 2023-02-05 NOTE — Patient Instructions (Addendum)
Dizziness Dizziness is a common problem. It makes you feel unsteady or light-headed. You may feel like you are about to pass out (faint). Dizziness can lead to getting hurt if you stumble or fall. Dizziness can be caused by many things, including: Medicines. Not having enough water in your body (dehydration). Drink 80 ounces water daily Illness. Follow these instructions at home: Eating and drinking  Drink enough fluid to keep your pee (urine) pale yellow. This helps to keep you from getting dehydrated. Try to drink more clear fluids, such as water. Do not drink alcohol. Limit how much caffeine you drink or eat, if your doctor tells you to do that. Limit how much salt (sodium) you drink or eat, if your doctor tells you to do that. Activity  Avoid making quick movements. Stand up slowly from sitting in a chair, and steady yourself until you feel okay. In the morning, first sit up on the side of the bed. When you feel okay, stand up slowly while you hold onto something. Do this until you know that your balance is okay. If you need to stand in one place for a long time, move your legs often. Tighten and relax the muscles in your legs while you are standing. Do not drive or use machinery if you feel dizzy. Avoid bending down if you feel dizzy. Place items in your home so you can reach them easily without leaning over. Lifestyle Do not smoke or use any products that contain nicotine or tobacco. If you need help quitting, ask your doctor. Try to lower your stress level. You can do this by using methods such as yoga or meditation. Talk with your doctor if you need help. General instructions Watch your dizziness for any changes. Take over-the-counter and prescription medicines only as told by your doctor. Talk with your doctor if you think that you are dizzy because of a medicine that you are taking. Tell a friend or a family member that you are feeling dizzy. If he or she notices any changes in  your behavior, have this person call your doctor. Keep all follow-up visits. Contact a doctor if: Your dizziness does not go away. Your dizziness or light-headedness gets worse. You feel like you may vomit (are nauseous). You have trouble hearing. You have new symptoms. You are unsteady on your feet. You feel like the room is spinning. You have neck pain or a stiff neck. You have a fever. Get help right away if: You vomit or have watery poop (diarrhea), and you cannot eat or drink anything. You have trouble: Talking. Walking. Swallowing. Using your arms, hands, or legs. You feel generally weak. You are not thinking clearly, or you have trouble forming sentences. A friend or family member may notice this. You have: Chest pain. Pain in your belly (abdomen). Shortness of breath. Sweating. Your vision changes. You are bleeding. You have a very bad headache. These symptoms may be an emergency. Get help right away. Call your local emergency services (911 in the [place] Do not wait to see if the symptoms will go away. Do not drive yourself to the hospital. Summary Dizziness makes you feel unsteady or light-headed. You may feel like you are about to pass out (faint). Drink enough fluid to keep your pee (urine) pale yellow. Do not drink alcohol. Avoid making quick movements if you feel dizzy. Watch your dizziness for any changes. This information is not intended to replace advice given to you by your health care provider. Make  sure you discuss any questions you have with your health care provider. Document Revised: 05/18/2020 Document Reviewed: 05/21/2020 Elsevier Patient Education  2024 [employer] Dyshidrotic Eczema Dyshidrotic eczema, also known as pompholyx, is a type of eczema that causes very itchy, fluid-filled blisters (vesicles) to form on the hands and feet. It is more common before age 59, though it can affect people of any age. There is no cure, but treatment and certain  lifestyle changes can help relieve symptoms. What are the causes? The cause of this condition is not known. What increases the risk? You are more likely to develop this condition if: You wash your hands frequently. You have a personal or family history of eczema, allergies, asthma, or hay fever. You are allergic to metals, such as nickel or cobalt. You work with cement. You smoke. What are the signs or symptoms? Symptoms of this condition may affect the hands, the feet, or both. Symptoms may come and go (recur), and may include: Severe itching. This may happen before blisters appear. Blisters. These may form suddenly. In the early stages, blisters may form near the fingertips. In severe cases, blisters may grow to large blister masses (bullae). Blisters resolve in 2-3 weeks without bursting. This is followed by a dry phase in which itching eases. Pain and swelling. Cracks or long, narrow openings (fissures) in the skin. Severe dryness. Ridges on the nails. How is this diagnosed? This condition may be diagnosed based on: Your symptoms and a physical exam. Your medical history. Skin scrapings to rule out a fungal infection. Testing a swab of fluid for bacteria (culture). Removing a small piece of skin (biopsy) to test for infection or to rule out other conditions. Skin patch tests. These tests involve using patches that contain possible allergens and placing them on your back. Your health care provider will wait a few days and then check to see if an allergic reaction occurred. These tests may be done if your health care provider suspects allergic reactions, or to rule out other types of eczema. You may be referred to a health care provider who specializes in skin conditions (dermatologist) to help diagnose and treat this condition. How is this treated? There is no cure for this condition, but treatment can help relieve symptoms. Depending on the amount and severity of the blisters, your  health care provider may suggest: Avoiding allergens, irritants, or triggers that worsen symptoms. This may involve lifestyle changes, such as: Using different lotions or soaps. Avoiding hot weather or places that will cause you to sweat a lot. Managing stress with coping techniques, such as relaxation and exercise, and asking for help when you need it. Diet changes as recommended by your health care provider. Using a clean, damp towel (cool compress) to relieve symptoms. Soaking in a bath that contains a type of salt that relieves irritation (aluminum acetate soaks). Medicines, such as: Medicine taken by mouth to reduce itching (oral antihistamines). Medicine applied to the skin to reduce swelling and irritation (topical corticosteroids). Medicine that reduces the activity of the body's disease-fighting system (immunosuppressants) to treat inflammation. This may be given in severe cases. Antibiotic medicines to treat bacterial infection. Light therapy (phototherapy). This involves shining ultraviolet (UV) light on the affected skin in order to reduce itchiness and inflammation. Follow these instructions at home: Bathing and skin care  Wash skin gently. After bathing or washing your hands, pat your skin dry. Avoid rubbing your skin. Remove all jewelry before bathing. If the skin under the jewelry  stays wet, blisters may form or get worse. Apply cool compresses as told by your health care provider. To do this: Soak a clean towel in cool water. Wring out excess water until towel is damp. Place the towel over the affected skin. Leave the towel on for 20 minutes at a time, 2-3 times a day. Use mild soaps, cleansers, and lotions that do not contain dyes, perfumes, or other irritants. Keep your skin hydrated. To do this: Avoid very hot water. Take lukewarm baths or showers. Apply moisturizer within 3 minutes of bathing. This locks in moisture. Medicines Take and apply over-the-counter and  prescription medicines only as told by your health care provider. If you were prescribed an antibiotic medicine, take or apply it as told by your health care provider. Do not stop using the antibiotic even if you start to feel better. General instructions Do not use any products that contain nicotine or tobacco. These include cigarettes, chewing tobacco, and vaping devices, such as e-cigarettes. If you need help quitting, ask your health care provider. Identify and avoid triggers and allergens. Keep fingernails short to avoid breaking the skin while scratching. Use waterproof gloves to protect your hands when doing work that keeps your hands wet for a long time. Wear socks to keep your feet dry. Keep all follow-up visits. This is important. Contact a health care provider if: You have symptoms that do not go away. You have signs of infection, such as: Crusting, pus, or a bad smell. More redness, swelling, or pain. Increased warmth in the affected area. Get help right away if: Your skin gets streaking redness with associated pain. Summary Dyshidrotic eczema, also known as pompholyx, is a type of eczema that causes very itchy, fluid-filled blisters (vesicles) to form on the hands and feet. The cause of this condition is not known. There is no cure for this condition, but treatment can help relieve symptoms. Treatment depends on the amount and severity of the blisters. Use mild soaps, cleansers, and lotions that do not contain dyes, perfumes, or other irritants. Keep your skin hydrated. This information is not intended to replace advice given to you by your health care provider. Make sure you discuss any questions you have with your health care provider. Document Revised: 03/26/2020 Document Reviewed: 03/26/2020 Elsevier Patient Education  2024 [employer].

## 2023-02-06 ENCOUNTER — Other Ambulatory Visit: Payer: Self-pay

## 2023-02-10 ENCOUNTER — Telehealth: Payer: Self-pay

## 2023-02-10 NOTE — Telephone Encounter (Signed)
Pt was called and is aware of results, DOB was confirmed.  ?

## 2023-02-10 NOTE — Telephone Encounter (Signed)
-----   Message from Georgian Co sent at 02/10/2023 10:29 AM EDT ----- Your STD testing for chlamydia, gonorrhea and trichomonas were negative.  Thanks, Georgian Co, PA-C

## 2023-02-11 ENCOUNTER — Ambulatory Visit: Payer: Self-pay | Admitting: Physician Assistant

## 2023-02-24 ENCOUNTER — Ambulatory Visit: Payer: Self-pay | Admitting: Orthopaedic Surgery

## 2023-03-30 ENCOUNTER — Other Ambulatory Visit: Payer: Self-pay

## 2023-03-31 ENCOUNTER — Other Ambulatory Visit: Payer: Self-pay

## 2023-04-01 ENCOUNTER — Other Ambulatory Visit: Payer: Self-pay

## 2023-04-08 ENCOUNTER — Other Ambulatory Visit: Payer: Self-pay

## 2023-04-13 ENCOUNTER — Other Ambulatory Visit: Payer: Self-pay

## 2023-04-14 ENCOUNTER — Other Ambulatory Visit: Payer: Self-pay

## 2023-06-29 ENCOUNTER — Other Ambulatory Visit: Payer: Self-pay

## 2023-06-30 ENCOUNTER — Other Ambulatory Visit: Payer: Self-pay

## 2023-07-29 ENCOUNTER — Other Ambulatory Visit: Payer: Self-pay | Admitting: Physician Assistant

## 2023-07-29 ENCOUNTER — Other Ambulatory Visit: Payer: Self-pay

## 2023-07-29 DIAGNOSIS — J4552 Severe persistent asthma with status asthmaticus: Secondary | ICD-10-CM

## 2023-07-29 MED ORDER — ALBUTEROL SULFATE HFA 108 (90 BASE) MCG/ACT IN AERS
2.0000 | INHALATION_SPRAY | RESPIRATORY_TRACT | 1 refills | Status: DC | PRN
  Filled 2023-07-29: qty 6.7, 17d supply, fill #0
  Filled 2023-08-26: qty 6.7, 17d supply, fill #1

## 2023-07-29 NOTE — Telephone Encounter (Signed)
Requested Prescriptions  Pending Prescriptions Disp Refills   albuterol (VENTOLIN HFA) 108 (90 Base) MCG/ACT inhaler 6.7 g 1    Sig: Inhale 2 puffs into the lungs every 4 (four) hours as needed for wheezing or shortness of breath.     Pulmonology:  Beta Agonists 2 Passed - 07/29/2023  4:04 PM      Passed - Last BP in normal range    BP Readings from Last 1 Encounters:  02/05/23 130/79         Passed - Last Heart Rate in normal range    Pulse Readings from Last 1 Encounters:  02/05/23 (!) 58         Passed - Valid encounter within last 12 months    Recent Outpatient Visits           5 months ago Eczema, dyshidrotic   [facility] [place]. [facility], Marzella Schlein, [place]   1 year ago High risk heterosexual behavior   [facility] [place]. [facility] Claiborne Rigg, NP   1 year ago Eczema, dyshidrotic   [facility] [place]. [facility] Claiborne Rigg, NP   2 years ago Hospital discharge follow-up   [facility] [place] [facility]. [facility] Claiborne Rigg, NP   2 years ago Nausea   [facility], [place], [place]

## 2023-07-30 ENCOUNTER — Other Ambulatory Visit: Payer: Self-pay

## 2023-08-04 ENCOUNTER — Other Ambulatory Visit: Payer: Self-pay

## 2023-08-26 ENCOUNTER — Other Ambulatory Visit: Payer: Self-pay

## 2023-08-27 ENCOUNTER — Other Ambulatory Visit: Payer: Self-pay

## 2023-09-03 ENCOUNTER — Other Ambulatory Visit: Payer: Self-pay

## 2023-09-03 ENCOUNTER — Emergency Department (HOSPITAL_COMMUNITY): Payer: Self-pay

## 2023-09-03 ENCOUNTER — Emergency Department (HOSPITAL_COMMUNITY)
Admission: EM | Admit: 2023-09-03 | Discharge: 2023-09-04 | Disposition: A | Discharge status: Home / Self Care | Payer: Self-pay | Attending: Emergency Medicine | Admitting: Emergency Medicine

## 2023-09-03 ENCOUNTER — Encounter (HOSPITAL_COMMUNITY): Payer: Self-pay | Admitting: Emergency Medicine

## 2023-09-03 DIAGNOSIS — J4531 Mild persistent asthma with (acute) exacerbation: Secondary | ICD-10-CM | POA: Insufficient documentation

## 2023-09-03 DIAGNOSIS — Z7951 Long term (current) use of inhaled steroids: Secondary | ICD-10-CM | POA: Insufficient documentation

## 2023-09-03 DIAGNOSIS — Z91040 Latex allergy status: Secondary | ICD-10-CM | POA: Insufficient documentation

## 2023-09-03 DIAGNOSIS — Z91010 Allergy to peanuts: Secondary | ICD-10-CM | POA: Insufficient documentation

## 2023-09-03 MED ORDER — ALBUTEROL SULFATE HFA 108 (90 BASE) MCG/ACT IN AERS
2.0000 | INHALATION_SPRAY | RESPIRATORY_TRACT | Status: DC | PRN
  Administered 2023-09-04: 2 via RESPIRATORY_TRACT
  Filled 2023-09-03: qty 6.7

## 2023-09-03 NOTE — ED Triage Notes (Signed)
 Patient complaining of shortness of breath x1 day. Hx of asthma - no relief with inhalers. Also reports generalized chest pain.

## 2023-09-03 NOTE — ED Notes (Signed)
 Blue, gold, light green, and purple top sent to lab.

## 2023-09-04 ENCOUNTER — Other Ambulatory Visit: Payer: Self-pay

## 2023-09-04 MED ORDER — LORATADINE 10 MG PO TABS
10.0000 mg | ORAL_TABLET | Freq: Once | ORAL | Status: AC
  Administered 2023-09-04: 10 mg via ORAL
  Filled 2023-09-04: qty 1

## 2023-09-04 MED ORDER — PREDNISONE 10 MG PO TABS
40.0000 mg | ORAL_TABLET | Freq: Every day | ORAL | 0 refills | Status: AC
  Filled 2023-09-04: qty 16, 4d supply, fill #0

## 2023-09-04 MED ORDER — ALBUTEROL SULFATE (2.5 MG/3ML) 0.083% IN NEBU
5.0000 mg | INHALATION_SOLUTION | Freq: Once | RESPIRATORY_TRACT | Status: AC
  Administered 2023-09-04: 5 mg via RESPIRATORY_TRACT
  Filled 2023-09-04: qty 6

## 2023-09-04 MED ORDER — ALBUTEROL SULFATE (2.5 MG/3ML) 0.083% IN NEBU
2.5000 mg | INHALATION_SOLUTION | Freq: Four times a day (QID) | RESPIRATORY_TRACT | 0 refills | Status: DC | PRN
  Filled 2023-09-04: qty 75, 7d supply, fill #0

## 2023-09-04 MED ORDER — PREDNISONE 20 MG PO TABS
40.0000 mg | ORAL_TABLET | Freq: Once | ORAL | Status: AC
  Administered 2023-09-04: 40 mg via ORAL
  Filled 2023-09-04: qty 2

## 2023-09-04 MED ORDER — CETIRIZINE HCL 10 MG PO TABS
10.0000 mg | ORAL_TABLET | Freq: Every day | ORAL | 0 refills | Status: DC
  Filled 2023-09-04: qty 14, 14d supply, fill #0

## 2023-09-04 NOTE — ED Provider Notes (Signed)
 [place] EMERGENCY DEPARTMENT AT [facility] Provider Note   CSN: [id number] Arrival date & time: 09/03/23  2306     History  Chief Complaint  Patient presents with   Shortness of Breath    Adrian [name] is a 48 y.o. male.  The history is provided by the patient.  Shortness of Breath Adrian [name] is a 48 y.o. male who presents to the Emergency Department complaining of difficulty breathing.  He presents to the emergency department for evaluation of symptoms that started earlier today when he was cleaning out a shed.  He feels like he was exposed to a lot of dust.  He reports associated cough.  He has chest tightness.  No fever, abdominal pain, leg swelling or pain.  He was feeling well prior to cleaning.  He did use his home albuterol inhaler, which did not seem to improve his symptoms.  He does have a history of asthma as well as anxiety.     Home Medications Prior to Admission medications   Medication Sig Start Date End Date Taking? Authorizing Provider  albuterol (PROVENTIL) (2.5 MG/3ML) 0.083% nebulizer solution Take 3 mLs (2.5 mg total) by nebulization every 6 (six) hours as needed for wheezing or shortness of breath. 09/04/23  Yes Tilden Fossa, MD  cetirizine (ZYRTEC) 10 MG tablet Take 1 tablet (10 mg total) by mouth daily. 09/04/23  Yes Tilden Fossa, MD  predniSONE (DELTASONE) 10 MG tablet Take 4 tablets (40 mg total) by mouth daily. 09/04/23  Yes Tilden Fossa, MD  albuterol (VENTOLIN HFA) 108 (90 Base) MCG/ACT inhaler Inhale 2 puffs into the lungs every 4 (four) hours as needed for wheezing or shortness of breath. 07/29/23   Claiborne Rigg, NP  betamethasone valerate ointment (VALISONE) 0.1 % Apply 1 application  topically 2 (two) times daily. 02/05/23   Anders Simmonds, PA-C  diclofenac (VOLTAREN) 75 MG EC tablet Take 1 tablet (75 mg total) by mouth 2 (two) times daily. 02/05/23   Anders Simmonds, PA-C  fluticasone-salmeterol (ADVAIR DISKUS) 100-50 MCG/ACT  AEPB Inhale 1 puff into the lungs 2 (two) times daily. 02/05/23   Anders Simmonds, PA-C  hydrOXYzine (ATARAX) 25 MG tablet Take 1 tablet (25 mg total) by mouth every 8 (eight) hours as needed for anxiety. 02/05/23   Anders Simmonds, PA-C  meclizine (ANTIVERT) 25 MG tablet Take 1 tablet (25 mg total) by mouth 3 (three) times daily. For dizziness 02/05/23   Anders Simmonds, PA-C  methocarbamol (ROBAXIN) 500 MG tablet Take 2 tablets (1,000 mg total) by mouth every 8 (eight) hours as needed for muscle spasms. 02/05/23   Anders Simmonds, PA-C  montelukast (SINGULAIR) 10 MG tablet Take 1 tablet (10 mg total) by mouth at bedtime. For asthma 02/05/23   Anders Simmonds, PA-C  ondansetron (ZOFRAN-ODT) 8 MG disintegrating tablet TAKE 1 TABLET (8 MG TOTAL) BY MOUTH EVERY 8 (EIGHT) HOURS AS NEEDED FOR NAUSEA OR VOMITING. 02/05/23   Anders Simmonds, PA-C  pantoprazole (PROTONIX) 40 MG tablet Take 1 tablet (40 mg total) by mouth daily. 02/05/23   Anders Simmonds, PA-C  traZODone (DESYREL) 50 MG tablet Take 1 tablet (50 mg total) by mouth at bedtime as needed for sleep. 12/06/21   Claiborne Rigg, NP      Allergies    Apple juice, Atrovent [ipratropium], Banana, Carrot oil, Fish allergy, Fruit & vegetable daily [nutritional supplements], Peanut-containing drug products, Shellfish allergy, Tomato, Chicken meat (diagnostic), Latex, Pork-derived products, Soy  allergy (obsolete), Grass extracts [gramineae pollens], and Tree extract    Review of Systems   Review of Systems  Respiratory:  Positive for shortness of breath.   All other systems reviewed and are negative.   Physical Exam Updated Vital Signs BP 139/89 (BP Location: Right Arm)   Pulse 73   Temp (!) 97.4 F (36.3 C) (Oral)   Resp 15   Ht 5\' 11"  (1.803 m)   Wt 115.2 kg   SpO2 98%   BMI 35.43 kg/m  Physical Exam Vitals and nursing note reviewed.  Constitutional:      Appearance: He is well-developed.  HENT:     Head: Normocephalic and  atraumatic.  Cardiovascular:     Rate and Rhythm: Normal rate and regular rhythm.     Heart sounds: No murmur heard. Pulmonary:     Effort: Pulmonary effort is normal. No respiratory distress.     Comments: Expiratory wheezes bilaterally without respiratory distress Abdominal:     Palpations: Abdomen is soft.     Tenderness: There is no abdominal tenderness. There is no guarding or rebound.  Musculoskeletal:        General: No swelling or tenderness.  Skin:    General: Skin is warm and dry.  Neurological:     Mental Status: He is alert and oriented to person, place, and time.  Psychiatric:        Behavior: Behavior normal.     ED Results / Procedures / Treatments   Labs (all labs ordered are listed, but only abnormal results are displayed) Labs Reviewed - No data to display  EKG EKG Interpretation Date/Time:  Thursday September 03 2023 23:17:47 EST Ventricular Rate:  97 PR Interval:  160 QRS Duration:  84 QT Interval:  358 QTC Calculation: 454 R Axis:   81  Text Interpretation: Normal sinus rhythm Right atrial enlargement Nonspecific ST abnormality Abnormal ECG Confirmed by Tilden Fossa [phone]) on 09/04/2023 12:06:38 AM  Radiology DG Chest 2 View Result Date: 09/04/2023 CLINICAL DATA:  Shortness of breath EXAM: CHEST - 2 VIEW COMPARISON:  05/09/2021 FINDINGS: Cardiac shadow is within normal limits. The lungs are well aerated bilaterally. No focal infiltrate or effusion is noted. No bony abnormality is seen. IMPRESSION: No active cardiopulmonary disease. Electronically Signed   By: Alcide Clever M.D.   On: 09/04/2023 01:35    Procedures Procedures    Medications Ordered in ED Medications  albuterol (VENTOLIN HFA) 108 (90 Base) MCG/ACT inhaler 2 puff (2 puffs Inhalation Given 09/04/23 0240)  predniSONE (DELTASONE) tablet 40 mg (40 mg Oral Given 09/04/23 0054)  albuterol (PROVENTIL) (2.5 MG/3ML) 0.083% nebulizer solution 5 mg (5 mg Nebulization Given 09/04/23 0055)  loratadine  (CLARITIN) tablet 10 mg (10 mg Oral Given 09/04/23 0054)    ED Course/ Medical Decision Making/ A&P                                 Medical Decision Making Amount and/or Complexity of Data Reviewed Radiology: ordered.  Risk OTC drugs. Prescription drug management.   Patient with history of asthma here for evaluation of shortness of breath that started in setting of dust exposure.  He has wheezing on initial evaluation, resolved after treatment with albuterol.  On reassessment he is feeling improved without hypoxia, has normal work of breathing.  Chest x-ray is negative for acute abnormality, images personally reviewed and interpreted, agree with radiologist interpretation.  Current clinical picture  is not consistent with ACS, PE, pneumonia, status asthmaticus.  Will treat with prednisone burst.  Will prescribe albuterol as needed.  Discussed outpatient follow-up and return precautions.        Final Clinical Impression(s) / ED Diagnoses Final diagnoses:  Mild persistent asthma with exacerbation    Rx / DC Orders ED Discharge Orders          Ordered    predniSONE (DELTASONE) 10 MG tablet  Daily        09/04/23 0211    albuterol (PROVENTIL) (2.5 MG/3ML) 0.083% nebulizer solution  Every 6 hours PRN        09/04/23 0211    cetirizine (ZYRTEC) 10 MG tablet  Daily        09/04/23 0211              Tilden Fossa, MD 09/04/23 [phone]

## 2023-09-29 ENCOUNTER — Other Ambulatory Visit: Payer: Self-pay | Admitting: Nurse Practitioner

## 2023-09-29 ENCOUNTER — Other Ambulatory Visit: Payer: Self-pay

## 2023-09-29 ENCOUNTER — Other Ambulatory Visit: Payer: Self-pay | Admitting: Physician Assistant

## 2023-09-29 DIAGNOSIS — R110 Nausea: Secondary | ICD-10-CM

## 2023-09-29 DIAGNOSIS — J4552 Severe persistent asthma with status asthmaticus: Secondary | ICD-10-CM

## 2023-09-29 MED ORDER — ONDANSETRON 8 MG PO TBDP
8.0000 mg | ORAL_TABLET | Freq: Three times a day (TID) | ORAL | 0 refills | Status: DC | PRN
  Filled 2023-09-29: qty 30, 10d supply, fill #0

## 2023-09-30 ENCOUNTER — Other Ambulatory Visit: Payer: Self-pay

## 2023-09-30 ENCOUNTER — Other Ambulatory Visit: Payer: Self-pay | Admitting: Nurse Practitioner

## 2023-09-30 DIAGNOSIS — J4552 Severe persistent asthma with status asthmaticus: Secondary | ICD-10-CM

## 2023-09-30 MED ORDER — FLUTICASONE-SALMETEROL 100-50 MCG/ACT IN AEPB
1.0000 | INHALATION_SPRAY | Freq: Two times a day (BID) | RESPIRATORY_TRACT | 2 refills | Status: AC
  Filled 2023-09-30 – 2023-12-01 (×2): qty 60, 30d supply, fill #0

## 2023-09-30 MED ORDER — ALBUTEROL SULFATE HFA 108 (90 BASE) MCG/ACT IN AERS
2.0000 | INHALATION_SPRAY | RESPIRATORY_TRACT | 1 refills | Status: DC | PRN
  Filled 2023-09-30: qty 6.7, 17d supply, fill #0
  Filled 2023-12-01: qty 6.7, 17d supply, fill #1

## 2023-09-30 MED ORDER — ALBUTEROL SULFATE (2.5 MG/3ML) 0.083% IN NEBU
2.5000 mg | INHALATION_SOLUTION | Freq: Four times a day (QID) | RESPIRATORY_TRACT | 0 refills | Status: AC | PRN
  Filled 2023-09-30: qty 75, 7d supply, fill #0

## 2023-09-30 MED ORDER — FLUTICASONE FUROATE-VILANTEROL 200-25 MCG/ACT IN AEPB
1.0000 | INHALATION_SPRAY | Freq: Every day | RESPIRATORY_TRACT | 1 refills | Status: AC
Start: 2023-09-30 — End: ?
  Filled 2023-09-30 – 2023-10-05 (×2): qty 60, 60d supply, fill #0
  Filled 2023-12-01 – 2024-01-20 (×3): qty 60, 30d supply, fill #1

## 2023-10-01 ENCOUNTER — Other Ambulatory Visit: Payer: Self-pay

## 2023-10-05 ENCOUNTER — Other Ambulatory Visit: Payer: Self-pay

## 2023-11-13 ENCOUNTER — Other Ambulatory Visit (HOSPITAL_COMMUNITY): Payer: Self-pay

## 2023-11-13 MED ORDER — IBUPROFEN 800 MG PO TABS
800.0000 mg | ORAL_TABLET | Freq: Three times a day (TID) | ORAL | 0 refills | Status: AC
  Filled 2023-11-13 – 2023-11-16 (×2): qty 21, 7d supply, fill #0

## 2023-11-13 MED ORDER — AMOXICILLIN 500 MG PO CAPS
500.0000 mg | ORAL_CAPSULE | Freq: Four times a day (QID) | ORAL | 0 refills | Status: AC
  Filled 2023-11-13 – 2023-11-16 (×2): qty 28, 7d supply, fill #0

## 2023-11-16 ENCOUNTER — Other Ambulatory Visit: Payer: Self-pay

## 2023-11-16 ENCOUNTER — Other Ambulatory Visit (HOSPITAL_COMMUNITY): Payer: Self-pay

## 2023-12-01 ENCOUNTER — Other Ambulatory Visit: Payer: Self-pay | Admitting: Family Medicine

## 2023-12-01 ENCOUNTER — Other Ambulatory Visit: Payer: Self-pay

## 2023-12-01 DIAGNOSIS — R110 Nausea: Secondary | ICD-10-CM

## 2023-12-02 ENCOUNTER — Other Ambulatory Visit: Payer: Self-pay

## 2023-12-02 ENCOUNTER — Telehealth: Payer: Self-pay

## 2023-12-02 NOTE — Telephone Encounter (Signed)
 BREO PATIENT ASSISTANCE ENROLLMENT WITH [employer] PAP:  10/05/2023: PT WAS GIVEN A FREE INHALER AT [facility] AND I SPOKE TO PATIENT IN-PERSON AND OFFERED TO COMPLETE [employer] APPLICATION AT REGISTER FOR PAP ENROLLMENT/FREE REFILLS. PATIENT REFUSED AND TOOK APPLICATION WITH HIM TO BRING BACK AT A LATER DATE. PT WAS ADVISED THAT WITHOUT PAP ENROLLMENT REFILLS WOULD NOT CONTINUE TO BE FREE.  12/02/2023: PT RETURNED TO [facility] TO REFILL BREO WHICH HAS A $35 COPAY (WITH MANUFACTURER'S COUPON) PT, FOR THE 2ND TIME, REFUSED TO COMPLETE [employer] APPLICATION AND TOOK THE APPLICATION WITH HIM, LEFT THE BREO INHALER AT THE PHARMACY-DID NOT WANT TO PAY THE COPAY. FREE BREO IS NOT AVAILABLE TO PATIENT WITHOUT [employer] PAP ENROLLMENT.

## 2023-12-03 ENCOUNTER — Other Ambulatory Visit: Payer: Self-pay

## 2023-12-07 ENCOUNTER — Ambulatory Visit: Payer: Self-pay | Admitting: Physician Assistant

## 2023-12-07 ENCOUNTER — Encounter: Payer: Self-pay | Admitting: Physician Assistant

## 2023-12-07 VITALS — BP 148/100

## 2023-12-07 DIAGNOSIS — D2262 Melanocytic nevi of left upper limb, including shoulder: Secondary | ICD-10-CM

## 2023-12-07 DIAGNOSIS — L209 Atopic dermatitis, unspecified: Secondary | ICD-10-CM

## 2023-12-07 MED ORDER — TRIAMCINOLONE ACETONIDE 0.1 % EX CREA: 1.0000 | TOPICAL_CREAM | Freq: Two times a day (BID) | CUTANEOUS | 2 refills | Status: AC | PRN

## 2023-12-07 MED ORDER — CLOBETASOL PROPIONATE 0.05 % EX OINT: 1.0000 | TOPICAL_OINTMENT | Freq: Two times a day (BID) | CUTANEOUS | 2 refills | Status: DC

## 2023-12-07 MED ORDER — HYDROCORTISONE 2.5 % EX OINT: TOPICAL_OINTMENT | Freq: Two times a day (BID) | CUTANEOUS | 2 refills | Status: DC

## 2023-12-07 NOTE — Patient Instructions (Addendum)
 Address: [facility]  [address], [place], [place] [address] Phone: [phone]  Due to recent changes in healthcare laws, you may see results of your pathology and/or laboratory studies on MyChart before the doctors have had a chance to review them. We understand that in some cases there may be results that are confusing or concerning to you. Please understand that not all results are received at the same time and often the doctors may need to interpret multiple results in order to provide you with the best plan of care or course of treatment. Therefore, we ask that you please give us  2 business days to thoroughly review all your results before contacting the office for clarification. Should we see a critical lab result, you will be contacted sooner.   If You Need Anything After Your Visit  If you have any questions or concerns for your doctor, please call our main line at [phone] and press option 4 to reach your doctor's medical assistant. If no one answers, please leave a voicemail as directed and we will return your call as soon as possible. Messages left after 4 pm will be answered the following business day.   You may also send us  a message via MyChart. We typically respond to MyChart messages within 1-2 business days.  For prescription refills, please ask your pharmacy to contact our office. Our fax number is [phone].  If you have an urgent issue when the clinic is closed that cannot wait until the next business day, you can page your doctor at the number below.    Please note that while we do our best to be available for urgent issues outside of office hours, we are not available 24/7.   If you have an urgent issue and are unable to reach us , you may choose to seek medical care at your doctor's office, retail clinic, urgent care center, or emergency room.  If you have a medical emergency, please immediately call 911 or go to the emergency department.   Dermatology  Medication Tips: Please keep the boxes that topical medications come in in order to help keep track of the instructions about where and how to use these. Pharmacies typically print the medication instructions only on the boxes and not directly on the medication tubes.   If your medication is too expensive, please contact our office at [phone] option 4 or send us  a message through MyChart.   We are unable to tell what your co-pay for medications will be in advance as this is different depending on your insurance coverage. However, we may be able to find a substitute medication at lower cost or fill out paperwork to get insurance to cover a needed medication.   If a prior authorization is required to get your medication covered by your insurance company, please allow us  1-2 business days to complete this process.  Drug prices often vary depending on where the prescription is filled and some pharmacies may offer cheaper prices.  The website www.goodrx.com contains coupons for medications through different pharmacies. The prices here do not account for what the cost may be with help from insurance (it may be cheaper with your insurance), but the website can give you the price if you did not use any insurance.  - You can print the associated coupon and take it with your prescription to the pharmacy.  - You may also stop by our office during regular business hours and pick up a GoodRx coupon card.  - If you  need your prescription sent electronically to a different pharmacy, notify our office through [facility] or by phone at [phone] option 4.

## 2023-12-07 NOTE — Progress Notes (Signed)
   New Patient Visit   Subjective  Adrian [name] is a 48 y.o. male who presents for the following: Atopic Dermatitis. Patient has had since he was a child. Also has history of asthma.   Patient is self pay and without insurance at this time.   Pt was previously seen by a dermatologist many years ago and prescribed Triamcinolone  which helped. PCP gave betamethasone  which he states is not beneficial. He is using [employer].  The following portions of the chart were reviewed this encounter and updated as appropriate: medications, allergies, medical history  Review of Systems:  No other skin or systemic complaints except as noted in HPI or Assessment and Plan.  Objective  Well appearing patient in no apparent distress; mood and affect are within normal limits.  Areas Examined: Full skin exam performed.   Relevant physical exam findings are noted in the Assessment and Plan.    Assessment & Plan   ATOPIC DERMATITIS, UNSPECIFIED TYPE   [name]     ATOPIC DERMATITIS Exam: hyperpigmented patches,  plaques and fissures with associated post inflammatory hyperpigmentation.   Inadequately controlled.   Atopic dermatitis (eczema) is a chronic, relapsing, pruritic condition that can significantly affect quality of life. It is often associated with allergic rhinitis and/or asthma and can require treatment with topical medications, phototherapy, or in severe cases biologic injectable medication (Dupixent; Adbry) or Oral JAK inhibitors.  Treatment Plan: -Triamcinolone  ointment - Clobetasol ointment  - Hydrocortisone 2.5 % ointment - Lengthy discussion regarding Dupixent since he has tried and failed topical steroids. Risks and benefits discussed. Plan would be to get started on this once he has established insurance.   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   Return in about 2 months (around 02/06/2024) for atopic  dermatitis .  I, Anner Bars, CMA, am acting as scribe for Kirk Sampley K, PA-C.   Documentation: I have reviewed the above documentation for accuracy and completeness, and I agree with the above.  Keesha Pellum K, PA-C

## 2023-12-15 ENCOUNTER — Other Ambulatory Visit: Payer: Self-pay

## 2023-12-29 ENCOUNTER — Other Ambulatory Visit: Payer: Self-pay | Admitting: Nurse Practitioner

## 2023-12-29 ENCOUNTER — Other Ambulatory Visit: Payer: Self-pay

## 2023-12-29 DIAGNOSIS — J4552 Severe persistent asthma with status asthmaticus: Secondary | ICD-10-CM

## 2024-01-06 ENCOUNTER — Other Ambulatory Visit: Payer: Self-pay

## 2024-01-08 ENCOUNTER — Other Ambulatory Visit: Payer: Self-pay

## 2024-01-20 ENCOUNTER — Other Ambulatory Visit: Payer: Self-pay

## 2024-02-10 ENCOUNTER — Ambulatory Visit (INDEPENDENT_AMBULATORY_CARE_PROVIDER_SITE_OTHER): Payer: Self-pay | Admitting: Physician Assistant

## 2024-02-10 ENCOUNTER — Other Ambulatory Visit: Payer: Self-pay

## 2024-02-10 VITALS — BP 155/96

## 2024-02-10 DIAGNOSIS — L209 Atopic dermatitis, unspecified: Secondary | ICD-10-CM

## 2024-02-10 MED ORDER — CLOBETASOL PROPIONATE 0.05 % EX OINT
1.0000 | TOPICAL_OINTMENT | Freq: Two times a day (BID) | CUTANEOUS | 2 refills | Status: DC
  Filled 2024-02-10 – 2024-06-14 (×5): qty 60, 30d supply, fill #0

## 2024-02-10 MED ORDER — HYDROCORTISONE 2.5 % EX OINT
TOPICAL_OINTMENT | Freq: Two times a day (BID) | CUTANEOUS | 2 refills | Status: DC
  Filled 2024-02-10 – 2024-06-14 (×3): qty 60, 30d supply, fill #0

## 2024-02-10 MED ORDER — TRIAMCINOLONE ACETONIDE 0.1 % EX OINT
1.0000 | TOPICAL_OINTMENT | Freq: Two times a day (BID) | CUTANEOUS | 1 refills | Status: AC
  Filled 2024-02-10 – 2024-06-14 (×2): qty 454, 30d supply, fill #0

## 2024-02-10 NOTE — Progress Notes (Signed)
   Follow-Up Visit   Subjective  Adrian [name] is a 48 y.o. male who presents for the following: FOLLOW UP Atopic Dermatitis of hands and arms - He is using clobetasol  ointment 2-3 times daily. He is out of [facility] but we sent in cream and he prefers ointment. He is using HC 2.5% ointment for his face. He does not feel he has gotten much better. He still does not have any insurance.    The following portions of the chart were reviewed this encounter and updated as appropriate: medications, allergies, medical history  Review of Systems:  No other skin or systemic complaints except as noted in HPI or Assessment and Plan.  Objective  Well appearing patient in no apparent distress; mood and affect are within normal limits.  Areas Examined: Face, hands, arms  Relevant physical exam findings are noted in the Assessment and Plan.    Assessment & Plan   ATOPIC DERMATITIS, UNSPECIFIED TYPE     ATOPIC DERMATITIS - INADEQUATELY CONTROLLED   Exam: Scaly patches and plaques and fissures  10 % BSA  Chronic and persistent condition with duration or expected duration over one year. Condition is symptomatic/ bothersome to patient. Not currently at goal.   Atopic dermatitis (eczema) is a chronic, relapsing, pruritic condition that can significantly affect quality of life. It is often associated with allergic rhinitis and/or asthma and can require treatment with topical medications, phototherapy, or in severe cases biologic injectable medication (Dupixent; Adbry) or Oral JAK inhibitors.  Treatment Plan: Continue Hydrocortisone  2.5% ointment to affected areas of face twice daily  Continue clobetasol  ointment twice daily to more severe areas of eczema   Continue TMC 0.1% ointment twice daily to less severe areas of eczema  Recommend gentle skin care.   No follow-ups on file.  I, Roseline Hutchinson, CMA, am acting as scribe for Mavi Un K, PA-C .   Documentation: I have reviewed the  above documentation for accuracy and completeness, and I agree with the above.  Nial Hawe K, PA-C

## 2024-02-11 ENCOUNTER — Other Ambulatory Visit: Payer: Self-pay

## 2024-02-16 ENCOUNTER — Other Ambulatory Visit: Payer: Self-pay

## 2024-02-19 ENCOUNTER — Other Ambulatory Visit: Payer: Self-pay

## 2024-02-22 ENCOUNTER — Other Ambulatory Visit: Payer: Self-pay

## 2024-04-13 ENCOUNTER — Other Ambulatory Visit: Payer: Self-pay | Admitting: Physician Assistant

## 2024-04-13 ENCOUNTER — Other Ambulatory Visit: Payer: Self-pay | Admitting: Nurse Practitioner

## 2024-04-13 ENCOUNTER — Other Ambulatory Visit: Payer: Self-pay

## 2024-04-13 DIAGNOSIS — F32A Depression, unspecified: Secondary | ICD-10-CM

## 2024-04-13 DIAGNOSIS — J4552 Severe persistent asthma with status asthmaticus: Secondary | ICD-10-CM

## 2024-05-16 ENCOUNTER — Other Ambulatory Visit: Payer: Self-pay

## 2024-06-14 ENCOUNTER — Other Ambulatory Visit: Payer: Self-pay

## 2024-06-29 ENCOUNTER — Other Ambulatory Visit (HOSPITAL_COMMUNITY)
Admission: RE | Admit: 2024-06-29 | Discharge: 2024-06-29 | Disposition: A | Discharge status: Home / Self Care | Payer: Self-pay | Source: Ambulatory Visit | Attending: Physician Assistant | Admitting: Physician Assistant

## 2024-06-29 ENCOUNTER — Ambulatory Visit: Payer: Self-pay | Admitting: Physician Assistant

## 2024-06-29 ENCOUNTER — Other Ambulatory Visit: Payer: Self-pay

## 2024-06-29 ENCOUNTER — Encounter: Payer: Self-pay | Admitting: Physician Assistant

## 2024-06-29 VITALS — BP 141/96 | HR 72 | Ht 71.0 in | Wt 251.0 lb

## 2024-06-29 DIAGNOSIS — K219 Gastro-esophageal reflux disease without esophagitis: Secondary | ICD-10-CM

## 2024-06-29 DIAGNOSIS — Z113 Encounter for screening for infections with a predominantly sexual mode of transmission: Secondary | ICD-10-CM | POA: Insufficient documentation

## 2024-06-29 DIAGNOSIS — Z8709 Personal history of other diseases of the respiratory system: Secondary | ICD-10-CM

## 2024-06-29 DIAGNOSIS — Z114 Encounter for screening for human immunodeficiency virus [HIV]: Secondary | ICD-10-CM

## 2024-06-29 DIAGNOSIS — R1013 Epigastric pain: Secondary | ICD-10-CM

## 2024-06-29 DIAGNOSIS — J302 Other seasonal allergic rhinitis: Secondary | ICD-10-CM

## 2024-06-29 DIAGNOSIS — R42 Dizziness and giddiness: Secondary | ICD-10-CM

## 2024-06-29 DIAGNOSIS — E559 Vitamin D deficiency, unspecified: Secondary | ICD-10-CM

## 2024-06-29 DIAGNOSIS — F5104 Psychophysiologic insomnia: Secondary | ICD-10-CM

## 2024-06-29 DIAGNOSIS — F32A Depression, unspecified: Secondary | ICD-10-CM

## 2024-06-29 DIAGNOSIS — J4552 Severe persistent asthma with status asthmaticus: Secondary | ICD-10-CM

## 2024-06-29 DIAGNOSIS — R7303 Prediabetes: Secondary | ICD-10-CM | POA: Insufficient documentation

## 2024-06-29 DIAGNOSIS — H547 Unspecified visual loss: Secondary | ICD-10-CM

## 2024-06-29 DIAGNOSIS — L301 Dyshidrosis [pompholyx]: Secondary | ICD-10-CM

## 2024-06-29 DIAGNOSIS — R110 Nausea: Secondary | ICD-10-CM

## 2024-06-29 DIAGNOSIS — I10 Essential (primary) hypertension: Secondary | ICD-10-CM | POA: Insufficient documentation

## 2024-06-29 DIAGNOSIS — F419 Anxiety disorder, unspecified: Secondary | ICD-10-CM

## 2024-06-29 DIAGNOSIS — Z13220 Encounter for screening for lipoid disorders: Secondary | ICD-10-CM

## 2024-06-29 LAB — POCT GLYCOSYLATED HEMOGLOBIN (HGB A1C)
HbA1c POC (<> result, manual entry): 5.6 %
HbA1c, POC (controlled diabetic range): 5.6 % (ref 0.0–7.0)
HbA1c, POC (prediabetic range): 5.6 % — AB (ref 5.7–6.4)
Hemoglobin A1C: 5.6 % (ref 4.0–5.6)

## 2024-06-29 MED ORDER — HYDROCORTISONE 2.5 % EX OINT
TOPICAL_OINTMENT | Freq: Two times a day (BID) | CUTANEOUS | 2 refills | Status: AC
  Filled 2024-06-29: qty 60, 30d supply, fill #0

## 2024-06-29 MED ORDER — ONDANSETRON 8 MG PO TBDP
8.0000 mg | ORAL_TABLET | Freq: Three times a day (TID) | ORAL | 0 refills | Status: AC | PRN
  Filled 2024-06-29: qty 30, 10d supply, fill #0

## 2024-06-29 MED ORDER — PANTOPRAZOLE SODIUM 40 MG PO TBEC
40.0000 mg | DELAYED_RELEASE_TABLET | Freq: Every day | ORAL | 1 refills | Status: AC
  Filled 2024-06-29: qty 30, 30d supply, fill #0

## 2024-06-29 MED ORDER — ALBUTEROL SULFATE HFA 108 (90 BASE) MCG/ACT IN AERS
2.0000 | INHALATION_SPRAY | RESPIRATORY_TRACT | 1 refills | Status: AC | PRN
  Filled 2024-06-29 – 2024-07-08 (×2): qty 6.7, 17d supply, fill #0

## 2024-06-29 MED ORDER — TRAZODONE HCL 50 MG PO TABS
50.0000 mg | ORAL_TABLET | Freq: Every evening | ORAL | 1 refills | Status: AC | PRN
  Filled 2024-06-29: qty 30, 30d supply, fill #0

## 2024-06-29 MED ORDER — CETIRIZINE HCL 10 MG PO TABS
10.0000 mg | ORAL_TABLET | Freq: Every day | ORAL | 1 refills | Status: AC
  Filled 2024-06-29: qty 30, 30d supply, fill #0

## 2024-06-29 MED ORDER — CLOBETASOL PROPIONATE 0.05 % EX OINT
1.0000 | TOPICAL_OINTMENT | Freq: Two times a day (BID) | CUTANEOUS | 2 refills | Status: AC
  Filled 2024-06-29: qty 15, 8d supply, fill #0

## 2024-06-29 MED ORDER — HYDROCHLOROTHIAZIDE 12.5 MG PO TABS
12.5000 mg | ORAL_TABLET | Freq: Every day | ORAL | 1 refills | Status: AC
  Filled 2024-06-29: qty 30, 30d supply, fill #0

## 2024-06-29 MED ORDER — HYDROXYZINE HCL 25 MG PO TABS
25.0000 mg | ORAL_TABLET | Freq: Three times a day (TID) | ORAL | 6 refills | Status: AC | PRN
  Filled 2024-06-29: qty 60, 20d supply, fill #0

## 2024-06-29 MED ORDER — MECLIZINE HCL 25 MG PO TABS
25.0000 mg | ORAL_TABLET | Freq: Three times a day (TID) | ORAL | 2 refills | Status: AC
  Filled 2024-06-29: qty 30, 10d supply, fill #0

## 2024-06-29 NOTE — Patient Instructions (Addendum)
 VISIT SUMMARY:  During your visit, we discussed your ongoing symptoms of nausea, headaches, and dizziness, as well as your elevated blood pressure readings. We also reviewed your history of anxiety, insomnia, and other health concerns. Several new prescriptions were provided, and we discussed lifestyle changes to help manage your conditions.  YOUR PLAN:  -CHRONIC NAUSEA, DIZZINESS, AND HEADACHE: Your chronic nausea, dizziness, and headaches may be related to vision issues, high blood pressure, or metabolic causes. We have prescribed Zofran  for nausea and meclizine  for dizziness. Blood work has been ordered to evaluate your metabolism, and a vision check is recommended.  -ESSENTIAL HYPERTENSION: You have consistently high blood pressure readings. We have prescribed hydrochlorothiazide, a mild diuretic, to help manage your blood pressure. Please take it in the morning and drink at least four bottles of water daily.  -GASTROESOPHAGEAL REFLUX DISEASE: You have chronic heartburn and acid reflux. We have prescribed pantoprazole  to be taken once daily to manage these symptoms.  -GENERALIZED ANXIETY DISORDER AND INSOMNIA: You have anxiety and trouble sleeping, and you use marijuana to help with sleep. We discussed stopping marijuana use. We have prescribed trazodone  for sleep and hydroxyzine  for anxiety.  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 We have prescribed hydrocortisone  ointment to help manage this condition.  -POOR VISION: Your dizziness and headaches may be related to vision problems. We recommend getting a vision check.  -VITAMIN D  DEFICIENCY: Low vitamin D  levels can contribute to headaches and feeling unwell. We have checked your vitamin D  levels and recommend taking a daily multivitamin.  -PREDIABETES: Your A1c level is 5.6, which indicates prediabetes. This means your blood sugar levels are higher than normal but not high enough to  be classified as diabetes.  -SEVERE PERSISTENT ASTHMA WITH STATUS ASTHMATICUS: You have severe asthma that requires regular treatment. We have prescribed an ubrelvy inhaler to help manage your asthma.  -SEASONAL ALLERGIC RHINITIS: You have chronic sneezing and allergy symptoms. We have prescribed Zyrtec  to be taken once daily to manage these symptoms.  How to Take Your Blood Pressure Blood pressure is a measurement of how strongly your blood is pressing against the walls of your arteries. Arteries are blood vessels that carry blood from your heart throughout your body. Your health care provider takes your blood pressure at each office visit. You can also take your own blood pressure at home with a blood pressure monitor. You may need to take your own blood pressure to: Confirm a diagnosis of high blood pressure (hypertension). Monitor your blood pressure over time. Make sure your blood pressure medicine is working. Supplies needed: Blood pressure monitor. A chair to sit in. This should be a chair where you can sit upright with your back supported. Do not sit on a soft couch or an armchair. Table or desk. Small notebook and pencil or pen. How to prepare To get the most accurate reading, avoid the following for 30 minutes before you check your blood pressure: Drinking caffeine. Drinking alcohol. Eating. Smoking. Exercising. Five minutes before you check your blood pressure: Use the bathroom and urinate so that you have an empty bladder. Sit quietly in a chair. Do not talk. How to take your blood pressure To check your blood pressure, follow the instructions in the manual that came with your blood pressure monitor. If you have a digital blood pressure monitor, the instructions may be as follows: Sit up straight in a chair. Place your feet on the  floor. Do not cross your ankles or legs. Rest your left arm at the level of your heart on a table or desk or on the arm of a chair. Pull up your  shirt sleeve. Wrap the blood pressure cuff around the upper part of your left arm, 1 inch (2.5 cm) above your elbow. It is best to wrap the cuff around bare skin. Fit the cuff snugly, but not too tightly, around your arm. You should be able to place only one finger between the cuff and your arm. Position the cord so that it rests in the bend of your elbow. Press the power button. Sit quietly while the cuff inflates and deflates. Read the digital reading on the monitor screen and write the numbers down (record them) in a notebook. Wait 2-3 minutes, then repeat the steps, starting at step 1. What does my blood pressure reading mean? A blood pressure reading consists of a higher number over a lower number. Ideally, your blood pressure should be below 120/80. The first (top) number is called the systolic pressure. It is a measure of the pressure in your arteries as your heart beats. The second (bottom) number is called the diastolic pressure. It is a measure of the pressure in your arteries as the heart relaxes. Blood pressure is classified into four stages. The following are the stages for adults who do not have a short-term serious illness or a chronic condition. Systolic pressure and diastolic pressure are measured in a unit called mm Hg (millimeters of mercury).  Normal Systolic pressure: below 120. Diastolic pressure: below 80. Elevated Systolic pressure: 120-129. Diastolic pressure: below 80. Hypertension stage 1 Systolic pressure: 130-139. Diastolic pressure: 80-89. Hypertension stage 2 Systolic pressure: 140 or above. Diastolic pressure: 90 or above. You can have elevated blood pressure or hypertension even if only the systolic or only the diastolic number in your reading is higher than normal. Follow these instructions at home: Medicines Take over-the-counter and prescription medicines only as told by your health care provider. Tell your health care provider if you are having any  side effects from blood pressure medicine. General instructions Check your blood pressure as often as recommended by your health care provider. Check your blood pressure at the same time every day. Take your monitor to the next appointment with your health care provider to make sure that: You are using it correctly. It provides accurate readings. Understand what your goal blood pressure numbers are. Keep all follow-up visits. This is important. General tips Your health care provider can suggest a reliable monitor that will meet your needs. There are several types of home blood pressure monitors. Choose a monitor that has an arm cuff. Do not choose a monitor that measures your blood pressure from your wrist or finger. Choose a cuff that wraps snugly, not too tight or too loose, around your upper arm. You should be able to fit only one finger between your arm and the cuff. You can buy a blood pressure monitor at most drugstores or online. Where to find more information American Heart Association: www.heart.org Contact a health care provider if: Your blood pressure is consistently high. Your blood pressure is suddenly low. Get help right away if: Your systolic blood pressure is higher than 180. Your diastolic blood pressure is higher than 120. These symptoms may be an emergency. Get help right away. Call 911. Do not wait to see if the symptoms will go away. Do not drive yourself to the hospital. Summary Blood pressure  is a measurement of how strongly your blood is pressing against the walls of your arteries. A blood pressure reading consists of a higher number over a lower number. Ideally, your blood pressure should be below 120/80. Check your blood pressure at the same time every day. Avoid caffeine, alcohol, smoking, and exercise for 30 minutes prior to checking your blood pressure. These agents can affect the accuracy of the blood pressure reading. This information is not intended to  replace advice given to you by your health care provider. Make sure you discuss any questions you have with your health care provider. Document Revised: 02/28/2021 Document Reviewed: 02/28/2021 Elsevier Patient Education  2024 [employer].

## 2024-06-29 NOTE — Progress Notes (Signed)
 "  Established Patient Office Visit  Subjective   Patient ID: Adrian [name], male    DOB: 1975/07/21  Age: 48 y.o. MRN: [id number]  Chief Complaint  Patient presents with   Medication Refill   Prediabetes    Pt wants his A1C checked, states has had nausea with headaches   Discussed the use of AI scribe software for clinical note transcription with the patient, who gave verbal consent to proceed.  History of Present Illness   Adrian [name] is a 48 year old male who presents with nausea, headaches, and dizziness.  He describes long-standing severe nausea with frequent dizziness and headaches. Nausea is worsened by riding in a car and is associated with a spinning sensation that often confines him to the couch. Vomiting gives partial relief. He feels that all foods burn his stomach and this has been present for a long time.  He has repeatedly elevated blood pressure readings at recent visits (141/96, 148/100, 155/96). He has never used blood pressure medication and does not check his blood pressure at home. He reports feeling unwell at home as well.  He previously used topical creams from dermatology, including hydrocortisone  and clobetasol , and feels one may have caused gritty eye symptoms. He is not using these now.  Prior Zofran  helped his nausea. Meclizine  was less effective for dizziness. He currently uses trazodone  for sleep and hydroxyzine  for anxiety.  He was treated for trichomonas about two years ago and is worried about recurrence because of similar symptoms and ongoing exposure. He however denies any known exposure, discharge, lesions or sores  He uses marijuana about a joint every three to four days and thinks it might contribute to his symptoms. He has anxiety with intrusive voices and songs in his head that disrupt his sleep.    Past Medical History:  Diagnosis Date   Asthma    at birth   Eczema    Environmental allergies    grass   Foot fracture, left    Heart  murmur    Migraine    Multiple food allergies    fish, tomatoes, peanuts, eggs, chicken and others   Social History   Socioeconomic History   Marital status: Single    Spouse name: Not on file   Number of children: Not on file   Years of education: Not on file   Highest education level: Not on file  Occupational History   Not on file  Tobacco Use   Smoking status: Never   Smokeless tobacco: Never  Vaping Use   Vaping status: Never Used  Substance and Sexual Activity   Alcohol use: Never   Drug use: Yes    Types: Marijuana   Sexual activity: Yes  Other Topics Concern   Not on file  Social History Narrative   ** Merged History Encounter **       Currently unemployed   1 son   Incarcerated on drug charges in 2010 for ~1 year.    Social Drivers of Health   Tobacco Use: Low Risk (06/29/2024)   Patient History    Smoking Tobacco Use: Never    Smokeless Tobacco Use: Never    Passive Exposure: Not on file  Financial Resource Strain: Not on file  Food Insecurity: Food Insecurity Present (06/29/2024)   Epic    Worried About Programme Researcher, Broadcasting/film/video in the Last Year: Sometimes true    Ran Out of Food in the Last Year: Sometimes true  Transportation Needs: No Transportation Needs (  06/29/2024)   Epic    Lack of Transportation (Medical): No    Lack of Transportation (Non-Medical): No  Physical Activity: Not on file  Stress: Not on file  Social Connections: Not on file  Intimate Partner Violence: Not At Risk (06/29/2024)   Epic    Fear of Current or Ex-Partner: No    Emotionally Abused: No    Physically Abused: No    Sexually Abused: No  Depression (PHQ2-9): High Risk (06/29/2024)   Depression (PHQ2-9)    PHQ-2 Score: 17  Alcohol Screen: Not on file  Housing: Low Risk (06/29/2024)   Epic    Unable to Pay for Housing in the Last Year: No    Number of Times Moved in the Last Year: 0    Homeless in the Last Year: No  Utilities: Not At Risk (06/29/2024)   Epic     Threatened with loss of utilities: No  Health Literacy: Not on file   Family History  Problem Relation Age of Onset   Asthma Father    Diabetes Mother    Hypertension Mother    Clotting disorder Other        Aunt   Breast cancer Other        Grandmother   Prostate cancer Other        uncle   Hyperlipidemia Other        grandparent   Hypertension Other        grandparent/grandparent   Kidney disease Other        aunt/uncle/grandparent   Stroke Other        grandparent   Allergies[1]  Review of Systems  Constitutional:  Negative for chills and fever.  HENT: Negative.    Eyes:  Positive for blurred vision.  Respiratory:  Negative for shortness of breath and wheezing.   Cardiovascular:  Negative for chest pain.  Gastrointestinal:  Positive for heartburn, nausea and vomiting.  Genitourinary: Negative.   Musculoskeletal: Negative.   Skin:  Positive for rash.  Neurological:  Positive for dizziness and headaches.  Endo/Heme/Allergies: Negative.   Psychiatric/Behavioral:  The patient is nervous/anxious and has insomnia.       Objective:     BP (!) 141/96 (BP Location: Left Arm, Patient Position: Sitting, Cuff Size: Large)   Pulse 72   Ht 5' 11 (1.803 m)   Wt 251 lb (113.9 kg)   SpO2 97%   BMI 35.01 kg/m  BP Readings from Last 3 Encounters:  06/29/24 (!) 141/96  02/10/24 (!) 155/96  12/07/23 (!) 148/100   Wt Readings from Last 3 Encounters:  06/29/24 251 lb (113.9 kg)  09/03/23 254 lb (115.2 kg)  02/05/23 254 lb (115.2 kg)    Physical Exam Vitals and nursing note reviewed.  Constitutional:      Appearance: Normal appearance.  HENT:     Head: Normocephalic and atraumatic.     Right Ear: External ear normal.     Left Ear: External ear normal.     Nose: Nose normal.     Mouth/Throat:     Mouth: Mucous membranes are moist.     Pharynx: Oropharynx is clear.  Eyes:     Extraocular Movements: Extraocular movements intact.     Conjunctiva/sclera:  Conjunctivae normal.     Pupils: Pupils are equal, round, and reactive to light.  Cardiovascular:     Rate and Rhythm: Normal rate and regular rhythm.     Pulses: Normal pulses.     Heart sounds: Normal  heart sounds.  Pulmonary:     Effort: Pulmonary effort is normal.     Breath sounds: Normal breath sounds. No wheezing.  Musculoskeletal:        General: Normal range of motion.     Cervical back: Normal range of motion and neck supple.  Skin:    General: Skin is warm and dry.     Comments: See photos  Neurological:     General: No focal deficit present.     Mental Status: He is alert and oriented to person, place, and time.  Psychiatric:        Mood and Affect: Mood normal.        Behavior: Behavior normal.        Thought Content: Thought content normal.        Judgment: Judgment normal.           Assessment & Plan:   Problem List Items Addressed This Visit       Respiratory   Severe persistent asthma with status asthmaticus (HCC)   Relevant Medications   albuterol  (VENTOLIN  HFA) 108 (90 Base) MCG/ACT inhaler     Digestive   Gastroesophageal reflux disease without esophagitis   Relevant Medications   ondansetron  (ZOFRAN -ODT) 8 MG disintegrating tablet   pantoprazole  (PROTONIX ) 40 MG tablet   meclizine  (ANTIVERT ) 25 MG tablet     Musculoskeletal and Integument   Eczema, dyshidrotic (Chronic)   Relevant Medications   clobetasol  ointment (TEMOVATE ) 0.05 %   hydrocortisone  2.5 % ointment     Other   Poor vision   Nausea   Relevant Medications   ondansetron  (ZOFRAN -ODT) 8 MG disintegrating tablet   meclizine  (ANTIVERT ) 25 MG tablet   Epigastric pain   Anxiety state   Relevant Medications   hydrOXYzine  (ATARAX ) 25 MG tablet   traZODone  (DESYREL ) 50 MG tablet   Anxiety and depression   Relevant Medications   hydrOXYzine  (ATARAX ) 25 MG tablet   traZODone  (DESYREL ) 50 MG tablet   Vitamin D  deficiency   Psychophysiological insomnia   Relevant Medications    traZODone  (DESYREL ) 50 MG tablet   Other Visit Diagnoses       Prediabetes    -  Primary   Relevant Orders   HgB A1c (Completed)     Dizziness       Relevant Medications   meclizine  (ANTIVERT ) 25 MG tablet   Other Relevant Orders   TSH     Essential hypertension       Relevant Medications   hydrochlorothiazide (HYDRODIURIL) 12.5 MG tablet   Other Relevant Orders   CBC with Differential/Platelet   Comp. Metabolic Panel (12)     Screening, lipid       Relevant Orders   Lipid panel     Screening for HIV (human immunodeficiency virus)       Relevant Orders   HIV antibody (with reflex)     Screen for sexually transmitted diseases       Relevant Orders   RPR   Urine cytology ancillary only     Seasonal allergies       Relevant Medications   cetirizine  (ZYRTEC ) 10 MG tablet      Assessment and Plan Chronic nausea, dizziness, and headache evaluation Chronic nausea, dizziness, and headaches possibly related to vision issues, hypertension, or metabolic causes. Zofran  effective for nausea and dizziness. - Ordered blood work for metabolic evaluation. - Recommended vision check. - Prescribed Zofran  for nausea. - Prescribed meclizine  for dizziness.  Essential hypertension  Consistently elevated blood pressure readings. Discussed hydrochlorothiazide as a mild diuretic to manage blood pressure. - Prescribed hydrochlorothiazide  - Advised drinking at least four bottles of water daily. The patient was given clear instructions to go to ER or return to medical center if symptoms don't improve, worsen or new problems develop. The patient verbalized understanding.     Gastroesophageal reflux disease Chronic heartburn and acid reflux managed effectively with pantoprazole . - Prescribed pantoprazole  once daily.  Generalized anxiety disorder and insomnia Anxiety and insomnia with marijuana use for sleep. Discussed cessation of marijuana. - Prescribed trazodone  for sleep. - Prescribed  hydroxyzine  for anxiety. - Advised cessation of marijuana use.  Dyshidrotic eczema - Prescribed hydrocortisone  ointment 2%.  Poor vision Potential contributor to dizziness and headaches. No recent eye examination. - Recommended vision check.  Vitamin D  deficiency Low vitamin D  levels possibly contributing to headaches and malaise. - Checked vitamin D  levels. - Recommended daily multivitamin.  Prediabetes A1c at 5.6 indicating prediabetes.  Asthma  - Resume Albuterol  PRN.  Seasonal allergic rhinitis Chronic sneezing and allergy symptoms managed with Zyrtec . - Prescribed Zyrtec  once daily.  General health maintenance Discussed hydration and multivitamin use. - Advised drinking at least four bottles of water daily. - Recommended daily multivitamin.   I have reviewed the patient's medical history (PMH, PSH, Social History, Family History, Medications, and allergies) , and have been updated if relevant. I spent 30 minutes reviewing chart and  face to face time with patient.     Return in about 2 weeks (around 07/13/2024) for With [facility].    Eveleen Mcnear S Mayers, PA-C      [1]  Allergies Allergen Reactions   Apple Juice Shortness Of Breath, Itching, Nausea Only and Swelling    Tongue itches also   Atrovent  [Ipratropium] Anaphylaxis    Atrovent  contains peanut oil and PT has a HX of anaphlaxis to peanuts   Banana Shortness Of Breath and Itching    Makes tongue itch   Carrot Oil Shortness Of Breath and Itching   Fish Allergy Anaphylaxis, Hives and Swelling   Fruit & Vegetable Daily [Nutritional Supplements] Shortness Of Breath and Itching    Patient stated that he allergic to most all fruits   Peanut-Containing Drug Products Anaphylaxis, Hives, Nausea And Vomiting and Swelling   Shellfish Allergy Anaphylaxis, Hives, Swelling and Other (See Comments)    ALL SEAFOOD   Tomato Anaphylaxis, Hives and Swelling   Chicken Meat (Diagnostic) Other (See Comments)    Tested allergic  to this years ago   Latex Itching    No powdered gloves!!   Porcine (Pork) Protein-Containing Drug Products Other (See Comments)    CAUSES SEVERE HEADACHES   Soy Allergy (Obsolete) Other (See Comments)    Tested allergic to this years ago   Grass Extracts [Gramineae Pollens] Hives, Rash and Other (See Comments)    Wheezing also   Tree Extract Other (See Comments)    Patient tested allergic to this years ago   "

## 2024-06-30 LAB — COMP. METABOLIC PANEL (12)
AST: 23 IU/L (ref 0–40)
Albumin: 4.4 g/dL (ref 4.1–5.1)
Alkaline Phosphatase: 98 IU/L (ref 47–123)
BUN/Creatinine Ratio: 5 — ABNORMAL LOW (ref 9–20)
BUN: 7 mg/dL (ref 6–24)
Bilirubin Total: 0.4 mg/dL (ref 0.0–1.2)
Calcium: 10.1 mg/dL (ref 8.7–10.2)
Chloride: 102 mmol/L (ref 96–106)
Creatinine, Ser: 1.31 mg/dL — ABNORMAL HIGH (ref 0.76–1.27)
Globulin, Total: 3.2 g/dL (ref 1.5–4.5)
Glucose: 92 mg/dL (ref 70–99)
Potassium: 4.6 mmol/L (ref 3.5–5.2)
Sodium: 140 mmol/L (ref 134–144)
Total Protein: 7.6 g/dL (ref 6.0–8.5)
eGFR: 67 mL/min/1.73

## 2024-06-30 LAB — LIPID PANEL
Chol/HDL Ratio: 4.2 ratio (ref 0.0–5.0)
Cholesterol, Total: 138 mg/dL (ref 100–199)
HDL: 33 mg/dL — ABNORMAL LOW
LDL Chol Calc (NIH): 91 mg/dL (ref 0–99)
Triglycerides: 66 mg/dL (ref 0–149)
VLDL Cholesterol Cal: 14 mg/dL (ref 5–40)

## 2024-06-30 LAB — CBC WITH DIFFERENTIAL/PLATELET
Basophils Absolute: 0 x10E3/uL (ref 0.0–0.2)
Basos: 0 %
EOS (ABSOLUTE): 0.4 x10E3/uL (ref 0.0–0.4)
Eos: 6 %
Hematocrit: 48 % (ref 37.5–51.0)
Hemoglobin: 15.3 g/dL (ref 13.0–17.7)
Immature Grans (Abs): 0 x10E3/uL (ref 0.0–0.1)
Immature Granulocytes: 0 %
Lymphocytes Absolute: 2.7 x10E3/uL (ref 0.7–3.1)
Lymphs: 35 %
MCH: 26.4 pg — ABNORMAL LOW (ref 26.6–33.0)
MCHC: 31.9 g/dL (ref 31.5–35.7)
MCV: 83 fL (ref 79–97)
Monocytes Absolute: 0.6 x10E3/uL (ref 0.1–0.9)
Monocytes: 7 %
Neutrophils Absolute: 4.1 x10E3/uL (ref 1.4–7.0)
Neutrophils: 52 %
Platelets: 312 x10E3/uL (ref 150–450)
RBC: 5.8 x10E6/uL (ref 4.14–5.80)
RDW: 13.5 % (ref 11.6–15.4)
WBC: 7.9 x10E3/uL (ref 3.4–10.8)

## 2024-06-30 LAB — TSH: TSH: 1.63 u[IU]/mL (ref 0.450–4.500)

## 2024-06-30 LAB — SYPHILIS: RPR W/REFLEX TO RPR TITER AND TREPONEMAL ANTIBODIES, TRADITIONAL SCREENING AND DIAGNOSIS ALGORITHM: RPR Ser Ql: NONREACTIVE

## 2024-06-30 LAB — HIV ANTIBODY (ROUTINE TESTING W REFLEX): HIV Screen 4th Generation wRfx: NONREACTIVE

## 2024-07-01 LAB — URINE CYTOLOGY ANCILLARY ONLY
Chlamydia: NEGATIVE
Comment: NEGATIVE
Comment: NEGATIVE
Comment: NORMAL
Neisseria Gonorrhea: NEGATIVE
Trichomonas: NEGATIVE

## 2024-07-04 ENCOUNTER — Ambulatory Visit: Payer: Self-pay | Admitting: Physician Assistant

## 2024-07-04 NOTE — Progress Notes (Signed)
 VM left on private phone line with lab results

## 2024-07-08 ENCOUNTER — Other Ambulatory Visit: Payer: Self-pay

## 2024-07-11 ENCOUNTER — Telehealth: Payer: Self-pay | Admitting: Physician Assistant

## 2024-07-11 NOTE — Telephone Encounter (Signed)
 Contacted pt to schedule follow up visit. Pt stated he will try to come by tomorrow (01\13) but not sure what time due to transportation issues.

## 2024-07-12 ENCOUNTER — Other Ambulatory Visit: Payer: Self-pay
